# Patient Record
Sex: Male | Born: 1945
Health system: Southern US, Community
[De-identification: ages and names within clinical notes are randomized; demographics above are authoritative.]

## PROBLEM LIST (undated history)

## (undated) DIAGNOSIS — Z9289 Personal history of other medical treatment: Secondary | ICD-10-CM

## (undated) DIAGNOSIS — H269 Unspecified cataract: Secondary | ICD-10-CM

## (undated) DIAGNOSIS — R112 Nausea with vomiting, unspecified: Secondary | ICD-10-CM

## (undated) DIAGNOSIS — K5732 Diverticulitis of large intestine without perforation or abscess without bleeding: Secondary | ICD-10-CM

## (undated) DIAGNOSIS — H209 Unspecified iridocyclitis: Secondary | ICD-10-CM

## (undated) DIAGNOSIS — Z9889 Other specified postprocedural states: Secondary | ICD-10-CM

## (undated) DIAGNOSIS — R002 Palpitations: Secondary | ICD-10-CM

## (undated) DIAGNOSIS — N529 Male erectile dysfunction, unspecified: Secondary | ICD-10-CM

## (undated) DIAGNOSIS — Z8679 Personal history of other diseases of the circulatory system: Secondary | ICD-10-CM

## (undated) DIAGNOSIS — IMO0001 Reserved for inherently not codable concepts without codable children: Secondary | ICD-10-CM

## (undated) DIAGNOSIS — M1990 Unspecified osteoarthritis, unspecified site: Secondary | ICD-10-CM

## (undated) DIAGNOSIS — E785 Hyperlipidemia, unspecified: Secondary | ICD-10-CM

## (undated) DIAGNOSIS — H20029 Recurrent acute iridocyclitis, unspecified eye: Secondary | ICD-10-CM

## (undated) DIAGNOSIS — I10 Essential (primary) hypertension: Secondary | ICD-10-CM

## (undated) HISTORY — DX: Unspecified osteoarthritis, unspecified site: M19.90

## (undated) HISTORY — DX: Other specified postprocedural states: R11.2

## (undated) HISTORY — PX: APPENDECTOMY: SHX54

## (undated) HISTORY — DX: Palpitations: R00.2

## (undated) HISTORY — PX: TONSILLECTOMY: SUR1361

## (undated) HISTORY — DX: Unspecified iridocyclitis: H20.9

## (undated) HISTORY — DX: Unspecified cataract: H26.9

## (undated) HISTORY — PX: TONSILLECTOMY AND ADENOIDECTOMY: SHX28

## (undated) HISTORY — PX: OTHER SURGICAL HISTORY: SHX169

## (undated) HISTORY — DX: Reserved for inherently not codable concepts without codable children: IMO0001

## (undated) HISTORY — DX: Hyperlipidemia, unspecified: E78.5

## (undated) HISTORY — PX: SHOULDER ARTHROSCOPY: SHX128

## (undated) HISTORY — DX: Essential (primary) hypertension: I10

## (undated) HISTORY — DX: Personal history of other medical treatment: Z92.89

## (undated) HISTORY — DX: Male erectile dysfunction, unspecified: N52.9

## (undated) HISTORY — DX: Personal history of other diseases of the circulatory system: Z86.79

## (undated) HISTORY — PX: CATARACT EXTRACTION: SUR2

## (undated) HISTORY — DX: Diverticulitis of large intestine without perforation or abscess without bleeding: K57.32

## (undated) HISTORY — DX: Other specified postprocedural states: Z98.890

## (undated) HISTORY — DX: Recurrent acute iridocyclitis, unspecified eye: H20.029

---

## 1998-12-17 ENCOUNTER — Encounter: Payer: Self-pay | Admitting: Specialist

## 1998-12-17 ENCOUNTER — Ambulatory Visit (HOSPITAL_COMMUNITY): Admission: RE | Admit: 1998-12-17 | Discharge: 1998-12-17 | Payer: Self-pay | Admitting: Specialist

## 1999-10-08 ENCOUNTER — Encounter: Admission: RE | Admit: 1999-10-08 | Discharge: 1999-10-08 | Payer: Self-pay | Admitting: Family Medicine

## 1999-10-08 ENCOUNTER — Encounter: Payer: Self-pay | Admitting: Family Medicine

## 2000-03-06 ENCOUNTER — Encounter: Admission: RE | Admit: 2000-03-06 | Discharge: 2000-03-06 | Payer: Self-pay | Admitting: Family Medicine

## 2000-03-06 ENCOUNTER — Encounter: Payer: Self-pay | Admitting: Family Medicine

## 2002-09-27 ENCOUNTER — Encounter: Payer: Self-pay | Admitting: Family Medicine

## 2002-09-27 DIAGNOSIS — IMO0001 Reserved for inherently not codable concepts without codable children: Secondary | ICD-10-CM

## 2002-09-27 HISTORY — DX: Reserved for inherently not codable concepts without codable children: IMO0001

## 2004-09-02 ENCOUNTER — Ambulatory Visit: Payer: Self-pay | Admitting: Family Medicine

## 2004-09-09 ENCOUNTER — Ambulatory Visit: Payer: Self-pay | Admitting: Family Medicine

## 2004-10-29 ENCOUNTER — Ambulatory Visit (HOSPITAL_COMMUNITY): Admission: RE | Admit: 2004-10-29 | Discharge: 2004-10-29 | Payer: Self-pay | Admitting: Gastroenterology

## 2004-12-09 ENCOUNTER — Ambulatory Visit: Payer: Self-pay | Admitting: Family Medicine

## 2004-12-31 ENCOUNTER — Ambulatory Visit: Payer: Self-pay | Admitting: Family Medicine

## 2005-02-23 ENCOUNTER — Ambulatory Visit: Payer: Self-pay | Admitting: Family Medicine

## 2005-03-31 ENCOUNTER — Ambulatory Visit: Payer: Self-pay | Admitting: Family Medicine

## 2005-07-23 ENCOUNTER — Ambulatory Visit: Payer: Self-pay | Admitting: Family Medicine

## 2005-07-26 ENCOUNTER — Ambulatory Visit: Payer: Self-pay | Admitting: Family Medicine

## 2005-09-13 ENCOUNTER — Ambulatory Visit: Payer: Self-pay | Admitting: Family Medicine

## 2005-09-20 ENCOUNTER — Ambulatory Visit: Payer: Self-pay | Admitting: Family Medicine

## 2005-12-19 ENCOUNTER — Ambulatory Visit: Payer: Self-pay | Admitting: Family Medicine

## 2006-04-04 ENCOUNTER — Ambulatory Visit: Payer: Self-pay | Admitting: Family Medicine

## 2006-06-21 ENCOUNTER — Ambulatory Visit: Payer: Self-pay | Admitting: Family Medicine

## 2006-07-18 ENCOUNTER — Ambulatory Visit: Payer: Self-pay | Admitting: Family Medicine

## 2006-08-18 ENCOUNTER — Ambulatory Visit: Payer: Self-pay | Admitting: Family Medicine

## 2006-09-04 ENCOUNTER — Emergency Department (HOSPITAL_COMMUNITY): Admission: EM | Admit: 2006-09-04 | Discharge: 2006-09-04 | Payer: Self-pay | Admitting: Emergency Medicine

## 2006-09-12 LAB — HM COLONOSCOPY: HM Colonoscopy: NORMAL

## 2006-09-20 ENCOUNTER — Ambulatory Visit: Payer: Self-pay | Admitting: Family Medicine

## 2006-09-20 LAB — CONVERTED CEMR LAB
ALT: 65 units/L — ABNORMAL HIGH (ref 0–40)
AST: 39 units/L — ABNORMAL HIGH (ref 0–37)
Albumin: 4.2 g/dL (ref 3.5–5.2)
Alkaline Phosphatase: 68 units/L (ref 39–117)
BUN: 16 mg/dL (ref 6–23)
Basophils Absolute: 0 10*3/uL (ref 0.0–0.1)
Basophils Relative: 0.3 % (ref 0.0–1.0)
CO2: 31 meq/L (ref 19–32)
Calcium: 9 mg/dL (ref 8.4–10.5)
Chloride: 109 meq/L (ref 96–112)
Chol/HDL Ratio, serum: 6.7
Cholesterol: 258 mg/dL (ref 0–200)
Creatinine, Ser: 0.9 mg/dL (ref 0.4–1.5)
Eosinophil percent: 3.9 % (ref 0.0–5.0)
GFR calc non Af Amer: 91 mL/min
Glomerular Filtration Rate, Af Am: 110 mL/min/{1.73_m2}
Glucose, Bld: 140 mg/dL — ABNORMAL HIGH (ref 70–99)
HCT: 47.9 % (ref 39.0–52.0)
HDL: 38.7 mg/dL — ABNORMAL LOW (ref >39.0)
Hemoglobin: 16.2 g/dL (ref 13.0–17.0)
Hgb A1c MFr Bld: 7.1 % — ABNORMAL HIGH (ref 4.6–6.0)
LDL DIRECT: 148.9 mg/dL
Lymphocytes Relative: 29.1 % (ref 12.0–46.0)
MCHC: 33.7 g/dL (ref 30.0–36.0)
MCV: 95.3 fL (ref 78.0–100.0)
Monocytes Absolute: 0.5 10*3/uL (ref 0.2–0.7)
Monocytes Relative: 10.9 % (ref 3.0–11.0)
Neutro Abs: 2.7 10*3/uL (ref 1.4–7.7)
Neutrophils Relative %: 55.8 % (ref 43.0–77.0)
PSA: 0.75 ng/mL (ref 0.10–4.00)
Platelets: 177 10*3/uL (ref 150–400)
Potassium: 4.3 meq/L (ref 3.5–5.1)
RBC: 5.02 M/uL (ref 4.22–5.81)
RDW: 11.9 % (ref 11.5–14.6)
Sodium: 144 meq/L (ref 135–145)
TSH: 1.84 microintl units/mL (ref 0.35–5.50)
Total Bilirubin: 0.7 mg/dL (ref 0.3–1.2)
Total Protein: 6.9 g/dL (ref 6.0–8.3)
Triglyceride fasting, serum: 284 mg/dL (ref 0–149)
VLDL: 57 mg/dL — ABNORMAL HIGH (ref 0–40)
WBC: 4.8 10*3/uL (ref 4.5–10.5)

## 2006-09-27 ENCOUNTER — Ambulatory Visit: Payer: Self-pay | Admitting: Family Medicine

## 2006-10-20 ENCOUNTER — Encounter: Admission: RE | Admit: 2006-10-20 | Discharge: 2007-01-18 | Payer: Self-pay | Admitting: Family Medicine

## 2006-12-08 ENCOUNTER — Ambulatory Visit: Payer: Self-pay | Admitting: Internal Medicine

## 2007-01-22 ENCOUNTER — Ambulatory Visit: Payer: Self-pay | Admitting: Family Medicine

## 2007-01-22 LAB — CONVERTED CEMR LAB
Alkaline Phosphatase: 51 units/L (ref 39–117)
Bilirubin, Direct: 0.2 mg/dL (ref 0.0–0.3)
Cholesterol: 188 mg/dL (ref 0–200)
HDL: 39.4 mg/dL (ref >39.0)
LDL Cholesterol: 124 mg/dL — ABNORMAL HIGH (ref 0–99)
Total CHOL/HDL Ratio: 4.8
Total Protein: 6.4 g/dL (ref 6.0–8.3)

## 2007-03-05 DIAGNOSIS — M109 Gout, unspecified: Secondary | ICD-10-CM | POA: Insufficient documentation

## 2007-03-05 DIAGNOSIS — I10 Essential (primary) hypertension: Secondary | ICD-10-CM | POA: Insufficient documentation

## 2007-03-05 DIAGNOSIS — E785 Hyperlipidemia, unspecified: Secondary | ICD-10-CM | POA: Insufficient documentation

## 2007-03-05 DIAGNOSIS — Z860100 Personal history of colon polyps, unspecified: Secondary | ICD-10-CM | POA: Insufficient documentation

## 2007-03-05 DIAGNOSIS — N138 Other obstructive and reflux uropathy: Secondary | ICD-10-CM | POA: Insufficient documentation

## 2007-03-05 DIAGNOSIS — K5730 Diverticulosis of large intestine without perforation or abscess without bleeding: Secondary | ICD-10-CM | POA: Insufficient documentation

## 2007-03-05 DIAGNOSIS — N401 Enlarged prostate with lower urinary tract symptoms: Secondary | ICD-10-CM

## 2007-04-19 ENCOUNTER — Telehealth: Payer: Self-pay | Admitting: Family Medicine

## 2007-04-20 ENCOUNTER — Ambulatory Visit: Payer: Self-pay | Admitting: Family Medicine

## 2007-04-24 ENCOUNTER — Telehealth: Payer: Self-pay | Admitting: Family Medicine

## 2007-04-24 LAB — CONVERTED CEMR LAB
ALT: 65 units/L — ABNORMAL HIGH (ref 0–53)
AST: 46 units/L — ABNORMAL HIGH (ref 0–37)
Bilirubin, Direct: 0.2 mg/dL (ref 0.0–0.3)
Cholesterol: 169 mg/dL (ref 0–200)
HDL: 38.9 mg/dL — ABNORMAL LOW (ref >39.0)
Total Bilirubin: 1.4 mg/dL — ABNORMAL HIGH (ref 0.3–1.2)
Total Protein: 6.5 g/dL (ref 6.0–8.3)

## 2007-07-19 ENCOUNTER — Ambulatory Visit: Payer: Self-pay | Admitting: Family Medicine

## 2007-09-04 ENCOUNTER — Ambulatory Visit: Payer: Self-pay | Admitting: Family Medicine

## 2007-09-04 LAB — CONVERTED CEMR LAB
Nitrite: NEGATIVE
Urobilinogen, UA: 0.2

## 2007-09-21 ENCOUNTER — Ambulatory Visit: Payer: Self-pay | Admitting: Family Medicine

## 2007-09-21 LAB — CONVERTED CEMR LAB
Blood in Urine, dipstick: NEGATIVE
Ketones, urine, test strip: NEGATIVE
Urobilinogen, UA: 0.2
pH: 6.5

## 2007-09-24 LAB — CONVERTED CEMR LAB
ALT: 75 units/L — ABNORMAL HIGH (ref 0–53)
AST: 48 units/L — ABNORMAL HIGH (ref 0–37)
Alkaline Phosphatase: 67 units/L (ref 39–117)
BUN: 14 mg/dL (ref 6–23)
Basophils Relative: 0.1 % (ref 0.0–1.0)
CO2: 29 meq/L (ref 19–32)
Calcium: 9.3 mg/dL (ref 8.4–10.5)
Chloride: 96 meq/L (ref 96–112)
Cholesterol: 227 mg/dL (ref 0–200)
Creatinine, Ser: 0.8 mg/dL (ref 0.4–1.5)
Hemoglobin: 16.1 g/dL (ref 13.0–17.0)
Monocytes Absolute: 0.6 10*3/uL (ref 0.2–0.7)
Monocytes Relative: 10.5 % (ref 3.0–11.0)
Potassium: 5.1 meq/L (ref 3.5–5.1)
RBC: 4.82 M/uL (ref 4.22–5.81)
RDW: 12.3 % (ref 11.5–14.6)
Total Bilirubin: 1.2 mg/dL (ref 0.3–1.2)
Total CHOL/HDL Ratio: 6.5
Total Protein: 6.6 g/dL (ref 6.0–8.3)
VLDL: 28 mg/dL (ref 0–40)

## 2007-09-27 ENCOUNTER — Ambulatory Visit: Payer: Self-pay | Admitting: Family Medicine

## 2007-12-04 ENCOUNTER — Ambulatory Visit: Payer: Self-pay | Admitting: Family Medicine

## 2007-12-04 LAB — CONVERTED CEMR LAB
Blood in Urine, dipstick: NEGATIVE
Ketones, urine, test strip: NEGATIVE
Nitrite: NEGATIVE
Protein, U semiquant: NEGATIVE
Urobilinogen, UA: 0.2

## 2007-12-10 ENCOUNTER — Telehealth: Payer: Self-pay | Admitting: Family Medicine

## 2007-12-10 ENCOUNTER — Ambulatory Visit: Payer: Self-pay | Admitting: Family Medicine

## 2007-12-10 LAB — CONVERTED CEMR LAB
Bilirubin Urine: NEGATIVE
Glucose, Urine, Semiquant: NEGATIVE
Ketones, urine, test strip: NEGATIVE
Protein, U semiquant: NEGATIVE
Urobilinogen, UA: 0.2
pH: 6.5

## 2007-12-14 ENCOUNTER — Telehealth: Payer: Self-pay | Admitting: Family Medicine

## 2007-12-14 ENCOUNTER — Encounter: Admission: RE | Admit: 2007-12-14 | Discharge: 2007-12-14 | Payer: Self-pay | Admitting: Family Medicine

## 2007-12-20 ENCOUNTER — Ambulatory Visit: Payer: Self-pay | Admitting: Family Medicine

## 2007-12-28 LAB — CONVERTED CEMR LAB
ALT: 43 units/L (ref 0–53)
Albumin: 4.2 g/dL (ref 3.5–5.2)
Alkaline Phosphatase: 60 units/L (ref 39–117)
Cholesterol: 116 mg/dL (ref 0–200)
LDL Cholesterol: 72 mg/dL (ref 0–99)
Total Protein: 6.6 g/dL (ref 6.0–8.3)
Triglycerides: 95 mg/dL (ref 0–149)
VLDL: 19 mg/dL (ref 0–40)

## 2008-04-03 ENCOUNTER — Encounter: Payer: Self-pay | Admitting: Family Medicine

## 2008-04-11 ENCOUNTER — Telehealth: Payer: Self-pay | Admitting: Family Medicine

## 2008-04-14 ENCOUNTER — Ambulatory Visit: Payer: Self-pay | Admitting: Family Medicine

## 2008-05-29 ENCOUNTER — Telehealth: Payer: Self-pay | Admitting: Family Medicine

## 2008-06-01 DIAGNOSIS — M65839 Other synovitis and tenosynovitis, unspecified forearm: Secondary | ICD-10-CM | POA: Insufficient documentation

## 2008-06-01 DIAGNOSIS — M65849 Other synovitis and tenosynovitis, unspecified hand: Secondary | ICD-10-CM

## 2008-06-05 ENCOUNTER — Ambulatory Visit: Payer: Self-pay | Admitting: Family Medicine

## 2008-06-06 ENCOUNTER — Telehealth: Payer: Self-pay | Admitting: Family Medicine

## 2008-06-06 ENCOUNTER — Ambulatory Visit: Payer: Self-pay | Admitting: Family Medicine

## 2008-06-10 LAB — CONVERTED CEMR LAB
AST: 28 units/L (ref 0–37)
Albumin: 4.1 g/dL (ref 3.5–5.2)
Alkaline Phosphatase: 54 units/L (ref 39–117)
Bilirubin, Direct: 0.2 mg/dL (ref 0.0–0.3)
LDL Cholesterol: 101 mg/dL — ABNORMAL HIGH (ref 0–99)
Total CHOL/HDL Ratio: 4.1
Triglycerides: 95 mg/dL (ref 0–149)

## 2008-07-29 ENCOUNTER — Encounter: Payer: Self-pay | Admitting: Family Medicine

## 2008-08-08 ENCOUNTER — Ambulatory Visit: Payer: Self-pay | Admitting: Family Medicine

## 2008-08-13 ENCOUNTER — Encounter: Payer: Self-pay | Admitting: Family Medicine

## 2008-10-02 ENCOUNTER — Ambulatory Visit: Payer: Self-pay | Admitting: Family Medicine

## 2008-10-02 LAB — CONVERTED CEMR LAB
Bilirubin Urine: NEGATIVE
Blood in Urine, dipstick: NEGATIVE
Ketones, urine, test strip: NEGATIVE
Nitrite: NEGATIVE
Protein, U semiquant: NEGATIVE
Urobilinogen, UA: 0.2

## 2008-10-03 LAB — CONVERTED CEMR LAB
ALT: 43 units/L (ref 0–53)
Alkaline Phosphatase: 57 units/L (ref 39–117)
Basophils Absolute: 0 10*3/uL (ref 0.0–0.1)
Bilirubin, Direct: 0.1 mg/dL (ref 0.0–0.3)
CO2: 27 meq/L (ref 19–32)
Calcium: 8.9 mg/dL (ref 8.4–10.5)
Cholesterol: 121 mg/dL (ref 0–200)
Glucose, Bld: 129 mg/dL — ABNORMAL HIGH (ref 70–99)
HDL: 39.6 mg/dL (ref >39.0)
Hemoglobin: 14.6 g/dL (ref 13.0–17.0)
LDL Cholesterol: 70 mg/dL (ref 0–99)
Lymphocytes Relative: 26.4 % (ref 12.0–46.0)
MCHC: 34 g/dL (ref 30.0–36.0)
Microalb Creat Ratio: 9.8 mg/g (ref 0.0–30.0)
Microalb, Ur: 1.8 mg/dL (ref 0.0–1.9)
Monocytes Relative: 8 % (ref 3.0–12.0)
Neutro Abs: 3.6 10*3/uL (ref 1.4–7.7)
Neutrophils Relative %: 61.2 % (ref 43.0–77.0)
PSA: 1.11 ng/mL (ref 0.10–4.00)
Platelets: 149 10*3/uL — ABNORMAL LOW (ref 150–400)
Potassium: 4.4 meq/L (ref 3.5–5.1)
RDW: 12.5 % (ref 11.5–14.6)
Sodium: 139 meq/L (ref 135–145)
TSH: 1.14 microintl units/mL (ref 0.35–5.50)
Total Bilirubin: 1.2 mg/dL (ref 0.3–1.2)
Total CHOL/HDL Ratio: 3.1
Triglycerides: 59 mg/dL (ref 0–149)
VLDL: 12 mg/dL (ref 0–40)

## 2008-10-09 ENCOUNTER — Ambulatory Visit: Payer: Self-pay | Admitting: Family Medicine

## 2008-11-07 ENCOUNTER — Ambulatory Visit: Payer: Self-pay | Admitting: Family Medicine

## 2008-12-16 ENCOUNTER — Telehealth: Payer: Self-pay | Admitting: Family Medicine

## 2009-01-14 ENCOUNTER — Ambulatory Visit: Payer: Self-pay | Admitting: Family Medicine

## 2009-01-15 ENCOUNTER — Encounter: Payer: Self-pay | Admitting: Family Medicine

## 2009-01-15 LAB — CONVERTED CEMR LAB
Alkaline Phosphatase: 57 units/L (ref 39–117)
Bilirubin, Direct: 0.1 mg/dL (ref 0.0–0.3)
Cholesterol: 132 mg/dL (ref 0–200)
LDL Cholesterol: 74 mg/dL (ref 0–99)
Total Bilirubin: 1 mg/dL (ref 0.3–1.2)
Total CHOL/HDL Ratio: 4
Total Protein: 6.6 g/dL (ref 6.0–8.3)
VLDL: 26.6 mg/dL (ref 0.0–40.0)

## 2009-02-19 ENCOUNTER — Encounter: Payer: Self-pay | Admitting: Family Medicine

## 2009-05-01 ENCOUNTER — Encounter: Payer: Self-pay | Admitting: Family Medicine

## 2009-05-05 ENCOUNTER — Ambulatory Visit: Payer: Self-pay | Admitting: Family Medicine

## 2009-05-06 LAB — CONVERTED CEMR LAB
AST: 28 units/L (ref 0–37)
Albumin: 4 g/dL (ref 3.5–5.2)
Alkaline Phosphatase: 53 units/L (ref 39–117)
BUN: 25 mg/dL — ABNORMAL HIGH (ref 6–23)
Basophils Relative: 0.1 % (ref 0.0–3.0)
Creatinine, Ser: 1.1 mg/dL (ref 0.4–1.5)
Eosinophils Absolute: 0.2 10*3/uL (ref 0.0–0.7)
Eosinophils Relative: 4.4 % (ref 0.0–5.0)
Glucose, Bld: 161 mg/dL — ABNORMAL HIGH (ref 70–99)
HCT: 42.5 % (ref 39.0–52.0)
Hemoglobin: 14.8 g/dL (ref 13.0–17.0)
Lymphs Abs: 1 10*3/uL (ref 0.7–4.0)
MCHC: 34.9 g/dL (ref 30.0–36.0)
MCV: 97.3 fL (ref 78.0–100.0)
Monocytes Absolute: 0.5 10*3/uL (ref 0.1–1.0)
Neutro Abs: 3 10*3/uL (ref 1.4–7.7)
Neutrophils Relative %: 62.9 % (ref 43.0–77.0)
Potassium: 5.2 meq/L — ABNORMAL HIGH (ref 3.5–5.1)
RBC: 4.37 M/uL (ref 4.22–5.81)
WBC: 4.7 10*3/uL (ref 4.5–10.5)

## 2009-07-07 ENCOUNTER — Ambulatory Visit: Payer: Self-pay | Admitting: Family Medicine

## 2009-07-10 ENCOUNTER — Encounter: Payer: Self-pay | Admitting: Family Medicine

## 2009-07-28 ENCOUNTER — Ambulatory Visit: Payer: Self-pay | Admitting: Family Medicine

## 2009-07-29 LAB — CONVERTED CEMR LAB: Hgb A1c MFr Bld: 6.3 % (ref 4.6–6.5)

## 2009-10-06 ENCOUNTER — Ambulatory Visit: Payer: Self-pay | Admitting: Family Medicine

## 2009-10-06 LAB — CONVERTED CEMR LAB
Blood in Urine, dipstick: NEGATIVE
Glucose, Urine, Semiquant: NEGATIVE
Specific Gravity, Urine: 1.02
WBC Urine, dipstick: NEGATIVE
pH: 6

## 2009-10-08 LAB — CONVERTED CEMR LAB
AST: 33 units/L (ref 0–37)
Albumin: 4.1 g/dL (ref 3.5–5.2)
BUN: 18 mg/dL (ref 6–23)
Basophils Absolute: 0 10*3/uL (ref 0.0–0.1)
CO2: 28 meq/L (ref 19–32)
Cholesterol: 145 mg/dL (ref 0–200)
Creatinine,U: 214.8 mg/dL
Eosinophils Absolute: 0.3 10*3/uL (ref 0.0–0.7)
Glucose, Bld: 159 mg/dL — ABNORMAL HIGH (ref 70–99)
HCT: 44.9 % (ref 39.0–52.0)
HDL: 44.9 mg/dL (ref >39.00)
Hemoglobin: 15.1 g/dL (ref 13.0–17.0)
Hgb A1c MFr Bld: 6.4 % (ref 4.6–6.5)
Lymphs Abs: 1.2 10*3/uL (ref 0.7–4.0)
MCHC: 33.6 g/dL (ref 30.0–36.0)
Microalb, Ur: 1.5 mg/dL (ref 0.0–1.9)
Monocytes Absolute: 0.4 10*3/uL (ref 0.1–1.0)
Neutro Abs: 3.1 10*3/uL (ref 1.4–7.7)
Potassium: 4.6 meq/L (ref 3.5–5.1)
RDW: 12.6 % (ref 11.5–14.6)
Sodium: 138 meq/L (ref 135–145)
TSH: 1.18 microintl units/mL (ref 0.35–5.50)

## 2009-10-13 ENCOUNTER — Ambulatory Visit: Payer: Self-pay | Admitting: Family Medicine

## 2009-10-16 ENCOUNTER — Encounter: Admission: RE | Admit: 2009-10-16 | Discharge: 2009-10-16 | Payer: Self-pay | Admitting: Family Medicine

## 2009-10-20 ENCOUNTER — Telehealth: Payer: Self-pay | Admitting: Family Medicine

## 2009-10-22 ENCOUNTER — Telehealth: Payer: Self-pay | Admitting: Family Medicine

## 2010-03-24 ENCOUNTER — Telehealth: Payer: Self-pay | Admitting: Family Medicine

## 2010-04-19 ENCOUNTER — Ambulatory Visit: Payer: Self-pay | Admitting: Family Medicine

## 2010-04-19 LAB — CONVERTED CEMR LAB
Bilirubin Urine: NEGATIVE
Protein, U semiquant: NEGATIVE
Urobilinogen, UA: 0.2

## 2010-04-21 LAB — CONVERTED CEMR LAB: Hgb A1c MFr Bld: 7 % — ABNORMAL HIGH (ref 4.6–6.5)

## 2010-05-17 ENCOUNTER — Emergency Department (HOSPITAL_COMMUNITY): Admission: EM | Admit: 2010-05-17 | Discharge: 2010-05-17 | Payer: Self-pay | Admitting: Family Medicine

## 2010-10-09 ENCOUNTER — Encounter: Payer: Self-pay | Admitting: Family Medicine

## 2010-10-12 NOTE — Progress Notes (Signed)
Summary: glipizide  Phone Note From Pharmacy   Caller: maria,[phone] Call For: Gavriel Holzhauer  Summary of Call: Calling to verify RX Glipizide 10mg  TB24 1/2 two times a day.  This was two times a day.  Did doctor decreasse dose to 1 tab daily?  This med TB 24 should not be cut, because it can releasse all med immediately.  Glipizide ER 5mg  is available for two times a day dosing.  Ref # [id number]. Initial call taken by: Rudy Jew, RN,  October 20, 2009 4:59 PM  Follow-up for Phone Call        change to Glipizide ER 5 mg two times a day . Give 90 days with 3 rf Follow-up by: Nelwyn Salisbury MD,  October 20, 2009 5:27 PM  Additional Follow-up for Phone Call Additional follow up Details #1::        Phone call completed, Pharmacist called Additional Follow-up by: Alfred Levins, CMA,  October 21, 2009 2:18 PM    New/Updated Medications: GLIPIZIDE XL 10 MG XR24H-TAB (GLIPIZIDE)  GLIPIZIDE 5 MG XR24H-TAB (GLIPIZIDE) 1 by mouth two times a day Prescriptions: GLIPIZIDE 5 MG XR24H-TAB (GLIPIZIDE) 1 by mouth two times a day  #90 x 3   Entered by:   Alfred Levins, CMA   Authorized by:   Nelwyn Salisbury MD   Signed by:   Alfred Levins, CMA on 10/21/2009   Method used:   Faxed to ...       [facility] (mail-order)             , [place]         Ph: [phone]       Fax: [phone]   RxID:   [id number]

## 2010-10-12 NOTE — Assessment & Plan Note (Signed)
Summary: CPX // RS   Vital Signs:  Patient profile:   65 year old male Height:      73.25 inches Weight:      212 pounds Temp:     99.2 degrees F oral Pulse rate:   102 / minute BP sitting:   124 / 72  (left arm) Cuff size:   large  Vitals Entered By: Alfred Levins, CMA (October 13, 2009 2:43 PM) CC: cpx   History of Present Illness: 65 yr old male for a cpx. he has several issues to talk about. First of all, he asks if he can try  a medication for stress. 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 He feels anxous at times, cannot relax, tends to lose his temper quickly. He tried a few Lexapro tablets tha this wife has at home, but he did not feel any different. he does sleep well as wlong as he takes Ambien. Also on one occasion about a month ago he noticed some blood in his semen. He had no other symptoms, and this has not happened since. Lastly he has noticed some slight tingling or numbness in the 4th and 5th toes of the left foot for several months. This does not really bother him, but he is curious about it.   Current Medications (verified): 1)  Aspir-Low 81 Mg Tbec (Aspirin) .Marland Kitchen.. 1 Once Daily After Meal 2)  Lisinopril 20 Mg Tabs (Lisinopril) .Marland Kitchen.. 1 By Mouth Once Daily 3)  Zyloprim 300 Mg  Tabs (Allopurinol) .Marland Kitchen.. 1 By Mouth Once Daily 4)  Ambien 10 Mg  Tabs (Zolpidem Tartrate) .Marland Kitchen.. 1 By Mouth Once Daily 5)  Crestor 20 Mg  Tabs (Rosuvastatin Calcium) .... Once Daily 6)  Glipizide 10 Mg  Tb24 (Glipizide) .... 1/2 Tab Two Times A Day 7)  Folic Acid 1 Mg Tabs (Folic Acid) .... Once Daily 8)  Methotrexate 2.5 Mg Tabs (Methotrexate Sodium) .... Take 3 Tabs On Sunday 9)  Etodolac 500 Mg Tabs (Etodolac) .... Two Times A Day 10)  Eye Drops .... As Needed  Allergies (verified): No Known Drug Allergies  Past History:  Past Medical History: Reviewed history from 08/08/2008 and no changes  required. Gout Diverticulosis, colon Hyperlipidemia Hypertension Diabetes mellitus, type II ED normal cardiac stress test 09-27-02 uveitis, sees Dr. Peggyann Shoals at [facility]  Past Surgical History: Reviewed history from 09/27/2007 and no changes required. Appendectomy Tonsillectomy Arthroscopic both shoulders, per Dr. Thomasena Edis colonoscopy 2007 per Dr. Sherin Quarry, repeat 5 yrs  Family History: Reviewed history from 09/04/2007 and no changes required. Unremarkable  Social History: Reviewed history from 09/04/2007 and no changes required. Married Former Smoker Alcohol use-yes Drug use-no  Review of Systems  The patient denies anorexia, fever, weight loss, weight gain, vision loss, decreased hearing, hoarseness, chest pain, syncope, dyspnea on exertion, peripheral edema, prolonged cough, headaches, hemoptysis, abdominal pain, melena, hematochezia, severe indigestion/heartburn, hematuria, incontinence, genital sores, muscle weakness, suspicious skin lesions, transient blindness, difficulty walking, depression, unusual weight change, abnormal bleeding, enlarged lymph nodes, angioedema, breast masses, and testicular masses.    Physical Exam  General:  Well-developed,well-nourished,in no acute distress; alert,appropriate and cooperative throughout examination Head:  Normocephalic and atraumatic without obvious abnormalities. No apparent alopecia or balding. Eyes:  No corneal or conjunctival inflammation noted. EOMI. Perrla. Funduscopic exam benign, without hemorrhages, exudates or papilledema. Vision grossly normal. Ears:  External ear exam shows no significant lesions or deformities.  Otoscopic examination reveals clear canals, tympanic membranes are intact bilaterally without bulging, retraction, inflammation or discharge. Hearing is grossly normal bilaterally. Nose:  External nasal examination shows no deformity or inflammation. Nasal mucosa are pink and moist without lesions or  exudates. Mouth:  Oral mucosa and oropharynx without lesions or exudates.  Teeth in good repair. Neck:  No deformities, masses, or tenderness noted. Chest Wall:  No deformities, masses, tenderness or gynecomastia noted. Lungs:  Normal respiratory effort, chest expands symmetrically. Lungs are clear to auscultation, no crackles or wheezes. Heart:  Normal rate and regular rhythm. S1 and S2 normal without gallop, murmur, click, rub or other extra sounds. EKG normal Abdomen:  Bowel sounds positive,abdomen soft and non-tender without masses, organomegaly or hernias noted. Rectal:  No external abnormalities noted. Normal sphincter tone. No rectal masses or tenderness. Heme neg Genitalia:  testes are normal with no lumps or tenderness. The left scrotum has 2 rounded, nontender cystic structures just superior to the testicle.  Prostate:  Prostate gland firm and smooth, no enlargement, nodularity, tenderness, mass, asymmetry or induration. Msk:  No deformity or scoliosis noted of thoracic or lumbar spine.   Pulses:  R and L carotid,radial,femoral,dorsalis pedis and posterior tibial pulses are full and equal bilaterally Extremities:  No clubbing, cyanosis, edema, or deformity noted with normal full range of motion of all joints.   Neurologic:  No cranial nerve deficits noted. Station and gait are normal. Plantar reflexes are down-going bilaterally. DTRs are symmetrical throughout. Sensory, motor and coordinative functions appear intact. Skin:  Intact without suspicious lesions or rashes Cervical Nodes:  No lymphadenopathy noted Axillary Nodes:  No palpable lymphadenopathy Inguinal Nodes:  No significant adenopathy Psych:  Cognition and judgment appear intact. Alert and cooperative with normal attention span and concentration. No apparent delusions, illusions, hallucinations   Impression & Recommendations:  Problem # 1:  PHYSICAL EXAMINATION (ICD-V70.0)  Orders: Hemoccult Guaiac-1 spec.(in office)  (82270) EKG w/ Interpretation (93000)  Complete Medication List: 1)  Aspir-low 81 Mg Tbec (Aspirin) .Marland Kitchen.. 1 once daily after meal 2)  Lisinopril 20 Mg Tabs (Lisinopril) .Marland Kitchen.. 1 by mouth once daily 3)  Zyloprim 300 Mg Tabs (Allopurinol) .Marland Kitchen.. 1 by mouth once daily 4)  Ambien 10 Mg Tabs (Zolpidem tartrate) .Marland Kitchen.. 1 by mouth once daily 5)  Crestor 20 Mg Tabs (Rosuvastatin calcium) .... Once daily 6)  Glipizide 10 Mg Tb24 (Glipizide) .... 1/2 tab two times a day 7)  Folic Acid 1 Mg Tabs (Folic acid) .... Once daily 8)  Methotrexate 2.5 Mg Tabs (Methotrexate sodium) .... Take 3 tabs on sunday 9)  Eye Drops  .... As needed 10)  Lexapro 10 Mg Tabs (Escitalopram oxalate) .... Once daily  Other Orders: Radiology Referral (Radiology)  Patient Instructions: 1)  he seems to have several hydroceles in the left scrotum, so we will set up an [place] soon to evaluate. I do not think this is related to the episode of hematospermia he had, and I reassured him this this condition is almost always benign. he will let me know if this happens again. He is under a lot of stress, so we will start him on Lexapro. The numbness in the left toes could be from something like tarsal tunnel, but I suspect it is from a peripheral neuropathy. The diabetes would be the most likely source, even thiugh we are doing a good job of controlling it. he will observe for now and let me know if it gets worse.  2)  Please schedule a  follow-up appointment in 1 month.  Prescriptions: LEXAPRO 10 MG TABS (ESCITALOPRAM OXALATE) once daily  #30 x 2   Entered and Authorized by:   Nelwyn Salisbury MD   Signed by:   Nelwyn Salisbury MD on 10/13/2009   Method used:   Electronically to        [facility]* (retail)       [address]       [place], [place]  [address]       Ph: [phone] or [phone]       Fax: [phone]   RxID:   [id number] GLIPIZIDE 10 MG  TB24 (GLIPIZIDE) 1/2 tab two times a day  #180 x 3    Entered and Authorized by:   Nelwyn Salisbury MD   Signed by:   Nelwyn Salisbury MD on 10/13/2009   Method used:   Print then Give to Patient   RxID:   [id number] CRESTOR 20 MG  TABS (ROSUVASTATIN CALCIUM) once daily  #90 x 3   Entered and Authorized by:   Nelwyn Salisbury MD   Signed by:   Nelwyn Salisbury MD on 10/13/2009   Method used:   Print then Give to Patient   RxID:   [id number] AMBIEN 10 MG  TABS (ZOLPIDEM TARTRATE) 1 by mouth once daily  #90 x 1   Entered and Authorized by:   Nelwyn Salisbury MD   Signed by:   Nelwyn Salisbury MD on 10/13/2009   Method used:   Print then Give to Patient   RxID:   [id number] ZYLOPRIM 300 MG  TABS (ALLOPURINOL) 1 by mouth once daily  #90 x 3   Entered and Authorized by:   Nelwyn Salisbury MD   Signed by:   Nelwyn Salisbury MD on 10/13/2009   Method used:   Print then Give to Patient   RxID:   [id number] LISINOPRIL 20 MG TABS (LISINOPRIL) 1 by mouth once daily  #90 x 3   Entered and Authorized by:   Nelwyn Salisbury MD   Signed by:   Nelwyn Salisbury MD on 10/13/2009   Method used:   Print then Give to Patient   RxID:   [id number]   Preventive Care Screening  Colonoscopy:    Date:  09/12/2006    Next Due:  09/2011    Results:  normal

## 2010-10-12 NOTE — Progress Notes (Signed)
Summary: [employer] questioning prescriptio  Phone Note From Pharmacy   Caller: Madilyn Hook Dr. [phone]* Call For: Dr. Clent Ridges  Summary of Call: Pharmacy [facility]) does not feel comfortable having patient cut Glipizide in half. Suggsest ER 5 mg Glipizide [phone] reference  [id number] Original order cancelled by 5 pm today.  Initial call taken by: Lynann Beaver CMA,  October 22, 2009 9:58 AM  Follow-up for Phone Call        we did this yesterday Follow-up by: Nelwyn Salisbury MD,  October 22, 2009 12:16 PM

## 2010-10-12 NOTE — Assessment & Plan Note (Signed)
Summary: ?KIDNEY INF/CJR  u  Vital Signs:  Patient profile:   65 year old [name] Weight:      218 pounds BMI:     28.67 Temp:     97.9 degrees F oral BP sitting:   122 / 76  (left arm) Cuff size:   regular  Vitals Entered By: Raechel Ache, RN (April 19, 2010 3:02 PM) CC: C/o LBP and some dysuria.   History of Present Illness: here for 2 reasons. First for one week he has had mild urinary burning and urgency, and this feels like the prostate infeciton he gets every several years. No fever or nasuea. BMs are normal. Drinking plenty of fluids. Also he needs an A1c check again.   Allergies (verified): No Known Drug Allergies  Past History:  Past Medical History: Reviewed history from 08/08/2008 and no changes required. Gout Diverticulosis, colon Hyperlipidemia Hypertension Diabetes mellitus, type II ED normal cardiac stress test 09-27-02 uveitis, sees Dr. Peggyann Shoals at [facility]  Past Surgical History: Appendectomy Tonsillectomy Arthroscopic both shoulders, per Dr. Thomasena Edis colonoscopy 2007 per Dr. Sherin Quarry, repeat 5 yrs Cataract extraction per Dr. Dione Booze  Review of Systems  The patient denies anorexia, fever, weight loss, weight gain, vision loss, decreased hearing, hoarseness, chest pain, syncope, dyspnea on exertion, peripheral edema, prolonged cough, headaches, hemoptysis, abdominal pain, melena, hematochezia, severe indigestion/heartburn, hematuria, incontinence, genital sores, muscle weakness, suspicious skin lesions, transient blindness, difficulty walking, depression, unusual weight change, abnormal bleeding, enlarged lymph nodes, angioedema, breast masses, and testicular masses.    Physical Exam  General:  Well-developed,well-nourished,in no acute distress; alert,appropriate and cooperative throughout examination Lungs:  Normal respiratory effort, chest expands symmetrically. Lungs are clear to auscultation, no crackles or wheezes. Heart:  Normal rate and regular  rhythm. S1 and S2 normal without gallop, murmur, click, rub or other extra sounds. Abdomen:  Bowel sounds positive,abdomen soft and non-tender without masses, organomegaly or hernias noted.   Impression & Recommendations:  Problem # 1:  PROSTATITIS, ACUTE (ICD-601.0)  Orders: UA Dipstick w/o Micro (manual) (16[address])  Problem # 2:  DIABETES MELLITUS, TYPE II (ICD-250.00)  His updated medication list for this problem includes:    Aspir-low 81 Mg Tbec (Aspirin) .Marland Kitchen... 1 once daily after meal    Lisinopril 20 Mg Tabs (Lisinopril) .Marland Kitchen... 1 by mouth once daily    Glipizide 5 Mg Xr24h-tab (Glipizide) .Marland Kitchen... 1 by mouth two times a day  Orders: Venipuncture (60454) TLB-A1C / Hgb A1C (Glycohemoglobin) (83036-A1C)  Complete Medication List: 1)  Aspir-low 81 Mg Tbec (Aspirin) .Marland Kitchen.. 1 once daily after meal 2)  Lisinopril 20 Mg Tabs (Lisinopril) .Marland Kitchen.. 1 by mouth once daily 3)  Zyloprim 300 Mg Tabs (Allopurinol) .Marland Kitchen.. 1 by mouth once daily 4)  Ambien 10 Mg Tabs (Zolpidem tartrate) .Marland Kitchen.. 1 by mouth once daily 5)  Crestor 20 Mg Tabs (Rosuvastatin calcium) .... Once daily 6)  Glipizide 5 Mg Xr24h-tab (Glipizide) .Marland Kitchen.. 1 by mouth two times a day [name])  Folic Acid 1 Mg Tabs (Folic acid) .... Once daily 8)  Methotrexate 2.5 Mg Tabs (Methotrexate sodium) .... Take 3 tabs on sunday 9)  Eye Drops  .... As needed 10)  Lexapro 10 Mg Tabs (Escitalopram oxalate) .... Once daily 11)  Cipro 500 Mg Tabs (Ciprofloxacin hcl) .... Two times a day  Patient Instructions: 1)  check an A1c today Prescriptions: CIPRO 500 MG TABS (CIPROFLOXACIN HCL) two times a day  #42 x 0   Entered and Authorized by:   Tera Mater  Clent Ridges MD   Signed by:   Nelwyn Salisbury MD on 04/19/2010   Method used:   Electronically to        [facility] (retail)       [address]       [place], [place]  [address]       Ph: [phone] or [phone]       Fax: [phone]   RxID:   [id number]   Laboratory  Results   Urine Tests    Routine Urinalysis   Color: yellow Appearance: Clear Glucose: negative   (Normal Range: Negative) Bilirubin: negative   (Normal Range: Negative) Ketone: negative   (Normal Range: Negative) Spec. Gravity: 1.015   (Normal Range: 1.003-1.035) Blood: negative   (Normal Range: Negative) pH: 6.5   (Normal Range: 5.0-8.0) Protein: negative   (Normal Range: Negative) Urobilinogen: 0.2   (Normal Range: 0-1) Nitrite: negative   (Normal Range: Negative) Leukocyte Esterace: negative   (Normal Range: Negative)

## 2010-10-12 NOTE — Progress Notes (Signed)
Summary: refill Ambien  Phone Note Refill Request Message from:  Fax from Pharmacy on March 24, 2010 11:45 AM  Refills Requested: Medication #1:  AMBIEN 10 MG  TABS 1 by mouth once daily   Dosage confirmed as above?Dosage Confirmed   Supply Requested: 3 months   Last Refilled: 01/20/2010  Method Requested: Fax to Local Pharmacy Initial call taken by: Raechel Ache, RN,  March 24, 2010 11:46 AM Caller: [facility] home delivery  Follow-up for Phone Call        call in #90 with 1 rf Follow-up by: Nelwyn Salisbury MD,  March 24, 2010 6:03 PM  Additional Follow-up for Phone Call Additional follow up Details #1::        Rx faxed to pharmacy Additional Follow-up by: Raechel Ache, RN,  March 25, 2010 8:12 AM    Prescriptions: Remus Loffler 10 MG  TABS (ZOLPIDEM TARTRATE) 1 by mouth once daily  #90 x 1   Entered by:   Raechel Ache, RN   Authorized by:   Nelwyn Salisbury MD   Signed by:   Raechel Ache, RN on 03/25/2010   Method used:   Historical   RxID:   [id number]

## 2010-10-14 ENCOUNTER — Encounter (INDEPENDENT_AMBULATORY_CARE_PROVIDER_SITE_OTHER): Payer: Self-pay | Admitting: *Deleted

## 2010-10-14 ENCOUNTER — Other Ambulatory Visit: Payer: Medicare HMO

## 2010-10-14 ENCOUNTER — Other Ambulatory Visit: Payer: Self-pay

## 2010-10-14 ENCOUNTER — Ambulatory Visit: Admit: 2010-10-14 | Payer: Self-pay | Admitting: Family Medicine

## 2010-10-14 ENCOUNTER — Other Ambulatory Visit: Payer: Self-pay | Admitting: Family Medicine

## 2010-10-14 DIAGNOSIS — E785 Hyperlipidemia, unspecified: Secondary | ICD-10-CM

## 2010-10-14 DIAGNOSIS — Z0000 Encounter for general adult medical examination without abnormal findings: Secondary | ICD-10-CM

## 2010-10-14 DIAGNOSIS — Z125 Encounter for screening for malignant neoplasm of prostate: Secondary | ICD-10-CM

## 2010-10-14 DIAGNOSIS — I10 Essential (primary) hypertension: Secondary | ICD-10-CM

## 2010-10-14 LAB — BASIC METABOLIC PANEL
CO2: 28 mEq/L (ref 19–32)
Chloride: 100 mEq/L (ref 96–112)
Sodium: 134 mEq/L — ABNORMAL LOW (ref 135–145)

## 2010-10-14 LAB — CBC WITH DIFFERENTIAL/PLATELET
Basophils Relative: 0.6 % (ref 0.0–3.0)
Eosinophils Absolute: 0.2 10*3/uL (ref 0.0–0.7)
HCT: 45.7 % (ref 39.0–52.0)
Hemoglobin: 16.4 g/dL (ref 13.0–17.0)
Lymphocytes Relative: 22.5 % (ref 12.0–46.0)
Lymphs Abs: 1.1 10*3/uL (ref 0.7–4.0)
MCHC: 36 g/dL (ref 30.0–36.0)
MCV: 97.7 fl (ref 78.0–100.0)
Monocytes Absolute: 0.5 10*3/uL (ref 0.1–1.0)
Neutro Abs: 3 10*3/uL (ref 1.4–7.7)
RBC: 4.67 Mil/uL (ref 4.22–5.81)

## 2010-10-14 LAB — URINALYSIS
Ketones, ur: NEGATIVE
Leukocytes, UA: NEGATIVE
Nitrite: NEGATIVE
Specific Gravity, Urine: 1.015 (ref 1.000–1.030)
Urobilinogen, UA: 0.2 (ref 0.0–1.0)
pH: 6 (ref 5.0–8.0)

## 2010-10-14 LAB — LIPID PANEL
Total CHOL/HDL Ratio: 5
Triglycerides: 179 mg/dL — ABNORMAL HIGH (ref 0.0–149.0)

## 2010-10-14 LAB — PSA: PSA: 0.98 ng/mL (ref 0.10–4.00)

## 2010-10-14 LAB — HEPATIC FUNCTION PANEL
Albumin: 4.1 g/dL (ref 3.5–5.2)
Alkaline Phosphatase: 66 U/L (ref 39–117)
Total Protein: 6.3 g/dL (ref 6.0–8.3)

## 2010-10-18 ENCOUNTER — Telehealth: Payer: Self-pay

## 2010-10-18 NOTE — Telephone Encounter (Signed)
Left mess on phone

## 2010-10-18 NOTE — Telephone Encounter (Signed)
Message copied by Madison Hickman on Mon Oct 18, 2010  9:58 AM ------      Message from: Dwaine Deter      Created: Mon Oct 18, 2010  9:24 AM       Normal except mildly elevated glucose

## 2010-10-21 ENCOUNTER — Ambulatory Visit (INDEPENDENT_AMBULATORY_CARE_PROVIDER_SITE_OTHER): Payer: Medicare HMO | Admitting: Family Medicine

## 2010-10-21 ENCOUNTER — Encounter: Payer: Self-pay | Admitting: Family Medicine

## 2010-10-21 VITALS — BP 116/78 | HR 56 | Ht 73.0 in | Wt 212.0 lb

## 2010-10-21 DIAGNOSIS — Z0000 Encounter for general adult medical examination without abnormal findings: Secondary | ICD-10-CM

## 2010-10-21 MED ORDER — GLIPIZIDE ER 5 MG PO TB24: 5.0000 mg | ORAL_TABLET | Freq: Two times a day (BID) | ORAL | Status: DC

## 2010-10-21 MED ORDER — METHOTREXATE (ANTI-RHEUMATIC) 2.5 MG PO TABS: 2.5000 mg | ORAL_TABLET | ORAL | Status: DC

## 2010-10-21 MED ORDER — ALLOPURINOL 300 MG PO TABS: 300.0000 mg | ORAL_TABLET | Freq: Every day | ORAL | Status: DC

## 2010-10-21 MED ORDER — FOLIC ACID 1 MG PO TABS: 1.0000 mg | ORAL_TABLET | Freq: Every day | ORAL | Status: DC

## 2010-10-21 MED ORDER — ZOLPIDEM TARTRATE 10 MG PO TABS: 5.0000 mg | ORAL_TABLET | Freq: Every evening | ORAL | Status: DC | PRN

## 2010-10-21 MED ORDER — ESCITALOPRAM OXALATE 10 MG PO TABS: 10.0000 mg | ORAL_TABLET | Freq: Every day | ORAL | Status: DC

## 2010-10-21 MED ORDER — ROSUVASTATIN CALCIUM 20 MG PO TABS: 20.0000 mg | ORAL_TABLET | Freq: Every day | ORAL | Status: DC

## 2010-10-21 MED ORDER — LISINOPRIL 20 MG PO TABS: 20.0000 mg | ORAL_TABLET | Freq: Every day | ORAL | Status: DC

## 2010-10-21 NOTE — Progress Notes (Signed)
  Subjective:    Patient ID: [name] [name], male    DOB: Feb 03, 1946, 65 y.o.   MRN: [id number]  HPI    Review of Systems     Objective:   Physical Exam        Assessment & Plan:

## 2010-10-21 NOTE — Progress Notes (Signed)
  Subjective:    Patient ID: [name] [name], male    DOB: 16-May-1946, 65 y.o.   MRN: [id number]  HPI 65 yr old male for a cpx. He feels well and has no complaints.    Review of Systems  Constitutional: Negative.  Negative for fever, diaphoresis, activity change, appetite change, fatigue and unexpected weight change.  HENT: Negative.  Negative for hearing loss, ear pain, nosebleeds, congestion, sore throat, trouble swallowing, neck pain, neck stiffness, voice change and tinnitus.   Eyes: Negative.  Negative for photophobia, pain, discharge, redness and visual disturbance.  Respiratory: Negative.  Negative for apnea, cough, choking, chest tightness, shortness of breath, wheezing and stridor.   Cardiovascular: Negative.  Negative for chest pain, palpitations and leg swelling.  Gastrointestinal: Negative.  Negative for nausea, vomiting, abdominal pain, diarrhea, constipation, blood in stool, abdominal distention and rectal pain.  Genitourinary: Negative.  Negative for dysuria, urgency, frequency, flank pain, scrotal swelling, enuresis, difficulty urinating and testicular pain.  Musculoskeletal: Negative.  Negative for myalgias, back pain, joint swelling, arthralgias and gait problem.  Skin: Negative.  Negative for color change, pallor, rash and wound.  Neurological: Negative.  Negative for dizziness, tremors, seizures, syncope, speech difficulty, weakness, light-headedness, numbness and headaches.  Hematological: Negative.  Negative for adenopathy. Does not bruise/bleed easily.  Psychiatric/Behavioral: Negative.  Negative for hallucinations, behavioral problems, confusion, sleep disturbance, dysphoric mood and agitation. The patient is not nervous/anxious.        Objective:   Physical Exam  Constitutional: He appears well-developed and well-nourished. No distress.  HENT:  Head: Normocephalic and atraumatic.  Right Ear: External ear normal.  Left Ear: External ear normal.  Nose: Nose  normal.  Mouth/Throat: Oropharynx is clear and moist. No oropharyngeal exudate.  Eyes: Conjunctivae and EOM are normal. Pupils are equal, round, and reactive to light. Right eye exhibits no discharge. Left eye exhibits no discharge. No scleral icterus.  Neck: Normal range of motion. Neck supple. No JVD present. No thyromegaly present.  Cardiovascular: Normal rate, regular rhythm, normal heart sounds and intact distal pulses.  Exam reveals no gallop and no friction rub.   No murmur heard. Pulmonary/Chest: Effort normal and breath sounds normal. No stridor. No respiratory distress. He has no wheezes. He has no rales. He exhibits no tenderness.  Abdominal: Soft. Normal appearance and bowel sounds are normal. He exhibits no distension, no abdominal bruit, no ascites and no mass. There is no hepatosplenomegaly. There is no tenderness. There is no rigidity, no rebound and no guarding. No hernia.  Genitourinary: Rectum normal, prostate normal and penis normal. Guaiac negative stool. No penile tenderness.  Musculoskeletal: Normal range of motion. He exhibits no edema and no tenderness.  Lymphadenopathy:    He has no cervical adenopathy.  Neurological: He is alert. He has normal reflexes. No cranial nerve deficit. He exhibits normal muscle tone. Coordination normal.  Skin: Skin is warm and dry. No rash noted. He is not diaphoretic. No erythema. No pallor.  Psychiatric: He has a normal mood and affect. His behavior is normal. Judgment and thought content normal.          Assessment & Plan:  He is doing well. Reinforced the importance of diet and exercise.

## 2011-01-06 ENCOUNTER — Ambulatory Visit (INDEPENDENT_AMBULATORY_CARE_PROVIDER_SITE_OTHER): Payer: Medicare HMO | Admitting: Family Medicine

## 2011-01-06 DIAGNOSIS — Z23 Encounter for immunization: Secondary | ICD-10-CM

## 2011-01-28 NOTE — Assessment & Plan Note (Signed)
[facility] OFFICE NOTE   NAME:[name] [name], [name] [name]                   MRN:          [id number]  DATE:09/27/2006                            DOB:          02-24-1946    This is a 65 year old gentleman here for a complete physical  examination.  Generally, he is feeling good and has no complaints.  He  has not had a gout flare-up for several years.  He remains active and  seems to watch his diet fairly closely from our discussion today.  We  have been following him for hypertension, gout and hyperlipidemia  primarily.  He had been on Vytorin up until about a year ago with good  control of his cholesterol panel.  Unfortunately, this seemed to cause a  bump in his liver enzymes, so we discontinued the Vytorin on September 13, 2005.  His AST was 61 and his ALT was 104.  We then followed up with  liver enzymes on December 19, 2005.  AST was down to 38 and ALT was down to  76.  We then advised him to follow a strict diet only with no  medications, which he has done since then.  There have been no other  recent changes in his health.  He did have an unremarkable colonoscopy  in 2006.  Other details of his past medical history, family history,  social history, habits, etc., refer to our last physical note dated  September 20, 2005.   ALLERGIES:  NONE.   CURRENT MEDICATIONS:  1. Aspirin 81 mg per day.  2. Dilacor 180 mg per day.  3. Lisinopril 20 mg per day.  4. Zyloprim 300 mg per day.  5. Ambien 10 mg 1/2 of a tablet nightly.  6. Cialis 20 mg as needed.   OBJECTIVE:  Height 6 feet 2 inches.  Weight 219.  BP  120/68.  Pulse 80  and regular.  GENERAL:  He appears to be doing well.  SKIN:  Free of significant lesions, although he has a large number of  nevi, seborrheic keratoses and hemangiomata scattered diffusely.  Eyes clear.  Pharynx clear.  NECK:  Supple without lymphadenopathy or mass.  LUNGS:  Clear.  CARDIAC:  Rate  and rhythm regular without gallops, murmurs or rubs.  Distal pulses are full.  EKG is within normal limits.  ABDOMEN:  Soft.  Normal bowel sounds.  Non-tender.  No masses.  GENITALIA:  Normal male.  He is circumcised.  RECTAL EXAM:  No masses or tenderness.  Prostate is within normal  limits.  Stool hemoccult negative.  EXTREMITIES:  No clubbing, cyanosis or edema.  NEUROLOGIC EXAM:  Grossly intact.   He was here for fasting labs on September 20, 2006.  These were remarkable  for an abnormal lipid panel, an abnormal fasting glucose and slightly  elevated liver enzymes.  Again, today his AST is slightly high at 39,  and his ALT is slightly high at 65 with a normal total bilirubin.  Fasting glucose is elevated to 140 (this has bounced around between 120  and 140 for the  past several years.)  As far as his lipid panel, total  cholesterol is high at 258, triglycerides high at 284, VLDL high at 57,  LDL high at 148 and HDL low at 38.  The remainder of his laboratories  were within normal limits.   ASSESSMENT AND PLAN:  1. Complete physical.  Encouraged him to continue with his regular      exercise.  2. Hypertension.  Stable.  3. Erectile dysfunction.  Stable.  4. Insomnia.  Stable.  5. Gout.  Stable.  6. Hyperlipidemia.  We will set him up for he and his wife to meet      with a nutritionist for more detailed counseling at some point in      the near future.  We will carefully begin Zocor 40 mg once a day.      Given his history of mildly elevated liver enzymes, we will follow      these very closely as well.  I asked him to come back for another      fasting lipid panel and liver panel in 3 months.  7. Hyperglycemia versus borderline diabetes mellitus.  I will have the      nutritionist address this as well, and we will also follow this      closely.     Tera Mater. Clent Ridges, MD  Electronically Signed    SAF/MedQ  DD: 09/27/2006  DT: 09/27/2006  Job #: [id number]

## 2011-02-08 ENCOUNTER — Ambulatory Visit (INDEPENDENT_AMBULATORY_CARE_PROVIDER_SITE_OTHER): Payer: Medicare HMO | Admitting: Family Medicine

## 2011-02-08 ENCOUNTER — Encounter: Payer: Self-pay | Admitting: Family Medicine

## 2011-02-08 VITALS — BP 138/86 | HR 82 | Temp 98.4°F | Resp 16 | Wt 212.5 lb

## 2011-02-08 DIAGNOSIS — J329 Chronic sinusitis, unspecified: Secondary | ICD-10-CM

## 2011-02-08 MED ORDER — HYDROCODONE-HOMATROPINE 5-1.5 MG/5ML PO SYRP: 5.0000 mL | ORAL_SOLUTION | ORAL | Status: AC | PRN

## 2011-02-08 MED ORDER — AZITHROMYCIN 250 MG PO TABS: ORAL_TABLET | ORAL | Status: AC

## 2011-02-08 NOTE — Progress Notes (Signed)
  Subjective:    Patient ID: [name] [name], male    DOB: July 31, 1946, 65 y.o.   MRN: [id number]  HPI Here for one week of stuffy head, PND, hoarse voice, and a dry cough. No fever.    Review of Systems  Constitutional: Negative.   HENT: Positive for congestion and sinus pressure. Negative for postnasal drip.   Eyes: Negative.   Respiratory: Positive for cough. Negative for shortness of breath and wheezing.   Cardiovascular: Negative.        Objective:   Physical Exam  Constitutional: He appears well-developed and well-nourished. No distress.  HENT:  Right Ear: External ear normal.  Left Ear: External ear normal.  Nose: Nose normal.  Mouth/Throat: Oropharynx is clear and moist. No oropharyngeal exudate.  Eyes: Conjunctivae are normal. Pupils are equal, round, and reactive to light.  Neck: No thyromegaly present.  Pulmonary/Chest: Effort normal and breath sounds normal. No respiratory distress. He has no wheezes. He has no rales. He exhibits no tenderness.  Lymphadenopathy:    He has no cervical adenopathy.          Assessment & Plan:  Rest ,fluids

## 2011-02-16 ENCOUNTER — Encounter: Payer: Self-pay | Admitting: Family Medicine

## 2011-02-16 ENCOUNTER — Ambulatory Visit (INDEPENDENT_AMBULATORY_CARE_PROVIDER_SITE_OTHER): Payer: Medicare HMO | Admitting: Family Medicine

## 2011-02-16 VITALS — BP 130/80 | HR 74 | Wt 213.0 lb

## 2011-02-16 DIAGNOSIS — H6120 Impacted cerumen, unspecified ear: Secondary | ICD-10-CM

## 2011-02-16 NOTE — Progress Notes (Signed)
  Subjective:    Patient ID: [name] [name], male    DOB: 25-Aug-1946, 65 y.o.   MRN: [id number]  HPI Here for the sudden stopping up of his right ear while in the shower this morning. No pain. He was here a week or so ago for a sinusitis, and this has resolved.    Review of Systems  Constitutional: Negative.   HENT: Positive for hearing loss. Negative for ear pain, congestion, sinus pressure and tinnitus.        Objective:   Physical Exam  Constitutional: He appears well-developed and well-nourished.  HENT:  Left Ear: External ear normal.  Nose: Nose normal.  Mouth/Throat: Oropharynx is clear and moist.       The right ear canal is full of cerumen  Eyes: Conjunctivae are normal. Pupils are equal, round, and reactive to light.  Neck: No thyromegaly present.  Lymphadenopathy:    He has no cervical adenopathy.          Assessment & Plan:  The ear was irrigated clear with water

## 2011-02-22 ENCOUNTER — Telehealth: Payer: Self-pay | Admitting: *Deleted

## 2011-02-22 DIAGNOSIS — L989 Disorder of the skin and subcutaneous tissue, unspecified: Secondary | ICD-10-CM

## 2011-02-22 NOTE — Telephone Encounter (Signed)
Pt is calling about the referral to a derm on [address]. He states that it was someone that started with a K. Dx: Pre-skin cancer

## 2011-02-28 NOTE — Telephone Encounter (Signed)
This was supposed to have been done already, but if not I ordered a referral again

## 2011-03-01 NOTE — Telephone Encounter (Signed)
Already done

## 2011-05-09 ENCOUNTER — Telehealth: Payer: Self-pay | Admitting: Family Medicine

## 2011-05-09 DIAGNOSIS — R7309 Other abnormal glucose: Secondary | ICD-10-CM

## 2011-05-09 NOTE — Telephone Encounter (Signed)
Patient was told that he would need  quarterly fasting labs per Dr Clent Ridges. Please order. Thanks.

## 2011-05-09 NOTE — Telephone Encounter (Signed)
Set him up for a fasting lipid panel, hepatic panel, and A1c for 790.29

## 2011-05-10 NOTE — Telephone Encounter (Signed)
I put lab order in computer and spoke with pt. 

## 2011-05-18 ENCOUNTER — Other Ambulatory Visit (INDEPENDENT_AMBULATORY_CARE_PROVIDER_SITE_OTHER): Payer: Medicare HMO

## 2011-05-18 DIAGNOSIS — R7309 Other abnormal glucose: Secondary | ICD-10-CM

## 2011-05-18 DIAGNOSIS — E119 Type 2 diabetes mellitus without complications: Secondary | ICD-10-CM

## 2011-05-18 LAB — HEPATIC FUNCTION PANEL
Albumin: 4.4 g/dL (ref 3.5–5.2)
Total Protein: 6.9 g/dL (ref 6.0–8.3)

## 2011-05-18 LAB — LIPID PANEL
Cholesterol: 150 mg/dL (ref 0–200)
HDL: 40.3 mg/dL (ref >39.00)
LDL Cholesterol: 85 mg/dL (ref 0–99)
Total CHOL/HDL Ratio: 4
Triglycerides: 126 mg/dL (ref 0.0–149.0)
VLDL: 25.2 mg/dL (ref 0.0–40.0)

## 2011-05-18 LAB — HEMOGLOBIN A1C: Hgb A1c MFr Bld: 8.2 % — ABNORMAL HIGH (ref 4.6–6.5)

## 2011-05-20 ENCOUNTER — Encounter: Payer: Self-pay | Admitting: Family Medicine

## 2011-05-20 ENCOUNTER — Telehealth: Payer: Self-pay | Admitting: Family Medicine

## 2011-05-20 MED ORDER — METFORMIN HCL 1000 MG PO TABS: 1000.0000 mg | ORAL_TABLET | Freq: Two times a day (BID) | ORAL | Status: DC

## 2011-05-20 NOTE — Telephone Encounter (Signed)
Script faxed to [facility] and labs faxed to pt. Also pt is aware of lab results.

## 2011-05-20 NOTE — Telephone Encounter (Signed)
Left voice message for pt to return my call.

## 2011-05-20 NOTE — Telephone Encounter (Signed)
Message copied by Baldemar Friday on Fri May 20, 2011 12:33 PM ------      Message from: Gershon Crane A      Created: Thu May 19, 2011  2:22 PM       His chol is excellent, but his diabetes has gotten a little worse. Stay on Glipizide, but add Metformin 1000 mg bid. Call in one year supply. Recheck an A1c in 90 days

## 2011-05-20 NOTE — Telephone Encounter (Signed)
Message copied by Baldemar Friday on Fri May 20, 2011  1:58 PM ------      Message from: Gershon Crane A      Created: Thu May 19, 2011  2:22 PM       His chol is excellent, but his diabetes has gotten a little worse. Stay on Glipizide, but add Metformin 1000 mg bid. Call in one year supply. Recheck an A1c in 90 days

## 2011-05-31 ENCOUNTER — Ambulatory Visit (INDEPENDENT_AMBULATORY_CARE_PROVIDER_SITE_OTHER): Payer: Medicare HMO | Admitting: Family Medicine

## 2011-05-31 ENCOUNTER — Encounter: Payer: Self-pay | Admitting: Family Medicine

## 2011-05-31 VITALS — BP 108/60 | HR 95 | Temp 98.5°F | Wt 208.0 lb

## 2011-05-31 DIAGNOSIS — I10 Essential (primary) hypertension: Secondary | ICD-10-CM

## 2011-07-20 ENCOUNTER — Telehealth: Payer: Self-pay | Admitting: Family Medicine

## 2011-07-20 DIAGNOSIS — Z0000 Encounter for general adult medical examination without abnormal findings: Secondary | ICD-10-CM

## 2011-07-20 MED ORDER — LISINOPRIL 20 MG PO TABS: 20.0000 mg | ORAL_TABLET | Freq: Every day | ORAL | Status: DC

## 2011-07-20 NOTE — Telephone Encounter (Signed)
Pt is requesting a 90 day supply of Lisinopril.

## 2011-07-20 NOTE — Telephone Encounter (Signed)
Script sent e-scribe and pt aware. 

## 2011-08-18 NOTE — Progress Notes (Signed)
  Subjective:    Patient ID: [name] [name], male    DOB: [birth year], 65 y.o.   MRN: [id number]  HPI    Review of Systems     Objective:   Physical Exam        Assessment & Plan:

## 2011-08-20 ENCOUNTER — Other Ambulatory Visit: Payer: Self-pay | Admitting: Family Medicine

## 2011-08-29 ENCOUNTER — Other Ambulatory Visit (INDEPENDENT_AMBULATORY_CARE_PROVIDER_SITE_OTHER): Payer: Medicare HMO

## 2011-08-29 DIAGNOSIS — IMO0001 Reserved for inherently not codable concepts without codable children: Secondary | ICD-10-CM

## 2011-08-29 LAB — HEMOGLOBIN A1C: Hgb A1c MFr Bld: 6.1 % (ref 4.6–6.5)

## 2011-08-30 ENCOUNTER — Telehealth: Payer: Self-pay | Admitting: Family Medicine

## 2011-08-30 NOTE — Telephone Encounter (Signed)
Pt is req to get a script for test strips and lancets for [employer]. Pls call in to [facility] on [address].

## 2011-09-01 MED ORDER — GLUCOSE BLOOD VI STRP: ORAL_STRIP | Status: DC

## 2011-09-01 MED ORDER — AGAMATRIX ULTRA-THIN LANCETS MISC: 1.0000 | Freq: Every day | Status: DC

## 2011-09-01 NOTE — Progress Notes (Signed)
Quick Note:  Left voice message ______ 

## 2011-09-01 NOTE — Telephone Encounter (Signed)
Scripts sent e-scribe to [facility].

## 2011-09-06 ENCOUNTER — Other Ambulatory Visit: Payer: Self-pay | Admitting: Family Medicine

## 2011-09-09 ENCOUNTER — Other Ambulatory Visit: Payer: Self-pay

## 2011-09-09 ENCOUNTER — Ambulatory Visit (INDEPENDENT_AMBULATORY_CARE_PROVIDER_SITE_OTHER): Payer: Medicare HMO | Admitting: Family

## 2011-09-09 ENCOUNTER — Encounter: Payer: Self-pay | Admitting: Family

## 2011-09-09 VITALS — BP 118/70 | Temp 98.2°F | Wt 207.0 lb

## 2011-09-09 DIAGNOSIS — J329 Chronic sinusitis, unspecified: Secondary | ICD-10-CM

## 2011-09-09 DIAGNOSIS — Z0000 Encounter for general adult medical examination without abnormal findings: Secondary | ICD-10-CM

## 2011-09-09 DIAGNOSIS — H6122 Impacted cerumen, left ear: Secondary | ICD-10-CM

## 2011-09-09 DIAGNOSIS — H6120 Impacted cerumen, unspecified ear: Secondary | ICD-10-CM

## 2011-09-09 MED ORDER — ZOLPIDEM TARTRATE 10 MG PO TABS: 5.0000 mg | ORAL_TABLET | Freq: Every evening | ORAL | Status: DC | PRN

## 2011-09-09 MED ORDER — AMOXICILLIN 500 MG PO TABS: 1000.0000 mg | ORAL_TABLET | Freq: Two times a day (BID) | ORAL | Status: DC

## 2011-09-09 NOTE — Progress Notes (Signed)
Subjective:    Patient ID: [name] [name], male    DOB: 11/30/1945, 65 y.o.   MRN: [id number]  HPI 65 year old white male, former smoker, patient of Dr. Claris Che M. with complaints of sore throat, cough, right side facial pain and pressure, nasal congestion productive cough with yellow mucus cough for 3 weeks, and worsening. His intake and Mucinex DM with no relief. His wife is also here being treated today.   Review of Systems  Constitutional: Negative.   HENT: Positive for congestion, rhinorrhea, postnasal drip and sinus pressure.   Eyes: Negative.   Respiratory: Positive for cough.   Cardiovascular: Negative.   Musculoskeletal: Negative.   Skin: Negative.   Neurological: Negative.   Hematological: Negative.        Past Medical History  Diagnosis Date  . Gout   . Diverticulosis of colon   . Hyperlipidemia   . Hypertension   . Diabetes mellitus     type II  . ED (erectile dysfunction)   . Normal cardiac stress test 09-27-02  . Uveitis     sees Dr Peggyann Shoals at [facility]    History   Social History  . Marital Status: Married    Spouse Name: N/A    Number of Children: N/A  . Years of Education: N/A   Occupational History  . Not on file.   Social History Main Topics  . Smoking status: Former Games developer  . Smokeless tobacco: Never Used  . Alcohol Use: No  . Drug Use: No  . Sexually Active: Not on file   Other Topics Concern  . Not on file   Social History Narrative  . No narrative on file    Past Surgical History  Procedure Date  . Appendectomy   . Tonsillectomy   . Shoulder arthroscopy     both shoulders   . Cataract extraction     Dr Dione Booze     No family history on file.  Allergies  Allergen Reactions  . Red Dye     Current Outpatient Prescriptions on File Prior to Visit  Medication Sig Dispense Refill  . AGAMATRIX ULTRA-THIN LANCETS MISC 1 each by Does not apply route daily.  100 each  3  . allopurinol (ZYLOPRIM) 300 MG tablet TAKE 1 TABLET  DAILY  90 tablet  2  . aspirin 81 MG tablet Take 81 mg by mouth daily.        . calcium-vitamin D (OSCAL WITH D) 500-200 MG-UNIT per tablet Take 2 tablets by mouth daily.        . DORZOLAMIDE HCL OP Apply 1 drop to eye daily. Each eye       . escitalopram (LEXAPRO) 10 MG tablet Take 1 tablet (10 mg total) by mouth daily.  90 tablet  3  . folic acid (FOLVITE) 1 MG tablet TAKE 1 TABLET DAILY  90 tablet  2  . GLIPIZIDE XL 5 MG 24 hr tablet TAKE 1 TABLET BY MOUTH TWICE DAILY  180 tablet  2  . glucose blood (AGAMATRIX PRESTO TEST) test strip Test once a day  100 each  3  . lisinopril (PRINIVIL,ZESTRIL) 20 MG tablet TAKE 1 TABLET DAILY  90 tablet  2  . metFORMIN (GLUCOPHAGE) 1000 MG tablet Take 1 tablet (1,000 mg total) by mouth 2 (two) times daily with a meal.  180 tablet  9  . methotrexate (RHEUMATREX) 2.5 MG tablet Take 1 tablet (2.5 mg total) by mouth as directed. Five tablets on Sunday  60 tablet  3  . methotrexate (RHEUMATREX) 2.5 MG tablet TAKE 5 TABLETS TOGETHER EVERY SUNDAY  60 tablet  2  . prednisoLONE sodium phosphate (INFLAMASE FORTE) 1 % ophthalmic solution Place 1 drop into both eyes daily.        . rosuvastatin (CRESTOR) 20 MG tablet Take 1 tablet (20 mg total) by mouth daily.  90 tablet  3  . Vitamin Mixture (VITAMIN E COMPLETE PO) Take 1 each by mouth daily.        Marland Kitchen zolpidem (AMBIEN) 10 MG tablet Take 0.5 tablets (5 mg total) by mouth at bedtime as needed.  90 tablet  3    BP 118/70  Temp(Src) 98.2 F (36.8 C) (Oral)  Wt 207 lb (93.895 kg)chart Objective:   Physical Exam  Constitutional: He is oriented to person, place, and time. He appears well-developed and well-nourished.  HENT:  Right Ear: External ear normal.  Nose: Nose normal.  Mouth/Throat: Oropharynx is clear and moist.       Left ear and packed with cerumen.  Neck: Normal range of motion. Neck supple.  Cardiovascular: Normal rate, regular rhythm and normal heart sounds.   Pulmonary/Chest: Effort normal and breath  sounds normal.  Abdominal: Soft. Bowel sounds are normal.  Neurological: He is alert and oriented to person, place, and time.  Skin: Skin is warm and dry.  Psychiatric: He has a normal mood and affect.      Informed consent was obtained and peroxide gel was inserted into the ears bilaterally using the lavage kit the ears were lavaged until clean.Inspection with a cerumen spoon removed residual wax. Patient tolerated the procedure well.    Assessment & Plan:  Assessment: Acute sinusitis.  Plan: Augmentin 875 one by mouth twice a day x10 days. Continue Mucinex., Symptoms worsen or persist here recheck as scheduled and when necessary.

## 2011-09-09 NOTE — Patient Instructions (Signed)
AndCerumen Impaction A cerumen impaction is when the wax in your ear forms a plug. This plug usually causes reduced hearing. Sometimes it also causes an earache or dizziness. Removing a cerumen impaction can be difficult and painful. The wax sticks to the ear canal. The canal is sensitive and bleeds easily. If you try to remove a heavy wax buildup with a cotton tipped swab, you may push it in further. Irrigation with water, suction, and small ear curettes may be used to clear out the wax. If the impaction is fixed to the skin in the ear canal, ear drops may be needed for a few days to loosen the wax. People who build up a lot of wax frequently can use ear wax removal products available in your local drugstore. SEEK MEDICAL CARE IF:  You develop an earache, increased hearing loss, or marked dizziness. Document Released: 10/06/2004 Document Revised: 05/11/2011 Document Reviewed: 11/26/2009 [facility] Patient Information 2012 [place], [place].

## 2011-09-12 ENCOUNTER — Telehealth: Payer: Self-pay

## 2011-09-12 NOTE — Telephone Encounter (Signed)
Pt was seen by [facility] on Friday and Rx'd amoxicillin. Pt has also been taking mucinex but is not feeling any better. He states that he feels like his head is stuffier than before.   Per Dr. Clent Ridges, have pt try mucinex D along with the abx and if he is not feeling better in a couple of days then her should schedule an OV.  Pt aware and verbalized understanding. Pt will call back if he needs to make the OV

## 2011-09-13 DIAGNOSIS — Z9289 Personal history of other medical treatment: Secondary | ICD-10-CM

## 2011-09-13 HISTORY — DX: Personal history of other medical treatment: Z92.89

## 2011-09-15 ENCOUNTER — Ambulatory Visit (INDEPENDENT_AMBULATORY_CARE_PROVIDER_SITE_OTHER): Payer: Medicare HMO | Admitting: Family Medicine

## 2011-09-15 ENCOUNTER — Encounter: Payer: Self-pay | Admitting: Family Medicine

## 2011-09-15 VITALS — BP 126/70 | HR 76 | Temp 98.9°F | Wt 202.0 lb

## 2011-09-15 DIAGNOSIS — J329 Chronic sinusitis, unspecified: Secondary | ICD-10-CM

## 2011-09-15 MED ORDER — LEVOFLOXACIN 500 MG PO TABS: 500.0000 mg | ORAL_TABLET | Freq: Every day | ORAL | Status: AC

## 2011-09-15 MED ORDER — METHYLPREDNISOLONE ACETATE 80 MG/ML IJ SUSP
120.0000 mg | Freq: Once | INTRAMUSCULAR | Status: AC
  Administered 2011-09-15: 120 mg via INTRAMUSCULAR

## 2011-09-15 NOTE — Progress Notes (Signed)
  Subjective:    Patient ID: [name] [name], male    DOB: Aug 16, 1946, 66 y.o.   MRN: [id number]  HPI Here for 2 weeks of sinus pressure, HA, PND, and a dry cough. He has had low grade fevers. He was seen here last week and was put on Amoxicillin. He has taken this for 7 days and is not much better. Using Mucinex D   Review of Systems  Constitutional: Positive for fever.  HENT: Positive for congestion, postnasal drip and sinus pressure.   Eyes: Negative.   Respiratory: Positive for cough.        Objective:   Physical Exam  Constitutional: He appears well-developed and well-nourished.  HENT:  Right Ear: External ear normal.  Left Ear: External ear normal.  Nose: Nose normal.  Mouth/Throat: Oropharynx is clear and moist. No oropharyngeal exudate.  Eyes: Conjunctivae are normal.  Pulmonary/Chest: Effort normal and breath sounds normal.  Lymphadenopathy:    He has no cervical adenopathy.          Assessment & Plan:  Switch to Levaquin. Recheck prn

## 2011-09-28 ENCOUNTER — Ambulatory Visit (INDEPENDENT_AMBULATORY_CARE_PROVIDER_SITE_OTHER): Payer: Medicare HMO | Admitting: Family Medicine

## 2011-09-28 DIAGNOSIS — Z23 Encounter for immunization: Secondary | ICD-10-CM

## 2011-10-09 ENCOUNTER — Other Ambulatory Visit: Payer: Self-pay | Admitting: Family Medicine

## 2011-10-25 ENCOUNTER — Other Ambulatory Visit (INDEPENDENT_AMBULATORY_CARE_PROVIDER_SITE_OTHER): Payer: Medicare HMO

## 2011-10-25 DIAGNOSIS — Z79899 Other long term (current) drug therapy: Secondary | ICD-10-CM

## 2011-10-25 DIAGNOSIS — Z125 Encounter for screening for malignant neoplasm of prostate: Secondary | ICD-10-CM

## 2011-10-25 DIAGNOSIS — Z0000 Encounter for general adult medical examination without abnormal findings: Secondary | ICD-10-CM

## 2011-10-25 LAB — CBC WITH DIFFERENTIAL/PLATELET
Eosinophils Relative: 4.5 % (ref 0.0–5.0)
HCT: 41.5 % (ref 39.0–52.0)
Hemoglobin: 14.1 g/dL (ref 13.0–17.0)
Lymphs Abs: 1 10*3/uL (ref 0.7–4.0)
MCV: 99.2 fl (ref 78.0–100.0)
Monocytes Absolute: 0.4 10*3/uL (ref 0.1–1.0)
Monocytes Relative: 9.4 % (ref 3.0–12.0)
Neutro Abs: 2.7 10*3/uL (ref 1.4–7.7)
RDW: 14.3 % (ref 11.5–14.6)
WBC: 4.3 10*3/uL — ABNORMAL LOW (ref 4.5–10.5)

## 2011-10-25 LAB — BASIC METABOLIC PANEL
Chloride: 102 mEq/L (ref 96–112)
GFR: 93.21 mL/min (ref >60.00)
Glucose, Bld: 99 mg/dL (ref 70–99)
Potassium: 5.1 mEq/L (ref 3.5–5.1)
Sodium: 137 mEq/L (ref 135–145)

## 2011-10-25 LAB — HEPATIC FUNCTION PANEL
ALT: 37 U/L (ref 0–53)
AST: 28 U/L (ref 0–37)
Albumin: 4.1 g/dL (ref 3.5–5.2)

## 2011-10-25 LAB — POCT URINALYSIS DIPSTICK
Bilirubin, UA: NEGATIVE
Glucose, UA: NEGATIVE
Leukocytes, UA: NEGATIVE
Nitrite, UA: NEGATIVE
pH, UA: 6

## 2011-10-25 LAB — PSA: PSA: 1.72 ng/mL (ref 0.10–4.00)

## 2011-10-25 LAB — LIPID PANEL: Cholesterol: 137 mg/dL (ref 0–200)

## 2011-10-25 NOTE — Progress Notes (Signed)
Addended by: Bonnye Fava on: 10/25/2011 09:15 AM   Modules accepted: Orders

## 2011-10-26 NOTE — Progress Notes (Signed)
Quick Note:  Left voice message ______ 

## 2011-11-01 ENCOUNTER — Encounter: Payer: Self-pay | Admitting: Family Medicine

## 2011-11-01 ENCOUNTER — Ambulatory Visit (INDEPENDENT_AMBULATORY_CARE_PROVIDER_SITE_OTHER): Payer: Medicare HMO | Admitting: Family Medicine

## 2011-11-01 VITALS — BP 118/76 | HR 62 | Temp 98.2°F | Ht 72.25 in | Wt 194.0 lb

## 2011-11-01 DIAGNOSIS — Z0000 Encounter for general adult medical examination without abnormal findings: Secondary | ICD-10-CM

## 2011-11-01 NOTE — Progress Notes (Signed)
  Subjective:    Patient ID: [name] [name], male    DOB: 12-Jan-1946, 66 y.o.   MRN: [id number]  HPI 66 yr old male for a cpx. He feels well and has only one issue to talk about. For several years he has had a mild pain in the anterior right shoulder. This is never severe and he does not wish to have it treated per se. He plays golf with no problems. His recent labs were excellent with an A1c of 6.0. He asks if he can get off some of his meds. He is past due for a colonoscopy. He had one in 2007 with Dr. Sherin Quarry, but he wants to switch to the [facility].    Review of Systems  Constitutional: Negative.   HENT: Negative.   Eyes: Negative.   Respiratory: Negative.   Cardiovascular: Negative.   Gastrointestinal: Negative.   Genitourinary: Negative.   Musculoskeletal: Negative.   Skin: Negative.   Neurological: Negative.   Hematological: Negative.   Psychiatric/Behavioral: Negative.        Objective:   Physical Exam  Constitutional: He is oriented to person, place, and time. He appears well-developed and well-nourished. No distress.  HENT:  Head: Normocephalic and atraumatic.  Right Ear: External ear normal.  Left Ear: External ear normal.  Nose: Nose normal.  Mouth/Throat: Oropharynx is clear and moist. No oropharyngeal exudate.  Eyes: Conjunctivae and EOM are normal. Pupils are equal, round, and reactive to light. Right eye exhibits no discharge. Left eye exhibits no discharge. No scleral icterus.  Neck: Neck supple. No JVD present. No tracheal deviation present. No thyromegaly present.  Cardiovascular: Normal rate, regular rhythm, normal heart sounds and intact distal pulses.  Exam reveals no gallop and no friction rub.   No murmur heard. Pulmonary/Chest: Effort normal and breath sounds normal. No respiratory distress. He has no wheezes. He has no rales. He exhibits no tenderness.  Abdominal: Soft. Bowel sounds are normal. He exhibits no distension and no mass. There is  no tenderness. There is no rebound and no guarding.  Genitourinary: Rectum normal, prostate normal and penis normal. Guaiac negative stool. No penile tenderness.  Musculoskeletal: Normal range of motion. He exhibits no edema.       Tender in the right anterior shoulder in the subacrominal area and over the proximal insertions of the biceps tendons   Lymphadenopathy:    He has no cervical adenopathy.  Neurological: He is alert and oriented to person, place, and time. He has normal reflexes. No cranial nerve deficit. He exhibits normal muscle tone. Coordination normal.  Skin: Skin is warm and dry. No rash noted. He is not diaphoretic. No erythema. No pallor.  Psychiatric: He has a normal mood and affect. His behavior is normal. Judgment and thought content normal.          Assessment & Plan:  Well exam. His diabetes is well controlled since he started exercising and changing his diet. We will stop the Glipizide and stay on Metformin. Set up a colonoscopy. He has some bursitis and biceps tendonitis in the right shoulder. He will use Advil and ice prn.

## 2011-12-26 ENCOUNTER — Other Ambulatory Visit: Payer: Self-pay | Admitting: Family Medicine

## 2012-01-03 ENCOUNTER — Encounter: Payer: Self-pay | Admitting: Gastroenterology

## 2012-01-09 ENCOUNTER — Encounter: Payer: Self-pay | Admitting: Gastroenterology

## 2012-01-09 ENCOUNTER — Telehealth: Payer: Self-pay | Admitting: Family Medicine

## 2012-01-09 DIAGNOSIS — E119 Type 2 diabetes mellitus without complications: Secondary | ICD-10-CM

## 2012-01-09 NOTE — Telephone Encounter (Signed)
Pt  Is requesting A1C. Can I sch?

## 2012-01-10 NOTE — Telephone Encounter (Signed)
Addended by: Aniceto Boss A on: 01/10/2012 01:53 PM   Modules accepted: Orders

## 2012-01-10 NOTE — Telephone Encounter (Signed)
I put future lab order in computer. 

## 2012-01-10 NOTE — Telephone Encounter (Signed)
Pt is sch for 01-12-2012

## 2012-01-10 NOTE — Telephone Encounter (Signed)
Yes please for 250.00

## 2012-01-12 ENCOUNTER — Other Ambulatory Visit (INDEPENDENT_AMBULATORY_CARE_PROVIDER_SITE_OTHER): Payer: Medicare HMO

## 2012-01-12 DIAGNOSIS — E119 Type 2 diabetes mellitus without complications: Secondary | ICD-10-CM

## 2012-01-12 DIAGNOSIS — IMO0001 Reserved for inherently not codable concepts without codable children: Secondary | ICD-10-CM

## 2012-01-17 ENCOUNTER — Encounter: Payer: Self-pay | Admitting: Family Medicine

## 2012-01-17 ENCOUNTER — Ambulatory Visit (INDEPENDENT_AMBULATORY_CARE_PROVIDER_SITE_OTHER): Payer: Medicare HMO | Admitting: Family Medicine

## 2012-01-17 VITALS — BP 128/80 | HR 84 | Temp 98.5°F | Wt 198.0 lb

## 2012-01-17 DIAGNOSIS — R002 Palpitations: Secondary | ICD-10-CM

## 2012-01-17 DIAGNOSIS — I951 Orthostatic hypotension: Secondary | ICD-10-CM

## 2012-01-17 DIAGNOSIS — I10 Essential (primary) hypertension: Secondary | ICD-10-CM

## 2012-01-17 MED ORDER — LISINOPRIL 20 MG PO TABS: 10.0000 mg | ORAL_TABLET | Freq: Every day | ORAL | Status: DC

## 2012-01-17 NOTE — Progress Notes (Signed)
  Subjective:    Patient ID: [name] [name], male    DOB: August 19, 1946, 66 y.o.   MRN: [id number]  HPI Here to discuss brief episodes of lightheadedness when he stands up quickly which occur once every day or two. He gives an example of bending over to place a tee in the ground while playing golf, and when he stood up he felt dizzy for several seconds. This passes quickly and he feels back to normal. No SOB or chest pains. Separate from these episodes are occasional flutters in his chest which last few only a few seconds. These are not associated with exercise and have no other associated symptoms. He has lost about 25 lbs of weight in the past 4 months, and we have already cut back on his diabetes medications. He asks if his BP med is too strong. His EKG in February was normal.   Review of Systems  Constitutional: Negative.   Respiratory: Negative.   Cardiovascular: Negative for chest pain and leg swelling.  Neurological: Positive for light-headedness.       Objective:   Physical Exam  Constitutional: He appears well-developed and well-nourished.  Cardiovascular: Normal rate, regular rhythm, normal heart sounds and intact distal pulses.  Exam reveals no gallop and no friction rub.   No murmur heard. Pulmonary/Chest: Effort normal and breath sounds normal.  Musculoskeletal: He exhibits no edema.          Assessment & Plan:  He seems to be experiencing some orthostatic events, so we will decrease his Lisinopril to 1/2 a tab a day (total of 10 mg). We will also stop the Lexapro, since his moods are stable. Set up a 48 hour Holter monitor soon.

## 2012-01-18 NOTE — Progress Notes (Signed)
Quick Note:  Left voice message with normal results. ______ 

## 2012-01-31 ENCOUNTER — Encounter (INDEPENDENT_AMBULATORY_CARE_PROVIDER_SITE_OTHER): Payer: Medicare HMO

## 2012-01-31 DIAGNOSIS — R002 Palpitations: Secondary | ICD-10-CM

## 2012-02-03 NOTE — Progress Notes (Signed)
Addended by: Gershon Crane A on: 02/03/2012 10:12 AM   Modules accepted: Orders

## 2012-02-07 ENCOUNTER — Ambulatory Visit (INDEPENDENT_AMBULATORY_CARE_PROVIDER_SITE_OTHER): Payer: Medicare HMO | Admitting: Cardiovascular Disease

## 2012-02-07 ENCOUNTER — Encounter: Payer: Self-pay | Admitting: Cardiovascular Disease

## 2012-02-07 VITALS — BP 108/60 | HR 80 | Ht 75.0 in | Wt 197.0 lb

## 2012-02-07 DIAGNOSIS — R002 Palpitations: Secondary | ICD-10-CM | POA: Insufficient documentation

## 2012-02-07 NOTE — Assessment & Plan Note (Signed)
His monitor shows 2:1 AV block with PVCs. It is not clear that this is related to his symptoms of dizziness which occur after bending over. Will arrange echo to exclude structural heart disease. Will arrange 21 day event monitor to see if we can correlate his symptoms with any arrhythmias.

## 2012-02-07 NOTE — Patient Instructions (Signed)
Your physician recommends that you schedule a follow-up appointment in: about 4 weeks.   Your physician has requested that you have an echocardiogram. Echocardiography is a painless test that uses sound waves to create images of your heart. It provides your doctor with information about the size and shape of your heart and how well your heart's chambers and valves are working. This procedure takes approximately one hour. There are no restrictions for this procedure.   Your physician has recommended that you wear an event monitor. Event monitors are medical devices that record the heart's electrical activity. Doctors most often [place] these monitors to diagnose arrhythmias. Arrhythmias are problems with the speed or rhythm of the heartbeat. The monitor is a small, portable device. You can wear one while you do your normal daily activities. This is usually used to diagnose what is causing palpitations/syncope (passing out).

## 2012-02-07 NOTE — Progress Notes (Signed)
Quick Note:  I left voice message with results. ______ 

## 2012-02-07 NOTE — Progress Notes (Signed)
History of Present Illness: 66 yo male with history of HLD, HTN, DM, gout here today for new cardiac evaluation. He is followed by Dr. Clent Ridges in primary care. He has recent episodes of dizziness without syncope, especially when bending over such as to place a golf tee. He also has noted some palpitations. This is described as feeling his heart flutter or race. He has noticed this for years. 48 hour Holter monitor last week with NSR with PVCs with couplets and one episode of 2:1 AV block, Mobitz type 1. No chest pains, SOB. No syncope. His dizziness only happens after he bends over.   Primary Care Physician: Gershon Crane, MD  Past Medical History  Diagnosis Date  . Gout   . Diverticulosis of colon   . Hyperlipidemia   . Hypertension   . Diabetes mellitus     type II  . ED (erectile dysfunction)   . Normal cardiac stress test 09-27-02  . Uveitis     sees Dr Peggyann Shoals at [facility]    Past Surgical History  Procedure Date  . Appendectomy   . Tonsillectomy   . Shoulder arthroscopy     both shoulders   . Cataract extraction     Dr Dione Booze , left eye    Current Outpatient Prescriptions  Medication Sig Dispense Refill  . AGAMATRIX ULTRA-THIN LANCETS MISC 1 each by Does not apply route daily.  100 each  3  . allopurinol (ZYLOPRIM) 300 MG tablet TAKE 1 TABLET DAILY  90 tablet  2  . aspirin 81 MG tablet Take 81 mg by mouth daily.        . calcium-vitamin D (OSCAL WITH D) 500-200 MG-UNIT per tablet Take 2 tablets by mouth daily.        . CRESTOR 20 MG tablet TAKE 1 TABLET DAILY  90 tablet  3  . DORZOLAMIDE HCL OP Apply 1 drop to eye every other day. Each eye      . folic acid (FOLVITE) 1 MG tablet TAKE 1 TABLET DAILY  90 tablet  2  . glucose blood (AGAMATRIX PRESTO TEST) test strip Test once a day  100 each  3  . lisinopril (PRINIVIL,ZESTRIL) 20 MG tablet Take 0.5 tablets (10 mg total) by mouth daily.  90 tablet  2  . metFORMIN (GLUCOPHAGE) 1000 MG tablet Take 1 tablet (1,000 mg total) by  mouth 2 (two) times daily with a meal.  180 tablet  9  . methotrexate (RHEUMATREX) 2.5 MG tablet 2.5 mg once a week. Take 4 tablets once a week      . Omega-3 Fatty Acids (FISH OIL PO) Take by mouth daily.      . prednisoLONE sodium phosphate (INFLAMASE FORTE) 1 % ophthalmic solution Place 1 drop into both eyes every other day.       . zolpidem (AMBIEN) 10 MG tablet Take 0.5 tablets (5 mg total) by mouth at bedtime as needed.  30 tablet  2    Allergies  Allergen Reactions  . Red Dye     History   Social History  . Marital Status: Married    Spouse Name: N/A    Number of Children: 1  . Years of Education: N/A   Occupational History  . Marine scientist    Social History Main Topics  . Smoking status: Former Smoker -- 1.0 packs/day for 8 years    Types: Cigarettes    Quit date: 02/06/1970  . Smokeless tobacco: Never Used  . Alcohol  Use: 2.5 oz/week    5 drink(s) per week  . Drug Use: No  . Sexually Active: Not on file   Other Topics Concern  . Not on file   Social History Narrative  . No narrative on file    Family History  Problem Relation Age of Onset  . Heart attack Father 71  . Heart attack Paternal Grandmother     Review of Systems:  As stated in the HPI and otherwise negative.   BP 108/60  Pulse 80  Ht 6\' 3"  (1.905 m)  Wt 197 lb (89.359 kg)  BMI 24.62 kg/m2  Physical Examination: General: Well developed, well nourished, NAD HEENT: OP clear, mucus membranes moist SKIN: warm, dry. No rashes. Neuro: No focal deficits Musculoskeletal: Muscle strength 5/5 all ext Psychiatric: Mood and affect normal Neck: No JVD, no carotid bruits, no thyromegaly, no lymphadenopathy. Lungs:Clear bilaterally, no wheezes, rhonci, crackles Cardiovascular: Regular rate and rhythm. No murmurs, gallops or rubs. Abdomen:Soft. Bowel sounds present. Non-tender.  Extremities: No lower extremity edema. Pulses are 2 + in the bilateral DP/PT.

## 2012-02-09 ENCOUNTER — Ambulatory Visit (AMBULATORY_SURGERY_CENTER): Payer: Medicare HMO | Admitting: *Deleted

## 2012-02-09 ENCOUNTER — Telehealth: Payer: Self-pay | Admitting: *Deleted

## 2012-02-09 VITALS — Ht 74.5 in | Wt 196.3 lb

## 2012-02-09 DIAGNOSIS — Z1211 Encounter for screening for malignant neoplasm of colon: Secondary | ICD-10-CM

## 2012-02-09 MED ORDER — PEG-KCL-NACL-NASULF-NA ASC-C 100 G PO SOLR: ORAL | Status: DC

## 2012-02-09 NOTE — Progress Notes (Signed)
Pt's last colonoscopy was with Dr. Tasia Catchings 2006.  Pt scheduled for colonoscopy with Dr. Arlyce Dice 02/27/2012.  Release of information form signed and given to Merri Ray, CMA.

## 2012-02-09 NOTE — Telephone Encounter (Signed)
Pt's last colonoscopy was with Dr. James Weissman 2006.  Pt scheduled for colonoscopy with Dr. Kaplan 02/27/2012.  Release of information form signed and given to Robin Stallings, CMA.   

## 2012-02-09 NOTE — Telephone Encounter (Signed)
fAXED RECORDS RELEASE TODAY

## 2012-02-10 NOTE — Telephone Encounter (Signed)
Received records for this patient they are on Dr Nita Sells desk for review

## 2012-02-14 ENCOUNTER — Ambulatory Visit (HOSPITAL_COMMUNITY): Payer: Managed Care, Other (non HMO) | Attending: Cardiovascular Disease | Admitting: Radiology

## 2012-02-14 ENCOUNTER — Encounter (INDEPENDENT_AMBULATORY_CARE_PROVIDER_SITE_OTHER): Payer: Medicare HMO

## 2012-02-14 DIAGNOSIS — I10 Essential (primary) hypertension: Secondary | ICD-10-CM | POA: Insufficient documentation

## 2012-02-14 DIAGNOSIS — R002 Palpitations: Secondary | ICD-10-CM

## 2012-02-14 DIAGNOSIS — I359 Nonrheumatic aortic valve disorder, unspecified: Secondary | ICD-10-CM | POA: Insufficient documentation

## 2012-02-14 DIAGNOSIS — I517 Cardiomegaly: Secondary | ICD-10-CM

## 2012-02-14 DIAGNOSIS — R42 Dizziness and giddiness: Secondary | ICD-10-CM | POA: Insufficient documentation

## 2012-02-14 DIAGNOSIS — E785 Hyperlipidemia, unspecified: Secondary | ICD-10-CM | POA: Insufficient documentation

## 2012-02-14 DIAGNOSIS — E119 Type 2 diabetes mellitus without complications: Secondary | ICD-10-CM | POA: Insufficient documentation

## 2012-02-14 NOTE — Progress Notes (Signed)
.  mc3 

## 2012-02-20 ENCOUNTER — Telehealth: Payer: Self-pay | Admitting: Gastroenterology

## 2012-02-20 ENCOUNTER — Telehealth: Payer: Self-pay | Admitting: Cardiovascular Disease

## 2012-02-20 NOTE — Telephone Encounter (Signed)
Talked with patient's wife; pt is wearing 21 day heart monitor because of problems with palpitations. Mrs. [name] [name] wants to know if the monitor will interfere with the procedure.  Pt is scheduled for direct colonoscopy Monday 6/17.  I advised her to have pt call Dr. Clifton James to see if it is okay for pt to proceed with colonoscopy as scheduled due to problem with palpitations.  Pt will call back after talking with Dr. [name].

## 2012-02-20 NOTE — Telephone Encounter (Signed)
Spoke with pt and told him he would be monitored during colonoscopy and that he could remove event monitor during procedure and put on after procedure completed.

## 2012-02-20 NOTE — Telephone Encounter (Signed)
New Problem:    Patient called in because he is going to have a colonoscopy this upcomming Monday 617/10 and would like to know if it would be ok with his heart monitor.  Please call back.

## 2012-02-21 NOTE — Telephone Encounter (Signed)
Talked with pt; Okay with for pt to have procedure with heart monitor per Dr Ria Clock nurse.

## 2012-02-23 ENCOUNTER — Telehealth: Payer: Self-pay | Admitting: Family Medicine

## 2012-02-23 ENCOUNTER — Telehealth: Payer: Self-pay | Admitting: *Deleted

## 2012-02-23 ENCOUNTER — Encounter: Payer: Medicare HMO | Admitting: Gastroenterology

## 2012-02-23 DIAGNOSIS — Z0000 Encounter for general adult medical examination without abnormal findings: Secondary | ICD-10-CM

## 2012-02-23 NOTE — Telephone Encounter (Signed)
Refill request for Ambien 10 mg take 1 po qhs prn and pt last here on 01/17/12.

## 2012-02-23 NOTE — Telephone Encounter (Signed)
LM FOR PT

## 2012-02-24 MED ORDER — ZOLPIDEM TARTRATE 10 MG PO TABS: 5.0000 mg | ORAL_TABLET | Freq: Every evening | ORAL | Status: DC | PRN

## 2012-02-24 NOTE — Telephone Encounter (Signed)
I called in script 

## 2012-02-24 NOTE — Telephone Encounter (Signed)
Call in a 6 month supply  

## 2012-02-27 ENCOUNTER — Ambulatory Visit (AMBULATORY_SURGERY_CENTER): Payer: Medicare HMO | Admitting: Gastroenterology

## 2012-02-27 ENCOUNTER — Encounter: Payer: Self-pay | Admitting: Gastroenterology

## 2012-02-27 VITALS — BP 131/78 | HR 69 | Temp 98.4°F | Resp 14 | Ht 74.5 in | Wt 196.0 lb

## 2012-02-27 DIAGNOSIS — Z1211 Encounter for screening for malignant neoplasm of colon: Secondary | ICD-10-CM

## 2012-02-27 DIAGNOSIS — Z800 Family history of malignant neoplasm of digestive organs: Secondary | ICD-10-CM

## 2012-02-27 DIAGNOSIS — K5730 Diverticulosis of large intestine without perforation or abscess without bleeding: Secondary | ICD-10-CM

## 2012-02-27 HISTORY — PX: COLONOSCOPY: SHX174

## 2012-02-27 LAB — GLUCOSE, CAPILLARY: Glucose-Capillary: 141 mg/dL — ABNORMAL HIGH (ref 70–99)

## 2012-02-27 MED ORDER — SODIUM CHLORIDE 0.9 % IV SOLN: 500.0000 mL | INTRAVENOUS | Status: DC

## 2012-02-27 NOTE — Patient Instructions (Addendum)
Diverticulosis Diverticulosis is a common condition that develops when small pouches (diverticula) form in the wall of the colon. The risk of diverticulosis increases with age. It happens more often in people who eat a low-fiber diet. Most individuals with diverticulosis have no symptoms. Those individuals with symptoms usually experience abdominal pain, constipation, or loose stools (diarrhea). HOME CARE INSTRUCTIONS   Increase the amount of fiber in your diet as directed by your caregiver or dietician. This may reduce symptoms of diverticulosis.   Your caregiver may recommend taking a dietary fiber supplement.   Drink at least 6 to 8 glasses of water each day to prevent constipation.   Try not to strain when you have a bowel movement.   Your caregiver may recommend avoiding nuts and seeds to prevent complications, although this is still an uncertain benefit.   Only take over-the-counter or prescription medicines for pain, discomfort, or fever as directed by your caregiver.  FOODS WITH HIGH FIBER CONTENT INCLUDE:  Fruits. Apple, peach, pear, tangerine, raisins, prunes.   Vegetables. Brussels sprouts, asparagus, broccoli, cabbage, carrot, cauliflower, romaine lettuce, spinach, summer squash, tomato, winter squash, zucchini.   Starchy Vegetables. Baked beans, kidney beans, lima beans, split peas, lentils, potatoes (with skin).   Grains. Whole wheat bread, brown rice, bran flake cereal, plain oatmeal, white rice, shredded wheat, bran muffins.  SEEK IMMEDIATE MEDICAL CARE IF:   You develop increasing pain or severe bloating.   You have an oral temperature above 102 F (38.9 C), not controlled by medicine.   You develop vomiting or bowel movements that are bloody or black.  Document Released: 05/26/2004 Document Revised: 08/18/2011 Document Reviewed: 01/27/2010 [facility] Patient Information 2012 [place], [place]. Discharge instructions given with verbal understanding. Handouts on  diverticulosis and hemorrhoids given. Resume previous medications. YOU HAD AN ENDOSCOPIC PROCEDURE TODAY AT THE [facility]: Refer to the procedure report that was given to you for any specific questions about what was found during the examination.  If the procedure report does not answer your questions, please call your gastroenterologist to clarify.  If you requested that your care partner not be given the details of your procedure findings, then the procedure report has been included in a sealed envelope for you to review at your convenience later.  YOU SHOULD EXPECT: Some feelings of bloating in the abdomen. Passage of more gas than usual.  Walking can help get rid of the air that was put into your GI tract during the procedure and reduce the bloating. If you had a lower endoscopy (such as a colonoscopy or flexible sigmoidoscopy) you may notice spotting of blood in your stool or on the toilet paper. If you underwent a bowel prep for your procedure, then you may not have a normal bowel movement for a few days.  DIET: Your first meal following the procedure should be a light meal and then it is ok to progress to your normal diet.  A half-sandwich or bowl of soup is an example of a good first meal.  Heavy or fried foods are harder to digest and may make you feel nauseous or bloated.  Likewise meals heavy in dairy and vegetables can cause extra gas to form and this can also increase the bloating.  Drink plenty of fluids but you should avoid alcoholic beverages for 24 hours.  ACTIVITY: Your care partner should take you home directly after the procedure.  You should plan to take it easy, moving slowly for the rest of the day.  You  can resume normal activity the day after the procedure however you should NOT DRIVE or use heavy machinery for 24 hours (because of the sedation medicines used during the test).    SYMPTOMS TO REPORT IMMEDIATELY: A gastroenterologist can be reached at any hour.   During normal business hours, 8:30 AM to 5:00 PM Monday through Friday, call [phone].  After hours and on weekends, please call the GI answering service at [phone] who will take a message and have the physician on call contact you.   Following lower endoscopy (colonoscopy or flexible sigmoidoscopy):  Excessive amounts of blood in the stool  Significant tenderness or worsening of abdominal pains  Swelling of the abdomen that is new, acute  Fever of 100F or higher  FOLLOW UP: If any biopsies were taken you will be contacted by phone or by letter within the next 1-3 weeks.  Call your gastroenterologist if you have not heard about the biopsies in 3 weeks.  Our staff will call the home number listed on your records the next business day following your procedure to check on you and address any questions or concerns that you may have at that time regarding the information given to you following your procedure. This is a courtesy call and so if there is no answer at the home number and we have not heard from you through the emergency physician on call, we will assume that you have returned to your regular daily activities without incident.  SIGNATURES/CONFIDENTIALITY: You and/or your care partner have signed paperwork which will be entered into your electronic medical record.  These signatures attest to the fact that that the information above on your After Visit Summary has been reviewed and is understood.  Full responsibility of the confidentiality of this discharge information lies with you and/or your care-partner.

## 2012-02-27 NOTE — Op Note (Signed)
[facility] [address] [place], [place]  [address]  COLONOSCOPY PROCEDURE REPORT  PATIENT:  [name], [name]  MR#:  [id number] BIRTHDATE:  [birth year], 65 yrs. old  GENDER:  male ENDOSCOPIST:  Barbette Hair. Arlyce Dice, MD REF. BY: PROCEDURE DATE:  02/27/2012 PROCEDURE:  Diagnostic Colonoscopy ASA CLASS:  Class II INDICATIONS:  Screening, family history of colon cancer father MEDICATIONS:   MAC sedation, administered by CRNA propofol 130mg IV  DESCRIPTION OF PROCEDURE:   After the risks benefits and alternatives of the procedure were thoroughly explained, informed consent was obtained.  Digital rectal exam was performed and revealed no abnormalities.   The LB [id number] endoscope was introduced through the anus and advanced to the cecum, which was identified by both the appendix and ileocecal valve, without limitations.  The quality of the prep was excellent, using MoviPrep.  The instrument was then slowly withdrawn as the colon was fully examined. <<PROCEDUREIMAGES>>  FINDINGS:  Moderate diverticulosis was found in the sigmoid colon (see image2).  Internal Hemorrhoids were found (see image4).  This was otherwise a normal examination of the colon (see image1). Retroflexed views in the rectum revealed no abnormalities.    The time to cecum =  1) 3.0  minutes. The scope was then withdrawn in 1) 6.50  minutes from the cecum and the procedure completed. COMPLICATIONS:  None ENDOSCOPIC IMPRESSION: 1) Moderate diverticulosis in the sigmoid colon 2) Internal hemorrhoids 3) Otherwise normal examination RECOMMENDATIONS: 1) Given your significant family history of colon cancer, you should have a repeat colonoscopy in 5 years REPEAT EXAM:  In 5 year(s) for Colonoscopy.  ______________________________ Barbette Hair. Arlyce Dice, MD  CC:  Nelwyn Salisbury, MD  n. Rosalie DoctorBarbette Hair. Courtnay Petrilla at 02/27/2012 10:42 AM  Brynda Greathouse, [phone]

## 2012-02-27 NOTE — Progress Notes (Signed)
Patient did not experience any of the following events: a burn prior to discharge; a fall within the facility; wrong site/side/patient/procedure/implant event; or a hospital transfer or hospital admission upon discharge from the facility. (G8907) Patient did not have preoperative order for IV antibiotic SSI prophylaxis. (G8918)  

## 2012-02-28 ENCOUNTER — Telehealth: Payer: Self-pay | Admitting: *Deleted

## 2012-02-28 NOTE — Telephone Encounter (Signed)
  Follow up Call-  Call back number 02/27/2012  Post procedure Call Back phone  # [phone]  Permission to leave phone message Yes     Patient questions:  Do you have a fever, pain , or abdominal swelling? no Pain Score  0 *  Have you tolerated food without any problems? yes  Have you been able to return to your normal activities? yes  Do you have any questions about your discharge instructions: Diet   no Medications  no Follow up visit  no  Do you have questions or concerns about your Care? no  Actions: * If pain score is 4 or above: No action needed, pain <4.

## 2012-03-02 DIAGNOSIS — H3020 Posterior cyclitis, unspecified eye: Secondary | ICD-10-CM | POA: Insufficient documentation

## 2012-03-02 DIAGNOSIS — D849 Immunodeficiency, unspecified: Secondary | ICD-10-CM | POA: Insufficient documentation

## 2012-03-23 ENCOUNTER — Ambulatory Visit (INDEPENDENT_AMBULATORY_CARE_PROVIDER_SITE_OTHER): Payer: Medicare HMO | Admitting: Cardiovascular Disease

## 2012-03-23 ENCOUNTER — Encounter: Payer: Self-pay | Admitting: Cardiovascular Disease

## 2012-03-23 VITALS — BP 126/70 | HR 77 | Ht 74.0 in | Wt 194.0 lb

## 2012-03-23 DIAGNOSIS — I4949 Other premature depolarization: Secondary | ICD-10-CM

## 2012-03-23 DIAGNOSIS — R002 Palpitations: Secondary | ICD-10-CM

## 2012-03-23 DIAGNOSIS — I493 Ventricular premature depolarization: Secondary | ICD-10-CM

## 2012-03-23 NOTE — Progress Notes (Signed)
History of Present Illness: 66 yo [name] with history of HLD, HTN, DM, gout here today for new cardiac follow up. He was seen as a new patient in May 2013 for evaluation of dizziness. He is followed by Dr. Clent Ridges in primary care. He has had recent episodes of dizziness without syncope, especially when bending over such as to place a golf tee. He also has noted some palpitations. This is described as feeling his heart flutter or race. He has noticed this for years. 48 hour Holter monitor with NSR with PVCs with couplets and one episode of 2:1 AV block, Mobitz type 1. No chest pains, SOB. No syncope. His dizziness only happens after he bends over. When I saw him, he was feeling well. I arranged an echo and a 21 day event monitor. His echo showed normal LV size and function with no significant valvular issues. His event monitor did not show heart block. There was PACs, PVCs.   He is here for follow up. He still has some awareness of palpitations but these are not bad. No dizziness or syncope.   Primary Care Physician: Gershon Crane, MD    Past Medical History  Diagnosis Date  . Gout   . Diverticulosis of colon   . Hyperlipidemia   . Hypertension   . Diabetes mellitus     type II  . ED (erectile dysfunction)   . Normal cardiac stress test 09-27-02  . Uveitis     sees Dr Peggyann Shoals at [facility]    Past Surgical History  Procedure Date  . Appendectomy   . Tonsillectomy   . Shoulder arthroscopy     both shoulders   . Cataract extraction     Dr Dione Booze , left eye    Current Outpatient Prescriptions  Medication Sig Dispense Refill  . AGAMATRIX ULTRA-THIN LANCETS MISC 1 each by Does not apply route daily.  100 each  3  . allopurinol (ZYLOPRIM) 300 MG tablet TAKE 1 TABLET DAILY  90 tablet  2  . aspirin [name] MG tablet Take [name] mg by mouth daily.        . calcium-vitamin D (OSCAL WITH D) 500-200 MG-UNIT per tablet Take 2 tablets by mouth daily.        . CRESTOR 20 MG tablet TAKE 1 TABLET DAILY  90  tablet  3  . DORZOLAMIDE HCL OP Apply 1 drop to eye every other day. Each eye      . folic acid (FOLVITE) 1 MG tablet TAKE 1 TABLET DAILY  90 tablet  2  . glucose blood (AGAMATRIX PRESTO TEST) test strip Test once a day  100 each  3  . lisinopril (PRINIVIL,ZESTRIL) 20 MG tablet Take 0.5 tablets (10 mg total) by mouth daily.  90 tablet  2  . metFORMIN (GLUCOPHAGE) 1000 MG tablet Take 1 tablet (1,000 mg total) by mouth 2 (two) times daily with a meal.  180 tablet  9  . methotrexate (RHEUMATREX) 2.5 MG tablet 2.5 mg once a week. Take 4 tablets once a week      . Omega-3 Fatty Acids (FISH OIL PO) Take by mouth daily.      . prednisoLONE sodium phosphate (INFLAMASE FORTE) 1 % ophthalmic solution Place 1 drop into both eyes every other day.       . zolpidem (AMBIEN) 10 MG tablet Take 0.5 tablets (5 mg total) by mouth at bedtime as needed.  30 tablet  5    Allergies  Allergen Reactions  .  Red Dye     hives    History   Social History  . Marital Status: Married    Spouse Name: N/A    Number of Children: 1  . Years of Education: N/A   Occupational History  . Marine scientist    Social History Main Topics  . Smoking status: Former Smoker -- 1.0 packs/day for 8 years    Types: Cigarettes    Quit date: 02/06/1970  . Smokeless tobacco: Never Used  . Alcohol Use: 2.5 oz/week    5 drink(s) per week  . Drug Use: No  . Sexually Active: Not on file   Other Topics Concern  . Not on file   Social History Narrative  . No narrative on file    Family History  Problem Relation Age of Onset  . Heart attack Father 40  . Kidney disease Father   . Heart disease Father   . Colon cancer Father   . Heart attack Paternal Grandmother   . Stomach cancer Neg Hx   . Thyroid cancer Mother   . Heart disease Mother   . Thyroid cancer Brother   . Diabetes Paternal Grandfather     Review of Systems:  As stated in the HPI and otherwise negative.   BP 126/70  Pulse 77  Ht 6\' 2"  (1.88 m)  Wt 194  lb (87.998 kg)  BMI 24.91 kg/m2  Physical Examination: General: Well developed, well nourished, NAD HEENT: OP clear, mucus membranes moist SKIN: warm, dry. No rashes. Neuro: No focal deficits Musculoskeletal: Muscle strength 5/5 all ext Psychiatric: Mood and affect normal Neck: No JVD, no carotid bruits, no thyromegaly, no lymphadenopathy. Lungs:Clear bilaterally, no wheezes, rhonci, crackles Cardiovascular: Regular rate and rhythm. No murmurs, gallops or rubs. Abdomen:Soft. Bowel sounds present. Non-tender.  Extremities: No lower extremity edema. Pulses are 2 + in the bilateral DP/PT.  Echo: 02/14/12: Left ventricle: The cavity size was normal. Wall thickness was normal. Systolic function was normal. The estimated ejection fraction was in the range of 55% to 60%. - Aortic valve: Trivial regurgitation. - Mitral valve: Nodular calcification of anterior mitral leaflet tip - Left atrium: The atrium was mildly dilated. - Atrial septum: No defect or patent foramen ovale was identified.

## 2012-03-23 NOTE — Assessment & Plan Note (Signed)
Likely related to PVCs. No evidence of heart block on monitor. He is asked to avoid stimulants such as caffeine, nicotine. No medication changes.

## 2012-03-23 NOTE — Patient Instructions (Addendum)
Your physician wants you to follow-up in:  12 months.  You will receive a reminder letter in the mail two months in advance. If you don't receive a letter, please call our office to schedule the follow-up appointment.   

## 2012-04-01 ENCOUNTER — Ambulatory Visit (HOSPITAL_BASED_OUTPATIENT_CLINIC_OR_DEPARTMENT_OTHER)
Admission: AD | Admit: 2012-04-01 | Discharge: 2012-04-01 | Disposition: A | Discharge status: Home / Self Care | Payer: Managed Care, Other (non HMO) | Source: Ambulatory Visit | Attending: Emergency Medicine | Admitting: Emergency Medicine

## 2012-04-01 ENCOUNTER — Other Ambulatory Visit (HOSPITAL_COMMUNITY): Payer: Self-pay | Admitting: Family Medicine

## 2012-04-01 DIAGNOSIS — K5792 Diverticulitis of intestine, part unspecified, without perforation or abscess without bleeding: Secondary | ICD-10-CM

## 2012-04-02 ENCOUNTER — Ambulatory Visit (HOSPITAL_COMMUNITY)
Admission: RE | Admit: 2012-04-02 | Discharge: 2012-04-02 | Disposition: A | Discharge status: Home / Self Care | Payer: Managed Care, Other (non HMO) | Source: Ambulatory Visit | Attending: Family Medicine | Admitting: Family Medicine

## 2012-04-02 ENCOUNTER — Telehealth: Payer: Self-pay | Admitting: Family Medicine

## 2012-04-02 DIAGNOSIS — I728 Aneurysm of other specified arteries: Secondary | ICD-10-CM | POA: Insufficient documentation

## 2012-04-02 DIAGNOSIS — K5792 Diverticulitis of intestine, part unspecified, without perforation or abscess without bleeding: Secondary | ICD-10-CM

## 2012-04-02 DIAGNOSIS — I2510 Atherosclerotic heart disease of native coronary artery without angina pectoris: Secondary | ICD-10-CM | POA: Insufficient documentation

## 2012-04-02 DIAGNOSIS — K5732 Diverticulitis of large intestine without perforation or abscess without bleeding: Secondary | ICD-10-CM | POA: Insufficient documentation

## 2012-04-02 DIAGNOSIS — R197 Diarrhea, unspecified: Secondary | ICD-10-CM | POA: Insufficient documentation

## 2012-04-02 DIAGNOSIS — R109 Unspecified abdominal pain: Secondary | ICD-10-CM | POA: Insufficient documentation

## 2012-04-02 LAB — CREATININE, SERUM: Creatinine, Ser: 1.06 mg/dL (ref 0.50–1.35)

## 2012-04-02 MED ORDER — IOHEXOL 300 MG/ML  SOLN
100.0000 mL | Freq: Once | INTRAMUSCULAR | Status: AC | PRN
  Administered 2012-04-02: 100 mL via INTRAVENOUS

## 2012-04-02 NOTE — Telephone Encounter (Signed)
I spoke with pt  

## 2012-04-02 NOTE — Telephone Encounter (Signed)
Caller: [name] [name]/Patient; Phone Number: [phone]; Message from caller: Pt states he went to [facility] yesterday because [facility] was so busy.  The physician Dr.  Melven Sartorius ordered a CT of the abdomen, which he did today at [facility].  He wants Dr.  Clent Ridges to have results and f/u with him based on results. He is waiting to know what is going on.  He thinks he signed papers for results to come to Dr.  Clent Ridges because he is his PCP.

## 2012-04-02 NOTE — Telephone Encounter (Signed)
I did review the CT scan report and he does have some diverticulitis. Tell him to take the antibiotics he was given and to see me when they are finished

## 2012-04-08 ENCOUNTER — Other Ambulatory Visit: Payer: Self-pay | Admitting: Family Medicine

## 2012-04-12 ENCOUNTER — Encounter: Payer: Self-pay | Admitting: Family Medicine

## 2012-04-12 ENCOUNTER — Ambulatory Visit (INDEPENDENT_AMBULATORY_CARE_PROVIDER_SITE_OTHER): Payer: Managed Care, Other (non HMO) | Admitting: Family Medicine

## 2012-04-12 VITALS — BP 120/64 | HR 79 | Temp 98.5°F | Wt 191.0 lb

## 2012-04-12 DIAGNOSIS — K5792 Diverticulitis of intestine, part unspecified, without perforation or abscess without bleeding: Secondary | ICD-10-CM

## 2012-04-12 DIAGNOSIS — K5732 Diverticulitis of large intestine without perforation or abscess without bleeding: Secondary | ICD-10-CM

## 2012-04-12 NOTE — Progress Notes (Signed)
  Subjective:    Patient ID: [name] [name], male    DOB: 06-23-1946, 66 y.o.   MRN: [id number]  HPI Here to follow up on diverticulitis. He was seen in Urgent Care on 03-31-12 for abdominal pain and was diagnosed with diverticulitis. He was given a 10 day course of Cipro and Flagyl. This was confirmed by a CT scan on 04-01-12. He took the last of the antibiotics yesterday. Today he feels better and is eating a full diet. He still has some slight pains in the abdomen but no fever. His BMs are loose but almost back to normal.  Review of Systems  Constitutional: Negative.   Gastrointestinal: Positive for abdominal pain. Negative for nausea, vomiting, diarrhea, constipation, blood in stool, abdominal distention and rectal pain.       Objective:   Physical Exam  Constitutional: He appears well-developed and well-nourished.  Abdominal: Soft. Bowel sounds are normal. He exhibits no distension and no mass. There is no rebound and no guarding.       He is slightly tender around the umbilicus           Assessment & Plan:  His diverticulitis is greatly improved. We will see how he does over the weekend. Recheck next week prn

## 2012-05-11 ENCOUNTER — Other Ambulatory Visit: Payer: Self-pay | Admitting: Family Medicine

## 2012-05-28 ENCOUNTER — Ambulatory Visit (INDEPENDENT_AMBULATORY_CARE_PROVIDER_SITE_OTHER): Payer: Managed Care, Other (non HMO)

## 2012-05-28 DIAGNOSIS — Z23 Encounter for immunization: Secondary | ICD-10-CM

## 2012-05-29 ENCOUNTER — Ambulatory Visit: Payer: Managed Care, Other (non HMO) | Admitting: Family Medicine

## 2012-06-14 ENCOUNTER — Other Ambulatory Visit: Payer: Self-pay | Admitting: Family Medicine

## 2012-07-03 DIAGNOSIS — Z79899 Other long term (current) drug therapy: Secondary | ICD-10-CM | POA: Insufficient documentation

## 2012-07-07 ENCOUNTER — Other Ambulatory Visit: Payer: Self-pay | Admitting: Family Medicine

## 2012-09-17 ENCOUNTER — Telehealth: Payer: Self-pay | Admitting: Family Medicine

## 2012-09-17 NOTE — Telephone Encounter (Signed)
Patient Information:  Caller Name: Khing  Phone: [phone]  Patient: [name] [name], [name] [name]  Gender: Male  DOB: [birth year]  Age: 67 Years  PCP: Gershon Crane [facility])  Office Follow Up:  Does the office need to follow up with this patient?: Yes  Instructions For The Office: See RN Notes section  RN Note:  Appointments are full with Dr. Clent Ridges this afternoon. Please call patient back and let him know if you can work him in for an appointment this afternoon.  Symptoms  Reason For Call & Symptoms: Reports back pain, urinary frequency/urgency. Reports green tint color to urine. Denies bleeding.  Reviewed Health History In EMR: Yes  Reviewed Medications In EMR: Yes  Reviewed Allergies In EMR: Yes  Reviewed Surgeries / Procedures: Yes  Date of Onset of Symptoms: 09/15/2012  Treatments Tried: Ibuprofen and Aleve  Treatments Tried Worked: No  Any Fever: Yes  Fever Taken: Tactile  Fever Time Of Reading: 15:03:28  Fever Last Reading: N/A  Guideline(s) Used:  Urination Pain - Male  Disposition Per Guideline:   Go to Office Now  Reason For Disposition Reached:   Side (flank) or lower back pain present  Advice Given:  N/A

## 2012-09-17 NOTE — Telephone Encounter (Signed)
RN attempted to contact patient, left a voice mail.   ° °

## 2012-09-17 NOTE — Telephone Encounter (Signed)
Per Dr. Clent Ridges, okay for pt to be seen on 09/18/12 or he could go to urgent care. We are full this afternoon. I spoke with pt and he is going to come in tomorrow.

## 2012-09-17 NOTE — Telephone Encounter (Signed)
Please ask and advise about working in today, per CAN.

## 2012-09-18 ENCOUNTER — Ambulatory Visit (INDEPENDENT_AMBULATORY_CARE_PROVIDER_SITE_OTHER): Payer: Managed Care, Other (non HMO) | Admitting: Family Medicine

## 2012-09-18 ENCOUNTER — Encounter: Payer: Self-pay | Admitting: Family Medicine

## 2012-09-18 VITALS — BP 130/70 | Temp 98.2°F | Wt 197.0 lb

## 2012-09-18 DIAGNOSIS — N390 Urinary tract infection, site not specified: Secondary | ICD-10-CM

## 2012-09-18 LAB — POCT URINALYSIS DIPSTICK
Ketones, UA: NEGATIVE
Nitrite, UA: NEGATIVE
Protein, UA: NEGATIVE
Urobilinogen, UA: 0.2
pH, UA: 6

## 2012-09-18 MED ORDER — CIPROFLOXACIN HCL 500 MG PO TABS: 500.0000 mg | ORAL_TABLET | Freq: Two times a day (BID) | ORAL | Status: DC

## 2012-09-18 NOTE — Addendum Note (Signed)
Addended by: Aniceto Boss A on: 09/18/2012 02:06 PM   Modules accepted: Orders

## 2012-09-18 NOTE — Progress Notes (Signed)
  Subjective:    Patient ID: [name] Jaksch., male    DOB: 1946/09/04, 67 y.o.   MRN: [id number]  HPI Here for 3 days of low back aches, urgency to urinate, and burning. No nausea or fever. The last UTI he had was in August 2011.    Review of Systems  Constitutional: Negative.   Gastrointestinal: Negative.   Genitourinary: Positive for dysuria, urgency and frequency. Negative for hematuria and flank pain.       Objective:   Physical Exam  Constitutional: He appears well-developed and well-nourished. No distress.  Abdominal: Soft. Bowel sounds are normal. He exhibits no distension. There is no tenderness. There is no rebound and no guarding.          Assessment & Plan:  Start on Cipro and culture the urine

## 2012-09-20 LAB — URINE CULTURE
Colony Count: NO GROWTH
Organism ID, Bacteria: NO GROWTH

## 2012-09-21 NOTE — Progress Notes (Signed)
Quick Note:  I spoke with pt ______ 

## 2012-10-24 ENCOUNTER — Other Ambulatory Visit: Payer: Managed Care, Other (non HMO)

## 2012-10-25 ENCOUNTER — Other Ambulatory Visit: Payer: Managed Care, Other (non HMO)

## 2012-10-26 ENCOUNTER — Other Ambulatory Visit: Payer: Managed Care, Other (non HMO)

## 2012-10-31 ENCOUNTER — Other Ambulatory Visit (INDEPENDENT_AMBULATORY_CARE_PROVIDER_SITE_OTHER): Payer: Managed Care, Other (non HMO)

## 2012-10-31 DIAGNOSIS — Z0000 Encounter for general adult medical examination without abnormal findings: Secondary | ICD-10-CM

## 2012-10-31 LAB — POCT URINALYSIS DIPSTICK
Bilirubin, UA: NEGATIVE
Blood, UA: NEGATIVE
Ketones, UA: NEGATIVE
Spec Grav, UA: 1.01
pH, UA: 6

## 2012-10-31 LAB — CBC WITH DIFFERENTIAL/PLATELET
Basophils Absolute: 0 10*3/uL (ref 0.0–0.1)
Basophils Relative: 0.4 % (ref 0.0–3.0)
Eosinophils Absolute: 0.2 10*3/uL (ref 0.0–0.7)
Hemoglobin: 14.9 g/dL (ref 13.0–17.0)
Lymphocytes Relative: 24.6 % (ref 12.0–46.0)
MCHC: 33.6 g/dL (ref 30.0–36.0)
Monocytes Relative: 7.8 % (ref 3.0–12.0)
Neutrophils Relative %: 64.1 % (ref 43.0–77.0)
RBC: 4.58 Mil/uL (ref 4.22–5.81)

## 2012-10-31 LAB — MICROALBUMIN / CREATININE URINE RATIO: Microalb Creat Ratio: 0.5 mg/g (ref 0.0–30.0)

## 2012-10-31 LAB — BASIC METABOLIC PANEL
CO2: 27 mEq/L (ref 19–32)
Calcium: 9.7 mg/dL (ref 8.4–10.5)
Creatinine, Ser: 1 mg/dL (ref 0.4–1.5)
GFR: 82.94 mL/min (ref >60.00)
Sodium: 137 mEq/L (ref 135–145)

## 2012-10-31 LAB — LIPID PANEL
Cholesterol: 115 mg/dL (ref 0–200)
HDL: 46.2 mg/dL (ref >39.00)
Triglycerides: 70 mg/dL (ref 0.0–149.0)
VLDL: 14 mg/dL (ref 0.0–40.0)

## 2012-10-31 LAB — HEPATIC FUNCTION PANEL
AST: 31 U/L (ref 0–37)
Alkaline Phosphatase: 54 U/L (ref 39–117)
Bilirubin, Direct: 0.2 mg/dL (ref 0.0–0.3)
Total Protein: 6.8 g/dL (ref 6.0–8.3)

## 2012-10-31 LAB — PSA: PSA: 1.03 ng/mL (ref 0.10–4.00)

## 2012-11-01 ENCOUNTER — Ambulatory Visit (INDEPENDENT_AMBULATORY_CARE_PROVIDER_SITE_OTHER): Payer: Managed Care, Other (non HMO) | Admitting: Family Medicine

## 2012-11-01 ENCOUNTER — Encounter: Payer: Self-pay | Admitting: Family Medicine

## 2012-11-01 VITALS — BP 124/70 | HR 89 | Temp 98.4°F | Ht 73.5 in | Wt 195.0 lb

## 2012-11-01 DIAGNOSIS — Z0000 Encounter for general adult medical examination without abnormal findings: Secondary | ICD-10-CM

## 2012-11-01 MED ORDER — ROSUVASTATIN CALCIUM 20 MG PO TABS: ORAL_TABLET | ORAL | Status: DC

## 2012-11-01 MED ORDER — ZOLPIDEM TARTRATE 10 MG PO TABS: 5.0000 mg | ORAL_TABLET | Freq: Every evening | ORAL | Status: DC | PRN

## 2012-11-01 MED ORDER — METFORMIN HCL 1000 MG PO TABS: ORAL_TABLET | ORAL | Status: DC

## 2012-11-01 MED ORDER — ALLOPURINOL 300 MG PO TABS: 300.0000 mg | ORAL_TABLET | Freq: Every day | ORAL | Status: DC

## 2012-11-01 NOTE — Progress Notes (Signed)
  Subjective:    Patient ID: [name] Jaksch., male    DOB: 02/23/1946, 67 y.o.   MRN: [id number]  HPI 67 yr old male for a cpx. He feels fine and has no concerns. He is seeing Dr. 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 This is responding well to treatment.    Review of Systems  Constitutional: Negative.   HENT: Negative.   Eyes: Negative.   Respiratory: Negative.   Cardiovascular: Negative.   Gastrointestinal: Negative.   Genitourinary: Negative.   Musculoskeletal: Negative.   Skin: Negative.   Neurological: Negative.   Psychiatric/Behavioral: Negative.        Objective:   Physical Exam  Constitutional: He is oriented to person, place, and time. He appears well-developed and well-nourished. No distress.  HENT:  Head: Normocephalic and atraumatic.  Right Ear: External ear normal.  Left Ear: External ear normal.  Nose: Nose normal.  Mouth/Throat: Oropharynx is clear and moist. No oropharyngeal exudate.  Eyes: Conjunctivae and EOM are normal. Pupils are equal, round, and reactive to light. Right eye exhibits no discharge. Left eye exhibits no discharge. No scleral icterus.  Neck: Neck supple. No JVD present. No tracheal deviation present. No thyromegaly present.  Cardiovascular: Normal rate, regular rhythm, normal heart sounds and intact distal pulses.  Exam reveals no gallop and no friction rub.   No murmur heard. Pulmonary/Chest: Effort normal and breath sounds normal. No respiratory distress. He has no wheezes. He has no rales. He exhibits no tenderness.  Abdominal: Soft. Bowel sounds are normal. He exhibits no distension and no mass. There is no tenderness. There is no rebound and no guarding.  Genitourinary: Rectum normal, prostate normal and penis normal. Guaiac negative stool. No penile tenderness.  Musculoskeletal: Normal range of motion. He exhibits no edema and no tenderness.  Lymphadenopathy:    He has no cervical adenopathy.  Neurological: He is  alert and oriented to person, place, and time. He has normal reflexes. No cranial nerve deficit. He exhibits normal muscle tone. Coordination normal.  Skin: Skin is warm and dry. No rash noted. He is not diaphoretic. No erythema. No pallor.  Psychiatric: He has a normal mood and affect. His behavior is normal. Judgment and thought content normal.          Assessment & Plan:  Well exam.

## 2012-11-18 ENCOUNTER — Other Ambulatory Visit: Payer: Self-pay | Admitting: Family Medicine

## 2012-11-27 ENCOUNTER — Other Ambulatory Visit: Payer: Self-pay | Admitting: Family Medicine

## 2013-04-10 ENCOUNTER — Other Ambulatory Visit: Payer: Self-pay | Admitting: Family Medicine

## 2013-04-11 ENCOUNTER — Other Ambulatory Visit: Payer: Self-pay | Admitting: Family Medicine

## 2013-04-17 ENCOUNTER — Telehealth: Payer: Self-pay | Admitting: Family Medicine

## 2013-04-17 ENCOUNTER — Other Ambulatory Visit: Payer: Self-pay

## 2013-04-17 DIAGNOSIS — E119 Type 2 diabetes mellitus without complications: Secondary | ICD-10-CM

## 2013-04-17 DIAGNOSIS — E785 Hyperlipidemia, unspecified: Secondary | ICD-10-CM

## 2013-04-17 NOTE — Telephone Encounter (Signed)
PT states that he is to have quarterly labs done, along with fu appts. There are currently no orders in the system. Please assist.

## 2013-04-17 NOTE — Telephone Encounter (Signed)
I left a voice message with below information. 

## 2013-04-17 NOTE — Telephone Encounter (Signed)
Orders were placed

## 2013-05-20 ENCOUNTER — Telehealth: Payer: Self-pay | Admitting: Family Medicine

## 2013-05-20 NOTE — Telephone Encounter (Signed)
Please get some more information about this. Who sent the letter? Who is he supposed to see?

## 2013-05-20 NOTE — Telephone Encounter (Signed)
Pt wants to know if he needs a Wellness type exam with [facility]? He got some paper work in the mail offering to schedule this visit. He is due for his CPE here in February 2014. It appears to be the same type of visit. Please advise?

## 2013-05-21 ENCOUNTER — Other Ambulatory Visit: Payer: Self-pay | Admitting: Family Medicine

## 2013-05-21 NOTE — Telephone Encounter (Signed)
He does not need see anyone at [facility] for this since we will do a cpx here. This would be merely a duplication of exams.

## 2013-05-21 NOTE — Telephone Encounter (Signed)
Call in #[phone] with one rf 

## 2013-05-21 NOTE — Telephone Encounter (Signed)
I spoke with pt  

## 2013-07-09 DIAGNOSIS — M542 Cervicalgia: Secondary | ICD-10-CM | POA: Insufficient documentation

## 2013-08-16 ENCOUNTER — Other Ambulatory Visit: Payer: Self-pay | Admitting: Family Medicine

## 2013-09-20 ENCOUNTER — Telehealth: Payer: Self-pay | Admitting: Family Medicine

## 2013-09-20 NOTE — Telephone Encounter (Signed)
I received a fax from [place] denying Crestor.  Pt must try and fail at least one of the following: lovastatin, pravastatin, simvastatin, atorvastatin, or Caduet (atorvastatin/amlodippine).

## 2013-09-20 NOTE — Telephone Encounter (Signed)
Tell them he has tried and failed both simvastatin and atorvastatin

## 2013-09-24 ENCOUNTER — Encounter: Payer: Self-pay | Admitting: Family Medicine

## 2013-09-25 ENCOUNTER — Telehealth: Payer: Self-pay | Admitting: Family Medicine

## 2013-09-25 MED ORDER — ATORVASTATIN CALCIUM 80 MG PO TABS: 80.0000 mg | ORAL_TABLET | Freq: Every day | ORAL | Status: DC

## 2013-09-25 NOTE — Telephone Encounter (Signed)
I sent script e-scribe and left a message for pt. 

## 2013-09-25 NOTE — Telephone Encounter (Signed)
Pt stated that his insurance will not cover Crestor any longer, can you recommend something else?

## 2013-09-25 NOTE — Telephone Encounter (Signed)
Stop Crestor and switch to Lipitor 80 mg daily. Call in one year supply

## 2013-09-26 NOTE — Telephone Encounter (Signed)
Appeal has been submitted

## 2013-11-01 ENCOUNTER — Other Ambulatory Visit (INDEPENDENT_AMBULATORY_CARE_PROVIDER_SITE_OTHER): Payer: Managed Care, Other (non HMO)

## 2013-11-01 DIAGNOSIS — Z0000 Encounter for general adult medical examination without abnormal findings: Secondary | ICD-10-CM

## 2013-11-01 DIAGNOSIS — E785 Hyperlipidemia, unspecified: Secondary | ICD-10-CM

## 2013-11-01 DIAGNOSIS — E119 Type 2 diabetes mellitus without complications: Secondary | ICD-10-CM

## 2013-11-01 LAB — POCT URINALYSIS DIPSTICK
Bilirubin, UA: NEGATIVE
GLUCOSE UA: NEGATIVE
Ketones, UA: NEGATIVE
Leukocytes, UA: NEGATIVE
NITRITE UA: NEGATIVE
PH UA: 7
Protein, UA: NEGATIVE
RBC UA: NEGATIVE
Spec Grav, UA: 1.015
Urobilinogen, UA: 0.2

## 2013-11-01 LAB — BASIC METABOLIC PANEL
BUN: 16 mg/dL (ref 6–23)
CALCIUM: 9.6 mg/dL (ref 8.4–10.5)
CO2: 30 mEq/L (ref 19–32)
Chloride: 102 mEq/L (ref 96–112)
Creatinine, Ser: 0.9 mg/dL (ref 0.4–1.5)
GFR: 91.43 mL/min (ref >60.00)
Glucose, Bld: 151 mg/dL — ABNORMAL HIGH (ref 70–99)
Potassium: 4.7 mEq/L (ref 3.5–5.1)
Sodium: 137 mEq/L (ref 135–145)

## 2013-11-01 LAB — LIPID PANEL
CHOLESTEROL: 130 mg/dL (ref 0–200)
HDL: 38.6 mg/dL — ABNORMAL LOW (ref >39.00)
LDL Cholesterol: 59 mg/dL (ref 0–99)
Total CHOL/HDL Ratio: 3
Triglycerides: 162 mg/dL — ABNORMAL HIGH (ref 0.0–149.0)
VLDL: 32.4 mg/dL (ref 0.0–40.0)

## 2013-11-01 LAB — CBC WITH DIFFERENTIAL/PLATELET
BASOS ABS: 0 10*3/uL (ref 0.0–0.1)
Basophils Relative: 0.3 % (ref 0.0–3.0)
EOS ABS: 0.3 10*3/uL (ref 0.0–0.7)
Eosinophils Relative: 4.6 % (ref 0.0–5.0)
HCT: 46.5 % (ref 39.0–52.0)
HEMOGLOBIN: 15.7 g/dL (ref 13.0–17.0)
LYMPHS PCT: 22 % (ref 12.0–46.0)
Lymphs Abs: 1.3 10*3/uL (ref 0.7–4.0)
MCHC: 33.7 g/dL (ref 30.0–36.0)
MCV: 102 fl — ABNORMAL HIGH (ref 78.0–100.0)
Monocytes Absolute: 0.5 10*3/uL (ref 0.1–1.0)
Monocytes Relative: 7.8 % (ref 3.0–12.0)
NEUTROS ABS: 3.8 10*3/uL (ref 1.4–7.7)
NEUTROS PCT: 65.3 % (ref 43.0–77.0)
Platelets: 149 10*3/uL — ABNORMAL LOW (ref 150.0–400.0)
RBC: 4.56 Mil/uL (ref 4.22–5.81)
RDW: 13 % (ref 11.5–14.6)
WBC: 5.8 10*3/uL (ref 4.5–10.5)

## 2013-11-01 LAB — HEPATIC FUNCTION PANEL
ALK PHOS: 53 U/L (ref 39–117)
ALT: 52 U/L (ref 0–53)
AST: 42 U/L — ABNORMAL HIGH (ref 0–37)
Albumin: 4.2 g/dL (ref 3.5–5.2)
Bilirubin, Direct: 0.2 mg/dL (ref 0.0–0.3)
TOTAL PROTEIN: 6.5 g/dL (ref 6.0–8.3)
Total Bilirubin: 1 mg/dL (ref 0.3–1.2)

## 2013-11-01 LAB — TSH: TSH: 1.55 u[IU]/mL (ref 0.35–5.50)

## 2013-11-01 LAB — MICROALBUMIN / CREATININE URINE RATIO
Creatinine,U: 61.6 mg/dL
MICROALB UR: 0.5 mg/dL (ref 0.0–1.9)
Microalb Creat Ratio: 0.8 mg/g (ref 0.0–30.0)

## 2013-11-01 LAB — HEMOGLOBIN A1C: Hgb A1c MFr Bld: 7.1 % — ABNORMAL HIGH (ref 4.6–6.5)

## 2013-11-01 LAB — PSA: PSA: 1.81 ng/mL (ref 0.10–4.00)

## 2013-11-07 ENCOUNTER — Encounter: Payer: Managed Care, Other (non HMO) | Admitting: Family Medicine

## 2013-11-15 ENCOUNTER — Other Ambulatory Visit: Payer: Self-pay | Admitting: Family Medicine

## 2013-11-20 ENCOUNTER — Ambulatory Visit (INDEPENDENT_AMBULATORY_CARE_PROVIDER_SITE_OTHER): Payer: Managed Care, Other (non HMO) | Admitting: Family Medicine

## 2013-11-20 ENCOUNTER — Ambulatory Visit: Payer: Managed Care, Other (non HMO) | Admitting: Family Medicine

## 2013-11-20 ENCOUNTER — Encounter: Payer: Self-pay | Admitting: Family Medicine

## 2013-11-20 VITALS — BP 110/66 | HR 80 | Temp 98.4°F | Ht 73.0 in | Wt 195.0 lb

## 2013-11-20 DIAGNOSIS — Z0000 Encounter for general adult medical examination without abnormal findings: Secondary | ICD-10-CM

## 2013-11-20 DIAGNOSIS — E785 Hyperlipidemia, unspecified: Secondary | ICD-10-CM

## 2013-11-20 DIAGNOSIS — R102 Pelvic and perineal pain: Secondary | ICD-10-CM

## 2013-11-20 MED ORDER — SILVER SULFADIAZINE 1 % EX CREA: 1.0000 "application " | TOPICAL_CREAM | Freq: Two times a day (BID) | CUTANEOUS | Status: DC

## 2013-11-20 NOTE — Progress Notes (Signed)
Pre visit review using our clinic review tool, if applicable. No additional management support is needed unless otherwise documented below in the visit note. 

## 2013-11-20 NOTE — Progress Notes (Signed)
   Subjective:    Patient ID: [name] [name]., male    DOB: 02/22/1946, 68 y.o.   MRN: [id number]  HPI 68 yr old male for a cpx. He has only one concern. For the past 3 months he has had intermittent mild sharp pains in the lower abdomen. These last about 3 to 4 days at a time and then go away. No urinary sx or fever. His BMs are normal. He has had diverticulitis before but this pain feels very different. He asks me to check a wound on the left lower leg where he had a skin lesion frozen off by Dr. Jarome Matin 8 days ago.    Review of Systems  Constitutional: Negative.   HENT: Negative.   Eyes: Negative.   Respiratory: Negative.   Cardiovascular: Negative.   Gastrointestinal: Positive for abdominal pain. Negative for nausea, vomiting, diarrhea, constipation, blood in stool, abdominal distention, anal bleeding and rectal pain.  Genitourinary: Negative.   Musculoskeletal: Negative.   Skin: Negative.   Neurological: Negative.   Psychiatric/Behavioral: Negative.        Objective:   Physical Exam  Constitutional: He is oriented to person, place, and time. He appears well-developed and well-nourished. No distress.  HENT:  Head: Normocephalic and atraumatic.  Right Ear: External ear normal.  Left Ear: External ear normal.  Nose: Nose normal.  Mouth/Throat: Oropharynx is clear and moist. No oropharyngeal exudate.  Eyes: Conjunctivae and EOM are normal. Pupils are equal, round, and reactive to light. Right eye exhibits no discharge. Left eye exhibits no discharge. No scleral icterus.  Neck: Neck supple. No JVD present. No tracheal deviation present. No thyromegaly present.  Cardiovascular: Normal rate, regular rhythm, normal heart sounds and intact distal pulses.  Exam reveals no gallop and no friction rub.   No murmur heard. EKG normal   Pulmonary/Chest: Effort normal and breath sounds normal. No respiratory distress. He has no wheezes. He has no rales. He exhibits no tenderness.    Abdominal: Soft. Bowel sounds are normal. He exhibits no distension and no mass. There is no rebound and no guarding.  He is tender just above the pubis and in both lower quadrants   Genitourinary: Rectum normal and penis normal. Guaiac negative stool. No penile tenderness.  Prostate mildly enlarged but not tender   Musculoskeletal: Normal range of motion. He exhibits no edema and no tenderness.  Lymphadenopathy:    He has no cervical adenopathy.  Neurological: He is alert and oriented to person, place, and time. He has normal reflexes. No cranial nerve deficit. He exhibits normal muscle tone. Coordination normal.  Skin: Skin is warm and dry. No rash noted. He is not diaphoretic. No erythema. No pallor.  Left lower leg has a small wound surrounded by erythema. No drainage but this is quite tender.   Psychiatric: He has a normal mood and affect. His behavior is normal. Judgment and thought content normal.          Assessment & Plan:  Well exam. Recommended he use Metamucil daily. Use Silvadene on the left lower leg wound. His abdominal pain may be from adhesions stemming from his old appendectomy, but we will get a CT scan to evaluate.

## 2013-11-29 ENCOUNTER — Other Ambulatory Visit: Payer: Self-pay | Admitting: Family Medicine

## 2013-11-29 ENCOUNTER — Ambulatory Visit (INDEPENDENT_AMBULATORY_CARE_PROVIDER_SITE_OTHER)
Admission: RE | Admit: 2013-11-29 | Discharge: 2013-11-29 | Disposition: A | Discharge status: Home / Self Care | Payer: Managed Care, Other (non HMO) | Source: Ambulatory Visit | Attending: Family Medicine | Admitting: Family Medicine

## 2013-11-29 DIAGNOSIS — R1031 Right lower quadrant pain: Secondary | ICD-10-CM

## 2013-11-29 DIAGNOSIS — R102 Pelvic and perineal pain: Secondary | ICD-10-CM

## 2013-11-29 MED ORDER — IOHEXOL 300 MG/ML  SOLN
100.0000 mL | Freq: Once | INTRAMUSCULAR | Status: AC | PRN
  Administered 2013-11-29: 100 mL via INTRAVENOUS

## 2013-12-05 ENCOUNTER — Encounter: Payer: Self-pay | Admitting: Family Medicine

## 2013-12-13 ENCOUNTER — Other Ambulatory Visit: Payer: Self-pay | Admitting: Family Medicine

## 2013-12-14 ENCOUNTER — Other Ambulatory Visit: Payer: Self-pay | Admitting: Family Medicine

## 2013-12-16 NOTE — Telephone Encounter (Signed)
Ok to give 30 days worth  Disp 30 # PCP to do the rest .

## 2014-02-15 ENCOUNTER — Other Ambulatory Visit: Payer: Self-pay | Admitting: Family Medicine

## 2014-03-13 ENCOUNTER — Other Ambulatory Visit: Payer: Self-pay | Admitting: Family Medicine

## 2014-03-17 ENCOUNTER — Other Ambulatory Visit: Payer: Self-pay | Admitting: Family Medicine

## 2014-03-18 ENCOUNTER — Encounter: Payer: Self-pay | Admitting: Family Medicine

## 2014-03-18 ENCOUNTER — Ambulatory Visit (INDEPENDENT_AMBULATORY_CARE_PROVIDER_SITE_OTHER): Payer: Managed Care, Other (non HMO) | Admitting: Family Medicine

## 2014-03-18 VITALS — BP 128/69 | HR 75 | Temp 98.8°F | Ht 73.0 in | Wt 195.0 lb

## 2014-03-18 DIAGNOSIS — M545 Low back pain, unspecified: Secondary | ICD-10-CM

## 2014-03-18 DIAGNOSIS — N390 Urinary tract infection, site not specified: Secondary | ICD-10-CM

## 2014-03-18 MED ORDER — CYCLOBENZAPRINE HCL 10 MG PO TABS: 10.0000 mg | ORAL_TABLET | Freq: Three times a day (TID) | ORAL | Status: DC | PRN

## 2014-03-18 MED ORDER — DICLOFENAC SODIUM 75 MG PO TBEC: 75.0000 mg | DELAYED_RELEASE_TABLET | Freq: Two times a day (BID) | ORAL | Status: DC

## 2014-03-18 MED ORDER — METHYLPREDNISOLONE 4 MG PO KIT: PACK | ORAL | Status: DC

## 2014-03-18 NOTE — Progress Notes (Signed)
   Subjective:    Patient ID: [name] [name]., male    DOB: [birth year], 68 y.o.   MRN: [id number]  HPI Here for middle and lower back pain. On 03-15-14 while he was vacationing in [place], [place] he had a massage in which his legs were twisted and stretched more ythan usual. He felt tight that night and the next morning he was in a lot of pain. He has known degenerative disc disease from an MRI in 2009. Also he had some urinary pressure and frequency last week and he went to Urgent Care, where he was diagnosed with a "kidney infection". He was started on Bactrim DS and he feels much better.    Review of Systems  Constitutional: Negative.   Genitourinary: Negative.   Musculoskeletal: Positive for back pain.       Objective:   Physical Exam  Constitutional: He appears well-developed and well-nourished.  Walking a bit stiffly   Musculoskeletal:  Very tender along the thoracic and lumbar spines. Some spasm is present and ROM is limited.           Assessment & Plan:  Use heat, Flexeril, and a Medrol dose pack for the back pain. Finish out the Bactrim. Recheck prn

## 2014-03-18 NOTE — Progress Notes (Signed)
Pre visit review using our clinic review tool, if applicable. No additional management support is needed unless otherwise documented below in the visit note. 

## 2014-03-19 NOTE — Telephone Encounter (Signed)
Pt requesting a 90 day supply, okay per Dr. Sarajane Jews to call in.

## 2014-03-19 NOTE — Telephone Encounter (Signed)
Call in #[phone] with 5 rf 

## 2014-03-29 ENCOUNTER — Other Ambulatory Visit: Payer: Self-pay | Admitting: Internal Medicine

## 2014-05-05 ENCOUNTER — Other Ambulatory Visit: Payer: Self-pay | Admitting: Orthopedic Surgery

## 2014-05-05 DIAGNOSIS — M48061 Spinal stenosis, lumbar region without neurogenic claudication: Secondary | ICD-10-CM

## 2014-05-07 ENCOUNTER — Ambulatory Visit
Admission: RE | Admit: 2014-05-07 | Discharge: 2014-05-07 | Disposition: A | Discharge status: Home / Self Care | Payer: Managed Care, Other (non HMO) | Source: Ambulatory Visit | Attending: Orthopedic Surgery | Admitting: Orthopedic Surgery

## 2014-05-07 DIAGNOSIS — M48061 Spinal stenosis, lumbar region without neurogenic claudication: Secondary | ICD-10-CM

## 2014-05-11 ENCOUNTER — Other Ambulatory Visit: Payer: Self-pay | Admitting: Family Medicine

## 2014-05-28 ENCOUNTER — Other Ambulatory Visit: Payer: Self-pay | Admitting: Family Medicine

## 2014-06-25 ENCOUNTER — Telehealth: Payer: Self-pay | Admitting: Family Medicine

## 2014-06-25 NOTE — Telephone Encounter (Signed)
We received a fax from [place], pt's insurance is no longer going to cover Freestyle Lite, however they will cover One Touch. I need to know if pt wants a new script sent in? I left a voice message for pt to return my call.

## 2014-06-27 NOTE — Telephone Encounter (Signed)
I left another message for pt to either call back or send a message through my chart with question from below answered.

## 2014-07-02 IMAGING — CT CT ABD-PELV W/ CM
2 of 5 series · 16 of 46 positions shown, 18 images · IV contrast (Omnipaque 300)
Comparison: CT ABD/PELVIS W CM dated 04/02/2012; US SCROTUM dated
10/16/2009

ADDENDUM:
Please note, as described in the technique section, the patient may
have experienced an allergic reaction to intravenous contrast which
was treated with Benadryl. This should be considered prior to
subsequent contrast administration.
CLINICAL DATA: Intermittent right lower quadrant abdominal pain for
3 months.

EXAM:
CT ABDOMEN AND PELVIS WITH CONTRAST
TECHNIQUE: Multidetector CT imaging of the abdomen and pelvis was performed
using the standard protocol following bolus administration of
intravenous contrast.
CONTRAST:  100mL OMNIPAQUE IOHEXOL 300 MG/ML SOLN. The patient is
reported to have developed itching and hives after IV contrast
administration. He was treated with 50 mg of oral Benadryl.

[Series 2: abd/ pel 5mm · axial · 0.74mm/px · z∈[-482,-26]mm · 13 of 103 slices shown, 15 images]
[im 6/103  soft-tissue]
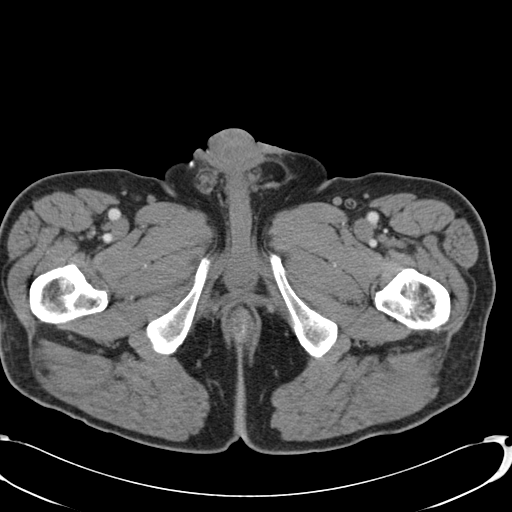
[im 6/103  bone]
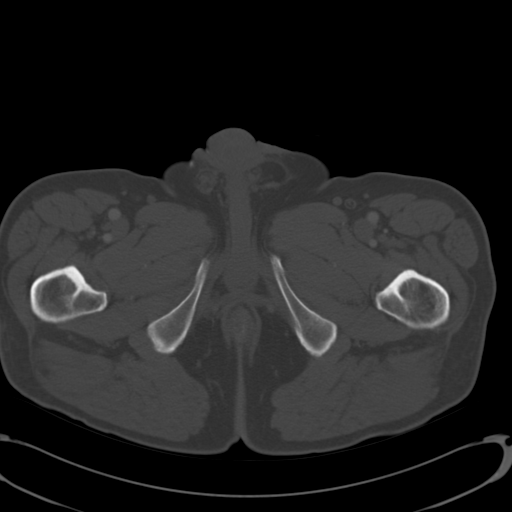
[im 16/103  soft-tissue]
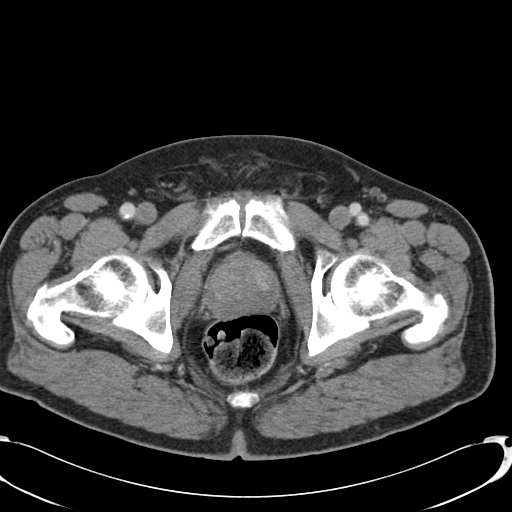
[im 21/103  soft-tissue]
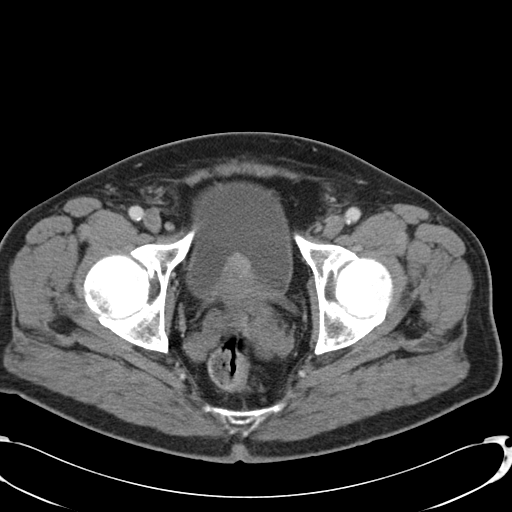
[im 31/103  soft-tissue]
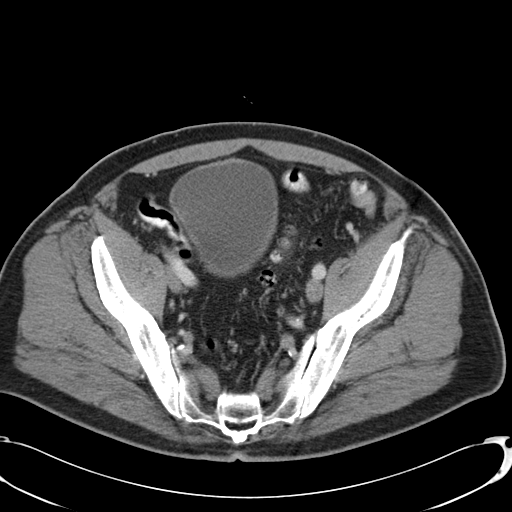
[im 36/103  soft-tissue]
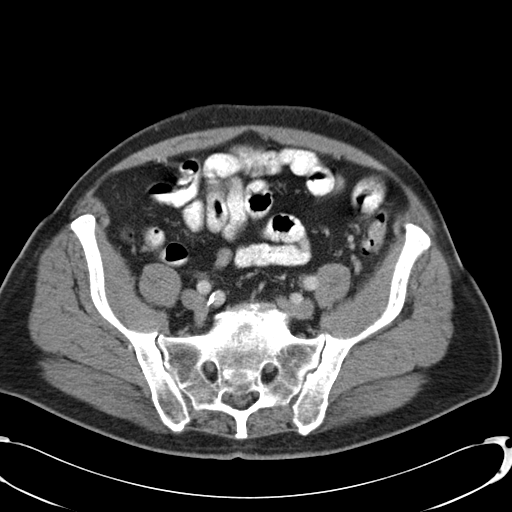
[im 46/103  soft-tissue]
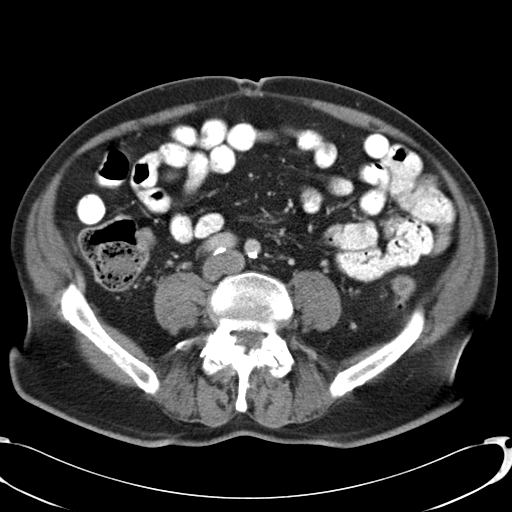
[im 52/103  soft-tissue]
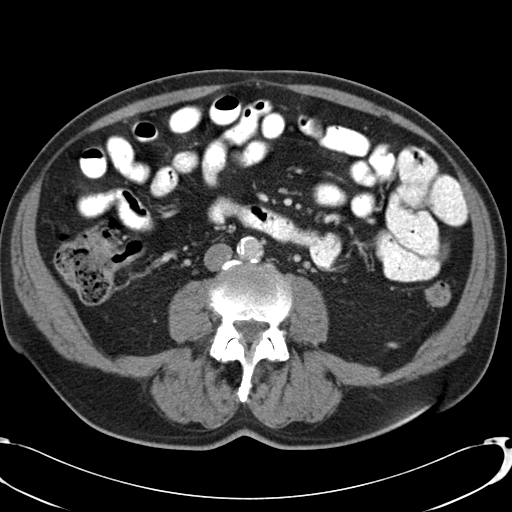
[im 57/103  soft-tissue]
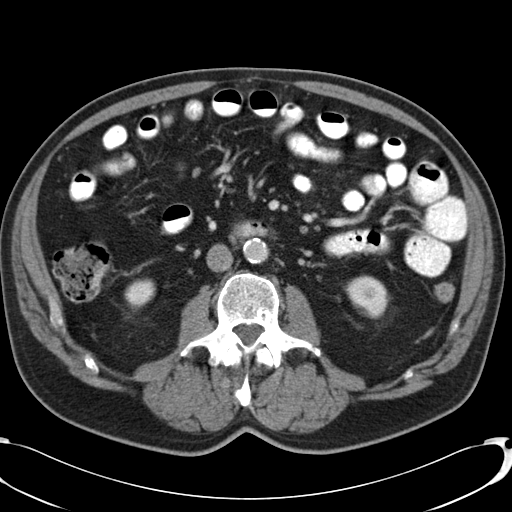
[im 67/103  soft-tissue]
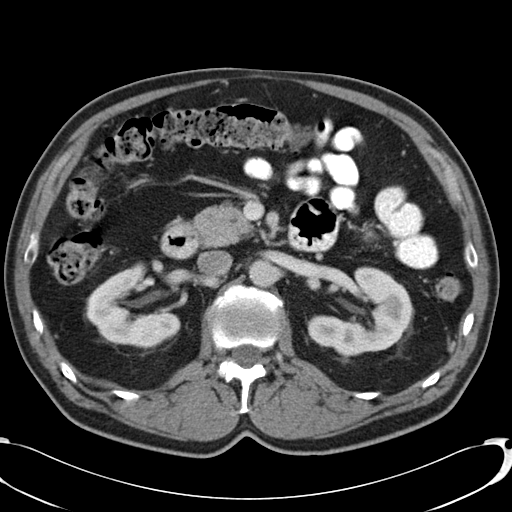
[im 67/103  bone]
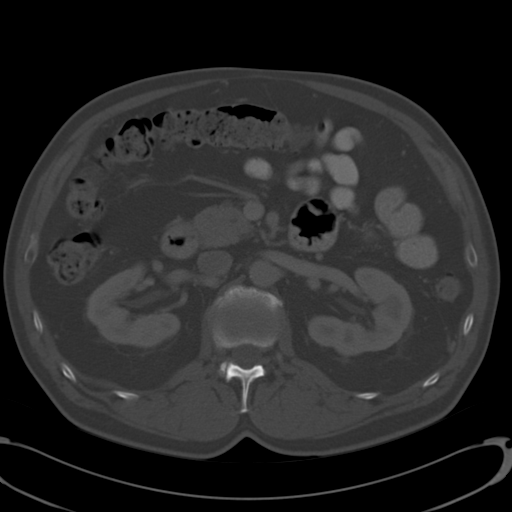
[im 72/103  soft-tissue]
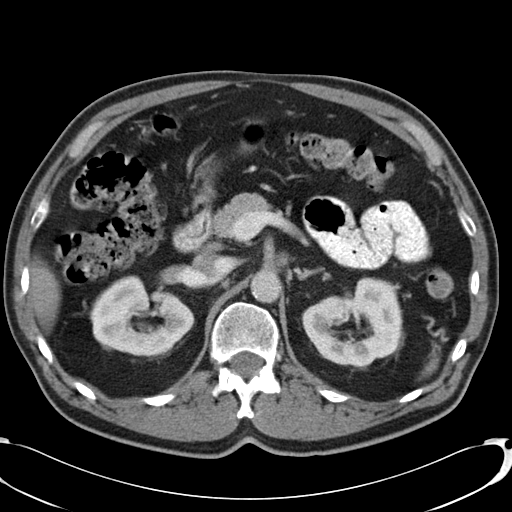
[im 82/103  soft-tissue]
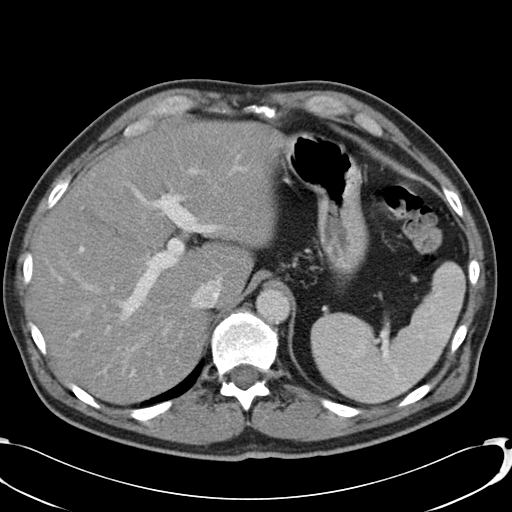
[im 87/103  soft-tissue]
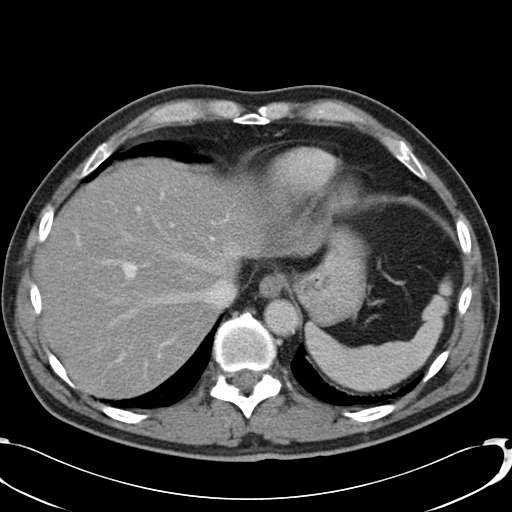
[im 97/103  soft-tissue]
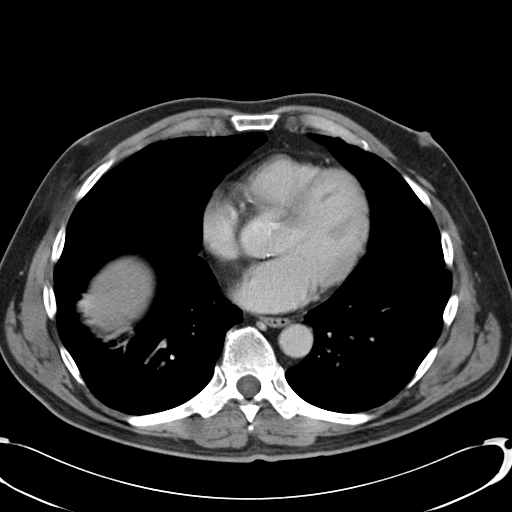

[Series 602: cor · coronal · 1.03mm/px · 3 of 139 slices shown]
[im 47/139  soft-tissue]
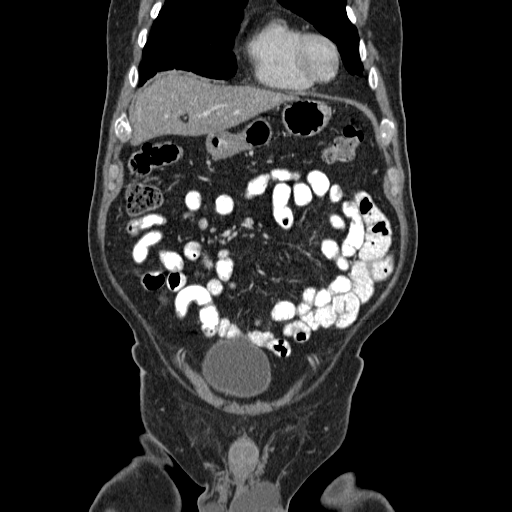
[im 62/139  soft-tissue]
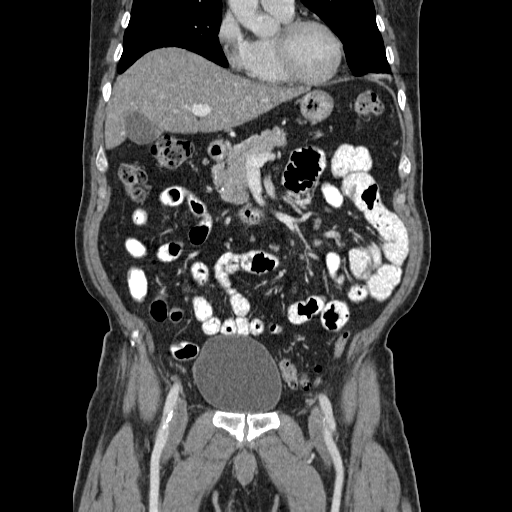
[im 77/139  soft-tissue]
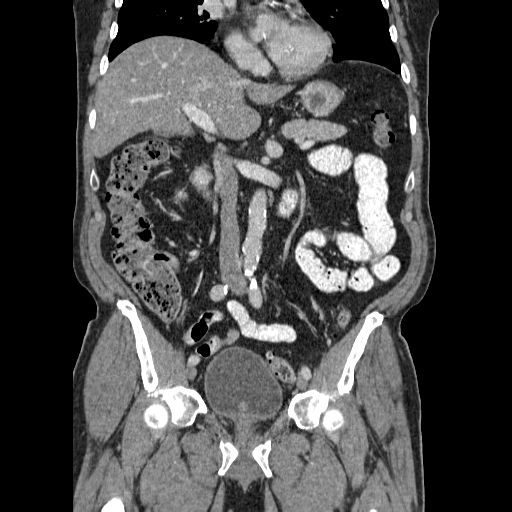

[16 of 46 positions shown; findings below may reference images not displayed]

FINDINGS: There is stable atelectasis or scarring in both lung bases
associated with asymmetric elevation of the right hemidiaphragm.
There is no significant pleural or pericardial effusion. Coronary
artery calcifications and a small hiatal hernia are noted.

The liver, spleen, gallbladder, pancreas, biliary system and adrenal
glands appear stable without suspicious findings.

1.9 cm lesion involving the upper pole of the left kidney on image
31 has an intermediate density, although has slightly decreased in
size from the prior study and is therefore likely a complex cyst.
There is a stable small cyst in the upper pole of the right kidney.
There is no hydronephrosis.

The stomach, small bowel and proximal colon appear normal. The
appendix is not visualized, although there is no pericecal
inflammatory change. Sigmoid colon diverticular changes are noted
without evidence of recurrent diverticulitis.

The prostate gland is moderately enlarged with protrusion of a
central prostatic nodule into the bladder base. The urinary bladder
appears normal. There is no pelvic lymphadenopathy.

Aortoiliac atherosclerosis and lumbar spondylosis are stable. There
is stable aneurysmal dilatation of the celiac trunk 1.5 cm on image
30. No abdominal wall hernias are identified. Mildly prominent fat
in both inguinal canals appears stable. Fluid density in the left
scrotum is noted, possibly a hydrocele.

There are no worrisome osseous findings. Lumbar spine degenerative
changes are noted.
IMPRESSION: 1. No acute findings or explanation for right lower quadrant
abdominal pain demonstrated. The appendix is not visualized.
2. Sigmoid diverticulosis without evidence of recurrent
inflammation.
3. Similar bilateral renal cysts.
4. Atherosclerosis with stable dilatation of the celiac trunk.
5. Stable central enlargement of the prostate gland.

## 2014-09-17 ENCOUNTER — Other Ambulatory Visit: Payer: Self-pay | Admitting: Family Medicine

## 2014-09-18 NOTE — Telephone Encounter (Signed)
Call in #[phone] with one rf 

## 2014-10-07 ENCOUNTER — Ambulatory Visit (INDEPENDENT_AMBULATORY_CARE_PROVIDER_SITE_OTHER): Payer: Managed Care, Other (non HMO) | Admitting: Family Medicine

## 2014-10-07 ENCOUNTER — Encounter: Payer: Self-pay | Admitting: Family Medicine

## 2014-10-07 VITALS — BP 158/83 | HR 77 | Temp 98.9°F | Ht 73.0 in | Wt 204.0 lb

## 2014-10-07 DIAGNOSIS — L089 Local infection of the skin and subcutaneous tissue, unspecified: Secondary | ICD-10-CM

## 2014-10-07 DIAGNOSIS — L918 Other hypertrophic disorders of the skin: Secondary | ICD-10-CM

## 2014-10-07 NOTE — Progress Notes (Signed)
   Subjective:    Patient ID: [name] [name]., male    DOB: 07/13/1946, 69 y.o.   MRN: [id number]  HPI Here for several days of a tender lesion on the neck. This had been present for months but only recently became irritated.    Review of Systems  Constitutional: Negative.        Objective:   Physical Exam  Constitutional: He appears well-developed and well-nourished.  Skin:  There is a small inflamed skin tag on the left side of the neck           Assessment & Plan:  This was excised with scissors. Dressed with Neosporin and a Bandaid. Recheck prn

## 2014-10-07 NOTE — Progress Notes (Signed)
Pre visit review using our clinic review tool, if applicable. No additional management support is needed unless otherwise documented below in the visit note. 

## 2014-10-29 ENCOUNTER — Other Ambulatory Visit (INDEPENDENT_AMBULATORY_CARE_PROVIDER_SITE_OTHER): Payer: Managed Care, Other (non HMO)

## 2014-10-29 DIAGNOSIS — Z0000 Encounter for general adult medical examination without abnormal findings: Secondary | ICD-10-CM

## 2014-10-29 LAB — BASIC METABOLIC PANEL
BUN: 23 mg/dL (ref 6–23)
CO2: 27 meq/L (ref 19–32)
CREATININE: 0.94 mg/dL (ref 0.40–1.50)
Calcium: 9.7 mg/dL (ref 8.4–10.5)
Chloride: 100 mEq/L (ref 96–112)
GFR: 84.48 mL/min (ref >60.00)
GLUCOSE: 158 mg/dL — AB (ref 70–99)
Potassium: 4.3 mEq/L (ref 3.5–5.1)
SODIUM: 136 meq/L (ref 135–145)

## 2014-10-29 LAB — CBC WITH DIFFERENTIAL/PLATELET
BASOS ABS: 0 10*3/uL (ref 0.0–0.1)
Basophils Relative: 0.6 % (ref 0.0–3.0)
Eosinophils Absolute: 0.3 10*3/uL (ref 0.0–0.7)
Eosinophils Relative: 5.4 % — ABNORMAL HIGH (ref 0.0–5.0)
HCT: 46.6 % (ref 39.0–52.0)
Hemoglobin: 16.1 g/dL (ref 13.0–17.0)
LYMPHS ABS: 1.2 10*3/uL (ref 0.7–4.0)
Lymphocytes Relative: 24.1 % (ref 12.0–46.0)
MCHC: 34.5 g/dL (ref 30.0–36.0)
MCV: 94.8 fl (ref 78.0–100.0)
MONO ABS: 0.5 10*3/uL (ref 0.1–1.0)
Monocytes Relative: 10.2 % (ref 3.0–12.0)
Neutro Abs: 2.9 10*3/uL (ref 1.4–7.7)
Neutrophils Relative %: 59.7 % (ref 43.0–77.0)
PLATELETS: 159 10*3/uL (ref 150.0–400.0)
RBC: 4.91 Mil/uL (ref 4.22–5.81)
RDW: 12.7 % (ref 11.5–15.5)
WBC: 4.8 10*3/uL (ref 4.0–10.5)

## 2014-10-29 LAB — HEPATIC FUNCTION PANEL
ALBUMIN: 4.3 g/dL (ref 3.5–5.2)
ALT: 33 U/L (ref 0–53)
AST: 23 U/L (ref 0–37)
Alkaline Phosphatase: 68 U/L (ref 39–117)
Bilirubin, Direct: 0.2 mg/dL (ref 0.0–0.3)
Total Bilirubin: 0.9 mg/dL (ref 0.2–1.2)
Total Protein: 6.6 g/dL (ref 6.0–8.3)

## 2014-10-29 LAB — MICROALBUMIN / CREATININE URINE RATIO
Creatinine,U: 103.1 mg/dL
MICROALB UR: 1.7 mg/dL (ref 0.0–1.9)
Microalb Creat Ratio: 1.6 mg/g (ref 0.0–30.0)

## 2014-10-29 LAB — POCT URINALYSIS DIP (MANUAL ENTRY)
Bilirubin, UA: NEGATIVE
Blood, UA: NEGATIVE
Glucose, UA: NEGATIVE
Ketones, POC UA: NEGATIVE
Leukocytes, UA: NEGATIVE
Nitrite, UA: NEGATIVE
PH UA: 5.5
PROTEIN UA: NEGATIVE
SPEC GRAV UA: 1.01
Urobilinogen, UA: 0.2

## 2014-10-29 LAB — LIPID PANEL
CHOL/HDL RATIO: 4
CHOLESTEROL: 123 mg/dL (ref 0–200)
HDL: 34.9 mg/dL — ABNORMAL LOW (ref >39.00)
LDL CALC: 54 mg/dL (ref 0–99)
NONHDL: 88.1
Triglycerides: 172 mg/dL — ABNORMAL HIGH (ref 0.0–149.0)
VLDL: 34.4 mg/dL (ref 0.0–40.0)

## 2014-10-29 LAB — HEMOGLOBIN A1C: HEMOGLOBIN A1C: 7.6 % — AB (ref 4.6–6.5)

## 2014-10-29 LAB — PSA: PSA: 2.34 ng/mL (ref 0.10–4.00)

## 2014-10-29 LAB — TSH: TSH: 1.53 u[IU]/mL (ref 0.35–4.50)

## 2014-11-04 ENCOUNTER — Ambulatory Visit (INDEPENDENT_AMBULATORY_CARE_PROVIDER_SITE_OTHER): Payer: Managed Care, Other (non HMO) | Admitting: Family Medicine

## 2014-11-04 ENCOUNTER — Encounter: Payer: Self-pay | Admitting: Family Medicine

## 2014-11-04 VITALS — BP 120/67 | HR 69 | Temp 98.4°F | Ht 73.0 in | Wt 203.0 lb

## 2014-11-04 DIAGNOSIS — E785 Hyperlipidemia, unspecified: Secondary | ICD-10-CM

## 2014-11-04 DIAGNOSIS — Z0000 Encounter for general adult medical examination without abnormal findings: Secondary | ICD-10-CM

## 2014-11-04 DIAGNOSIS — Z23 Encounter for immunization: Secondary | ICD-10-CM

## 2014-11-04 NOTE — Progress Notes (Signed)
   Subjective:    Patient ID: [name] [name]., male    DOB: May 31, 1946, 69 y.o.   MRN: [id number]  HPI 70 yr old male for a cpx. He feels well and has no complaints.    Review of Systems  Constitutional: Negative.   HENT: Negative.   Eyes: Negative.   Respiratory: Negative.   Cardiovascular: Negative.   Gastrointestinal: Negative.   Genitourinary: Negative.   Musculoskeletal: Negative.   Skin: Negative.   Neurological: Negative.   Psychiatric/Behavioral: Negative.        Objective:   Physical Exam  Constitutional: He is oriented to person, place, and time. He appears well-developed and well-nourished. No distress.  HENT:  Head: Normocephalic and atraumatic.  Right Ear: External ear normal.  Left Ear: External ear normal.  Nose: Nose normal.  Mouth/Throat: Oropharynx is clear and moist. No oropharyngeal exudate.  Eyes: Conjunctivae and EOM are normal. Pupils are equal, round, and reactive to light. Right eye exhibits no discharge. Left eye exhibits no discharge. No scleral icterus.  Neck: Neck supple. No JVD present. No tracheal deviation present. No thyromegaly present.  Cardiovascular: Normal rate, regular rhythm, normal heart sounds and intact distal pulses.  Exam reveals no gallop and no friction rub.   No murmur heard. EKG shows stable first degree AVB   Pulmonary/Chest: Effort normal and breath sounds normal. No respiratory distress. He has no wheezes. He has no rales. He exhibits no tenderness.  Abdominal: Soft. Bowel sounds are normal. He exhibits no distension and no mass. There is no tenderness. There is no rebound and no guarding.  Genitourinary: Rectum normal, prostate normal and penis normal. Guaiac negative stool. No penile tenderness.  Musculoskeletal: Normal range of motion. He exhibits no edema or tenderness.  Lymphadenopathy:    He has no cervical adenopathy.  Neurological: He is alert and oriented to person, place, and time. He has normal reflexes.  No cranial nerve deficit. He exhibits normal muscle tone. Coordination normal.  Skin: Skin is warm and dry. No rash noted. He is not diaphoretic. No erythema. No pallor.  Psychiatric: He has a normal mood and affect. His behavior is normal. Judgment and thought content normal.          Assessment & Plan:  Well exam.

## 2014-11-04 NOTE — Progress Notes (Signed)
Pre visit review using our clinic review tool, if applicable. No additional management support is needed unless otherwise documented below in the visit note. 

## 2014-11-04 NOTE — Addendum Note (Signed)
Addended by: Aggie Hacker A on: 11/04/2014 12:01 PM   Modules accepted: Orders

## 2014-11-16 ENCOUNTER — Other Ambulatory Visit: Payer: Self-pay | Admitting: Family Medicine

## 2014-11-23 ENCOUNTER — Other Ambulatory Visit: Payer: Self-pay | Admitting: Family Medicine

## 2014-12-27 ENCOUNTER — Other Ambulatory Visit: Payer: Self-pay | Admitting: Family Medicine

## 2015-01-21 ENCOUNTER — Other Ambulatory Visit: Payer: Self-pay | Admitting: Family Medicine

## 2015-02-02 ENCOUNTER — Telehealth: Payer: Self-pay | Admitting: Family Medicine

## 2015-02-02 NOTE — Telephone Encounter (Signed)
Request from [facility] to change from freestyle machine to one touch. I spoke with pt and he gave the okay to send in new script for machine, strips, & lancets.

## 2015-02-03 MED ORDER — GLUCOSE BLOOD VI STRP: ORAL_STRIP | Status: DC

## 2015-02-03 MED ORDER — ONETOUCH VERIO FLEX SYSTEM W/DEVICE KIT: 1.0000 | PACK | Freq: Once | Status: DC

## 2015-02-03 NOTE — Telephone Encounter (Signed)
I sent both scripts e-scribe, pt should have lancets.

## 2015-03-09 ENCOUNTER — Other Ambulatory Visit: Payer: Self-pay

## 2015-03-12 ENCOUNTER — Encounter: Payer: Self-pay | Admitting: Family Medicine

## 2015-03-12 ENCOUNTER — Ambulatory Visit (INDEPENDENT_AMBULATORY_CARE_PROVIDER_SITE_OTHER): Payer: Managed Care, Other (non HMO) | Admitting: Family Medicine

## 2015-03-12 VITALS — BP 142/76 | HR 73 | Temp 98.7°F | Ht 73.0 in | Wt 197.0 lb

## 2015-03-12 DIAGNOSIS — L089 Local infection of the skin and subcutaneous tissue, unspecified: Secondary | ICD-10-CM

## 2015-03-12 DIAGNOSIS — S80812A Abrasion, left lower leg, initial encounter: Secondary | ICD-10-CM | POA: Diagnosis not present

## 2015-03-12 DIAGNOSIS — L03116 Cellulitis of left lower limb: Secondary | ICD-10-CM

## 2015-03-12 MED ORDER — TRAMADOL HCL 50 MG PO TABS: 50.0000 mg | ORAL_TABLET | Freq: Four times a day (QID) | ORAL | Status: DC | PRN

## 2015-03-12 MED ORDER — CEPHALEXIN 500 MG PO CAPS: 500.0000 mg | ORAL_CAPSULE | Freq: Four times a day (QID) | ORAL | Status: AC

## 2015-03-12 MED ORDER — MUPIROCIN CALCIUM 2 % EX CREA: 1.0000 "application " | TOPICAL_CREAM | Freq: Two times a day (BID) | CUTANEOUS | Status: DC

## 2015-03-12 NOTE — Progress Notes (Signed)
Pre visit review using our clinic review tool, if applicable. No additional management support is needed unless otherwise documented below in the visit note. 

## 2015-03-12 NOTE — Progress Notes (Signed)
   Subjective:    Patient ID: [name] [name]., male    DOB: 12-01-1945, 68 y.o.   MRN: [id number]  HPI Here for wounds from a fall at home 5 days ago. He was running on a treadmill when he slipped and fell off it. This caused him to scrape the left leg on the side of the machine. He has been treating it since then with H2O2 and Neosporin. The wounds are quite painful and still ooze a bit. No fever.   Review of Systems  Constitutional: Negative.   Respiratory: Negative.   Cardiovascular: Negative.   Skin: Positive for wound.       Objective:   Physical Exam  Constitutional: He appears well-developed and well-nourished.  Cardiovascular: Normal rate, regular rhythm, normal heart sounds and intact distal pulses.   Pulmonary/Chest: Effort normal and breath sounds normal.  Skin:  the left leg has extensive abrasions from the upper thigh down to just above the ankle. These have some granulation tissue coming in at the edges but there is a lot of inflammation and tenderness around them. No streaking.           Assessment & Plan:  Cellulitis around some abrasions. I advised him to stop using H2O2 since this can delay healing. Switch to dressing with Mupiricin cream. Add Keflex 500 mg QID. His tetanus shot is up to date. Recheck prn

## 2015-03-21 ENCOUNTER — Encounter: Payer: Self-pay | Admitting: Family Medicine

## 2015-03-24 ENCOUNTER — Encounter: Payer: Self-pay | Admitting: Family Medicine

## 2015-03-24 ENCOUNTER — Ambulatory Visit (INDEPENDENT_AMBULATORY_CARE_PROVIDER_SITE_OTHER): Payer: Managed Care, Other (non HMO) | Admitting: Family Medicine

## 2015-03-24 VITALS — BP 107/63 | HR 75 | Temp 98.5°F | Ht 73.0 in | Wt 193.0 lb

## 2015-03-24 DIAGNOSIS — M545 Low back pain, unspecified: Secondary | ICD-10-CM

## 2015-03-24 DIAGNOSIS — S80812D Abrasion, left lower leg, subsequent encounter: Secondary | ICD-10-CM

## 2015-03-24 DIAGNOSIS — L089 Local infection of the skin and subcutaneous tissue, unspecified: Secondary | ICD-10-CM

## 2015-03-24 MED ORDER — HYDROCODONE-ACETAMINOPHEN 10-325 MG PO TABS: 1.0000 | ORAL_TABLET | Freq: Four times a day (QID) | ORAL | Status: DC | PRN

## 2015-03-24 NOTE — Progress Notes (Signed)
   Subjective:    Patient ID: [name] [name]., male    DOB: [birth year], 69 y.o.   MRN: [id number]  HPI Here to follow up on leg abrasions and on low back pain. He feel off his treadmill at home and scraped multiple sites on the left leg about a week ago. We saw him and started him on topical Mupiricin ointment and oral Keflex. He then saw an Urgent Care clinic on 03-21-15 and they were worried about possible MRSA (although culture was obtained). They stopped the keflex and started him on a 7 day course of Clindamycin. Since taking this the sites seem to be slowly healing in although they are still a bit painful. Also he has developed some severe low back pain since the fall. He has been taking Norco 5/325 with partial relief. The pain is centered in the right lower back. No radiation to the legs. He had an MRI scan one year ago per Dr. Lynann Bologna that showed multi-level disc disease, but this also showed a loose disc fragment at the L1-2 level which is probably infringing on the right nerve root.    Review of Systems  Constitutional: Negative.   Respiratory: Negative.   Cardiovascular: Negative.   Musculoskeletal: Positive for back pain and gait problem.  Skin: Positive for wound.       Objective:   Physical Exam  Constitutional: He appears well-developed and well-nourished.  Walks stiffly with pain   Cardiovascular: Normal rate, regular rhythm, normal heart sounds and intact distal pulses.   Pulmonary/Chest: Effort normal and breath sounds normal.  Musculoskeletal:  He is very tender over the upper lumbar spine, ROM is reduced but SLR are negative   Skin:  The left leg abrasions are healing in, no drainage seen. Some are a bit tender           Assessment & Plan:  The abrasions are healing in, he will stay on the Clindamycin and Mupiricin regimen. Use Norco for back pain. We will have him see Dr. Sherwood Gambler to evaluate the back pain

## 2015-03-24 NOTE — Progress Notes (Signed)
Pre visit review using our clinic review tool, if applicable. No additional management support is needed unless otherwise documented below in the visit note. 

## 2015-03-25 ENCOUNTER — Encounter: Payer: Self-pay | Admitting: Family Medicine

## 2015-03-29 ENCOUNTER — Other Ambulatory Visit: Payer: Self-pay | Admitting: Family Medicine

## 2015-03-30 NOTE — Telephone Encounter (Signed)
Refill for 6 months. 

## 2015-04-09 ENCOUNTER — Ambulatory Visit (INDEPENDENT_AMBULATORY_CARE_PROVIDER_SITE_OTHER): Payer: Managed Care, Other (non HMO) | Admitting: Family Medicine

## 2015-04-09 ENCOUNTER — Encounter: Payer: Self-pay | Admitting: Family Medicine

## 2015-04-09 VITALS — BP 139/76 | HR 85 | Temp 98.2°F | Ht 73.0 in | Wt 195.0 lb

## 2015-04-09 DIAGNOSIS — N419 Inflammatory disease of prostate, unspecified: Secondary | ICD-10-CM

## 2015-04-09 LAB — POCT URINALYSIS DIPSTICK
BILIRUBIN UA: NEGATIVE
Glucose, UA: 100
KETONES UA: 15
Nitrite, UA: NEGATIVE
PH UA: 6
Spec Grav, UA: >=1.03
Urobilinogen, UA: 0.2

## 2015-04-09 MED ORDER — TAMSULOSIN HCL 0.4 MG PO CAPS: 0.4000 mg | ORAL_CAPSULE | Freq: Two times a day (BID) | ORAL | Status: DC

## 2015-04-09 MED ORDER — CIPROFLOXACIN HCL 750 MG PO TABS: 750.0000 mg | ORAL_TABLET | Freq: Two times a day (BID) | ORAL | Status: DC

## 2015-04-09 NOTE — Addendum Note (Signed)
Addended by: Aggie Hacker A on: 04/09/2015 11:57 AM   Modules accepted: Orders

## 2015-04-09 NOTE — Progress Notes (Signed)
Pre visit review using our clinic review tool, if applicable. No additional management support is needed unless otherwise documented below in the visit note. 

## 2015-04-09 NOTE — Progress Notes (Signed)
   Subjective:    Patient ID: [name] [name]., male    DOB: 1945-12-17, 69 y.o.   MRN: [id number]  HPI Here for 2 days of burning on urination, urgency to urinate, and difficulty passing urine. No fever or nausea or back pain.    Review of Systems  Constitutional: Negative.   Respiratory: Negative.   Cardiovascular: Negative.   Gastrointestinal: Negative.   Genitourinary: Positive for dysuria, urgency, frequency and difficulty urinating. Negative for hematuria and discharge.       Objective:   Physical Exam  Constitutional: He appears well-developed and well-nourished. No distress.  Cardiovascular: Normal rate, regular rhythm, normal heart sounds and intact distal pulses.   Pulmonary/Chest: Effort normal and breath sounds normal.  Abdominal: Soft. Bowel sounds are normal. He exhibits no distension and no mass. There is no tenderness. There is no rebound and no guarding.  Genitourinary:  The prostate is swollen, boggy, and tender           Assessment & Plan:  This is a prostatitis. Drink lots of water. Treat with Cipro and Flomax. Culture the sample

## 2015-04-12 LAB — URINE CULTURE: Colony Count: >=100000

## 2015-04-20 ENCOUNTER — Other Ambulatory Visit: Payer: Self-pay | Admitting: Family Medicine

## 2015-04-27 ENCOUNTER — Encounter: Payer: Self-pay | Admitting: Family Medicine

## 2015-04-28 NOTE — Telephone Encounter (Signed)
Yes generic is fine. Call in a one year supply

## 2015-04-29 MED ORDER — ROSUVASTATIN CALCIUM 20 MG PO TABS: 20.0000 mg | ORAL_TABLET | Freq: Every day | ORAL | Status: DC

## 2015-05-05 DIAGNOSIS — M4310 Spondylolisthesis, site unspecified: Secondary | ICD-10-CM | POA: Insufficient documentation

## 2015-06-11 ENCOUNTER — Other Ambulatory Visit: Payer: Self-pay | Admitting: Family Medicine

## 2015-08-01 ENCOUNTER — Other Ambulatory Visit: Payer: Self-pay | Admitting: Family Medicine

## 2015-08-14 ENCOUNTER — Other Ambulatory Visit: Payer: Self-pay | Admitting: Family Medicine

## 2015-08-14 NOTE — Telephone Encounter (Signed)
Filled on 08/03/15.  Duplicate Request.

## 2015-09-04 ENCOUNTER — Other Ambulatory Visit: Payer: Self-pay | Admitting: Family Medicine

## 2015-09-21 ENCOUNTER — Other Ambulatory Visit: Payer: Self-pay | Admitting: Family Medicine

## 2015-09-21 NOTE — Telephone Encounter (Signed)
Call in #[phone] with one rf 

## 2015-10-17 ENCOUNTER — Encounter: Payer: Self-pay | Admitting: Family Medicine

## 2015-10-19 ENCOUNTER — Other Ambulatory Visit: Payer: Self-pay | Admitting: Family Medicine

## 2015-10-20 MED ORDER — ONETOUCH VERIO W/DEVICE KIT: 1.0000 | PACK | Freq: Once | Status: DC

## 2015-10-21 ENCOUNTER — Other Ambulatory Visit: Payer: Self-pay | Admitting: Family Medicine

## 2015-10-21 ENCOUNTER — Telehealth: Payer: Self-pay | Admitting: Family Medicine

## 2015-10-21 MED ORDER — TAMSULOSIN HCL 0.4 MG PO CAPS: ORAL_CAPSULE | ORAL | Status: DC

## 2015-10-21 NOTE — Telephone Encounter (Signed)
I sent script e-scribe. 

## 2015-10-21 NOTE — Telephone Encounter (Signed)
Refill request for Tamsulosin 0.4 mg take bid and send to [facility].

## 2015-10-24 ENCOUNTER — Other Ambulatory Visit: Payer: Self-pay | Admitting: Family Medicine

## 2015-10-26 ENCOUNTER — Encounter: Payer: Self-pay | Admitting: Family Medicine

## 2015-10-29 NOTE — Telephone Encounter (Signed)
Please call in a one year supply of the lancets. Also tell him not to worry about the glucoses until his cpx, but he needs to strictly reduce his intake of carbs

## 2015-10-30 MED ORDER — LANCETS MISC: Status: DC

## 2015-11-04 ENCOUNTER — Other Ambulatory Visit (INDEPENDENT_AMBULATORY_CARE_PROVIDER_SITE_OTHER): Payer: Managed Care, Other (non HMO)

## 2015-11-04 DIAGNOSIS — Z0000 Encounter for general adult medical examination without abnormal findings: Secondary | ICD-10-CM | POA: Diagnosis not present

## 2015-11-04 LAB — BASIC METABOLIC PANEL
BUN: 23 mg/dL (ref 6–23)
CHLORIDE: 100 meq/L (ref 96–112)
CO2: 29 meq/L (ref 19–32)
Calcium: 9.3 mg/dL (ref 8.4–10.5)
Creatinine, Ser: 0.92 mg/dL (ref 0.40–1.50)
GFR: 86.35 mL/min (ref >60.00)
GLUCOSE: 148 mg/dL — AB (ref 70–99)
Potassium: 4.8 mEq/L (ref 3.5–5.1)
SODIUM: 138 meq/L (ref 135–145)

## 2015-11-04 LAB — MICROALBUMIN / CREATININE URINE RATIO
Creatinine,U: 122.7 mg/dL
Microalb Creat Ratio: 2.2 mg/g (ref 0.0–30.0)
Microalb, Ur: 2.7 mg/dL — ABNORMAL HIGH (ref 0.0–1.9)

## 2015-11-04 LAB — LIPID PANEL
CHOL/HDL RATIO: 3
CHOLESTEROL: 109 mg/dL (ref 0–200)
HDL: 31.5 mg/dL — AB (ref >39.00)
LDL CALC: 43 mg/dL (ref 0–99)
NonHDL: 77
TRIGLYCERIDES: 168 mg/dL — AB (ref 0.0–149.0)
VLDL: 33.6 mg/dL (ref 0.0–40.0)

## 2015-11-04 LAB — HEPATIC FUNCTION PANEL
ALBUMIN: 4.6 g/dL (ref 3.5–5.2)
ALK PHOS: 65 U/L (ref 39–117)
ALT: 52 U/L (ref 0–53)
AST: 49 U/L — AB (ref 0–37)
BILIRUBIN TOTAL: 1.1 mg/dL (ref 0.2–1.2)
Bilirubin, Direct: 0.2 mg/dL (ref 0.0–0.3)
Total Protein: 6.6 g/dL (ref 6.0–8.3)

## 2015-11-04 LAB — POC URINALSYSI DIPSTICK (AUTOMATED)
BILIRUBIN UA: NEGATIVE
Blood, UA: NEGATIVE
GLUCOSE UA: NEGATIVE
Leukocytes, UA: NEGATIVE
NITRITE UA: NEGATIVE
Protein, UA: NEGATIVE
Spec Grav, UA: 1.025
UROBILINOGEN UA: 0.2
pH, UA: 5.5

## 2015-11-04 LAB — CBC WITH DIFFERENTIAL/PLATELET
BASOS PCT: 0.4 % (ref 0.0–3.0)
Basophils Absolute: 0 10*3/uL (ref 0.0–0.1)
EOS PCT: 4.1 % (ref 0.0–5.0)
Eosinophils Absolute: 0.2 10*3/uL (ref 0.0–0.7)
HCT: 47.2 % (ref 39.0–52.0)
HEMOGLOBIN: 16.3 g/dL (ref 13.0–17.0)
LYMPHS ABS: 1.2 10*3/uL (ref 0.7–4.0)
Lymphocytes Relative: 21 % (ref 12.0–46.0)
MCHC: 34.5 g/dL (ref 30.0–36.0)
MCV: 100.1 fl — ABNORMAL HIGH (ref 78.0–100.0)
MONO ABS: 0.5 10*3/uL (ref 0.1–1.0)
MONOS PCT: 8.9 % (ref 3.0–12.0)
NEUTROS PCT: 65.6 % (ref 43.0–77.0)
Neutro Abs: 3.7 10*3/uL (ref 1.4–7.7)
Platelets: 147 10*3/uL — ABNORMAL LOW (ref 150.0–400.0)
RBC: 4.71 Mil/uL (ref 4.22–5.81)
RDW: 12.4 % (ref 11.5–15.5)
WBC: 5.7 10*3/uL (ref 4.0–10.5)

## 2015-11-04 LAB — HEMOGLOBIN A1C: Hgb A1c MFr Bld: 7.4 % — ABNORMAL HIGH (ref 4.6–6.5)

## 2015-11-04 LAB — TSH: TSH: 1.41 u[IU]/mL (ref 0.35–4.50)

## 2015-11-04 LAB — PSA: PSA: 1.78 ng/mL (ref 0.10–4.00)

## 2015-11-09 ENCOUNTER — Encounter: Payer: Managed Care, Other (non HMO) | Admitting: Family Medicine

## 2015-11-23 ENCOUNTER — Ambulatory Visit (INDEPENDENT_AMBULATORY_CARE_PROVIDER_SITE_OTHER): Payer: Managed Care, Other (non HMO) | Admitting: Family Medicine

## 2015-11-23 ENCOUNTER — Encounter: Payer: Self-pay | Admitting: Family Medicine

## 2015-11-23 VITALS — BP 128/74 | HR 75 | Temp 99.1°F | Ht 73.0 in | Wt 199.0 lb

## 2015-11-23 DIAGNOSIS — J019 Acute sinusitis, unspecified: Secondary | ICD-10-CM | POA: Diagnosis not present

## 2015-11-23 MED ORDER — AZITHROMYCIN 250 MG PO TABS: ORAL_TABLET | ORAL | Status: DC

## 2015-11-23 MED ORDER — HYDROCODONE-HOMATROPINE 5-1.5 MG/5ML PO SYRP: 5.0000 mL | ORAL_SOLUTION | ORAL | Status: DC | PRN

## 2015-11-23 NOTE — Progress Notes (Signed)
   Subjective:    Patient ID: [name] [name]., male    DOB: [birth year], 70 y.o.   MRN: [id number]  HPI Here for 10 days of stuffy head, PND, and a dry cough. No fever. On Mucinex.    Review of Systems  Constitutional: Negative.   HENT: Positive for congestion, postnasal drip and sinus pressure. Negative for sore throat.   Eyes: Negative.   Respiratory: Positive for cough.        Objective:   Physical Exam  Constitutional: He appears well-developed and well-nourished.  HENT:  Right Ear: External ear normal.  Left Ear: External ear normal.  Nose: Nose normal.  Mouth/Throat: Oropharynx is clear and moist.  Eyes: Conjunctivae are normal.  Pulmonary/Chest: Effort normal and breath sounds normal.  Lymphadenopathy:    He has no cervical adenopathy.          Assessment & Plan:  Sinusitis, treat with a Zpack.

## 2015-11-23 NOTE — Progress Notes (Signed)
Pre visit review using our clinic review tool, if applicable. No additional management support is needed unless otherwise documented below in the visit note. 

## 2015-11-24 ENCOUNTER — Ambulatory Visit (INDEPENDENT_AMBULATORY_CARE_PROVIDER_SITE_OTHER): Payer: Managed Care, Other (non HMO) | Admitting: Family Medicine

## 2015-11-24 ENCOUNTER — Encounter: Payer: Self-pay | Admitting: Family Medicine

## 2015-11-24 VITALS — BP 129/73 | HR 73 | Temp 98.8°F | Ht 73.0 in | Wt 199.0 lb

## 2015-11-24 DIAGNOSIS — Z0000 Encounter for general adult medical examination without abnormal findings: Secondary | ICD-10-CM

## 2015-11-24 NOTE — Progress Notes (Signed)
Pre visit review using our clinic review tool, if applicable. No additional management support is needed unless otherwise documented below in the visit note. 

## 2015-11-24 NOTE — Progress Notes (Signed)
   Subjective:    Patient ID: [name] [name]., male    DOB: [birth year], 70 y.o.   MRN: [id number]  HPI 70 yr old male for a cpx. He is doing well except for a current URI. We saw him yesterday and started him on antibiotics for that.    Review of Systems  Constitutional: Negative.   HENT: Negative.   Eyes: Negative.   Respiratory: Negative.   Cardiovascular: Negative.   Gastrointestinal: Negative.   Genitourinary: Negative.   Musculoskeletal: Negative.   Skin: Negative.   Neurological: Negative.   Psychiatric/Behavioral: Negative.        Objective:   Physical Exam  Constitutional: He is oriented to person, place, and time. He appears well-developed and well-nourished. No distress.  HENT:  Head: Normocephalic and atraumatic.  Right Ear: External ear normal.  Left Ear: External ear normal.  Nose: Nose normal.  Mouth/Throat: Oropharynx is clear and moist. No oropharyngeal exudate.  Eyes: Conjunctivae and EOM are normal. Pupils are equal, round, and reactive to light. Right eye exhibits no discharge. Left eye exhibits no discharge. No scleral icterus.  Neck: Neck supple. No JVD present. No tracheal deviation present. No thyromegaly present.  Cardiovascular: Normal rate, regular rhythm, normal heart sounds and intact distal pulses.  Exam reveals no gallop and no friction rub.   No murmur heard. EKG normal   Pulmonary/Chest: Effort normal and breath sounds normal. No respiratory distress. He has no wheezes. He has no rales. He exhibits no tenderness.  Abdominal: Soft. Bowel sounds are normal. He exhibits no distension and no mass. There is no tenderness. There is no rebound and no guarding.  Genitourinary: Rectum normal, prostate normal and penis normal. Guaiac negative stool. No penile tenderness.  Musculoskeletal: Normal range of motion. He exhibits no edema or tenderness.  Lymphadenopathy:    He has no cervical adenopathy.  Neurological: He is alert and oriented to  person, place, and time. He has normal reflexes. No cranial nerve deficit. He exhibits normal muscle tone. Coordination normal.  Skin: Skin is warm and dry. No rash noted. He is not diaphoretic. No erythema. No pallor.  Psychiatric: He has a normal mood and affect. His behavior is normal. Judgment and thought content normal.          Assessment & Plan:  Well exam. We discussed diet and exercise.

## 2015-11-26 ENCOUNTER — Other Ambulatory Visit: Payer: Self-pay | Admitting: Family Medicine

## 2015-11-27 ENCOUNTER — Encounter: Payer: Self-pay | Admitting: Family Medicine

## 2015-11-27 ENCOUNTER — Ambulatory Visit (INDEPENDENT_AMBULATORY_CARE_PROVIDER_SITE_OTHER): Payer: Managed Care, Other (non HMO) | Admitting: Family Medicine

## 2015-11-27 VITALS — BP 123/69 | HR 74 | Temp 98.5°F | Ht 73.0 in | Wt 199.0 lb

## 2015-11-27 DIAGNOSIS — H6123 Impacted cerumen, bilateral: Secondary | ICD-10-CM

## 2015-11-27 DIAGNOSIS — H60392 Other infective otitis externa, left ear: Secondary | ICD-10-CM | POA: Diagnosis not present

## 2015-11-27 MED ORDER — NEOMYCIN-POLYMYXIN-HC 1 % OT SOLN: 4.0000 [drp] | Freq: Four times a day (QID) | OTIC | Status: DC

## 2015-11-27 NOTE — Progress Notes (Signed)
   Subjective:    Patient ID: [name] [name]., male    DOB: 08-14-1946, 70 y.o.   MRN: [id number]  HPI Here for a week of decreased hearing in the left ear and now 2 days of pain in this ear. The right side feels fine. He has been treated for a sinusitis and this has improved quite a bit.    Review of Systems  Constitutional: Negative.   HENT: Positive for ear pain. Negative for congestion, postnasal drip, sinus pressure and sore throat.   Eyes: Negative.   Respiratory: Negative.        Objective:   Physical Exam  Constitutional: He appears well-developed and well-nourished.  HENT:  Nose: Nose normal.  Mouth/Throat: Oropharynx is clear and moist.  Both ear canals are full of cerumen  Eyes: Conjunctivae are normal.  Neck: No thyromegaly present.  Lymphadenopathy:    He has no cervical adenopathy.          Assessment & Plan:  Both ear canals were irrigated clear with water. On reinspection the left canal was swollen and red consistent with otitis externa. This will be treated with Cortisporin Otic drops.

## 2015-11-27 NOTE — Progress Notes (Signed)
Pre visit review using our clinic review tool, if applicable. No additional management support is needed unless otherwise documented below in the visit note. 

## 2015-11-30 ENCOUNTER — Encounter: Payer: Self-pay | Admitting: Family Medicine

## 2015-12-01 NOTE — Telephone Encounter (Signed)
I would give it a few more days to dry up

## 2015-12-03 ENCOUNTER — Encounter: Payer: Self-pay | Admitting: Family Medicine

## 2015-12-03 DIAGNOSIS — H9201 Otalgia, right ear: Secondary | ICD-10-CM

## 2015-12-04 NOTE — Telephone Encounter (Signed)
I suggest he see ENT for this. I will put in a referral to [facility]

## 2015-12-07 ENCOUNTER — Encounter: Payer: Self-pay | Admitting: Family Medicine

## 2015-12-07 NOTE — Telephone Encounter (Signed)
Can you check on referral and let pt know?

## 2016-02-01 ENCOUNTER — Other Ambulatory Visit: Payer: Self-pay | Admitting: Family Medicine

## 2016-04-12 ENCOUNTER — Other Ambulatory Visit: Payer: Self-pay | Admitting: Family Medicine

## 2016-04-13 NOTE — Telephone Encounter (Signed)
Call in #[phone] with one rf 

## 2016-05-11 ENCOUNTER — Telehealth: Payer: Self-pay | Admitting: Family Medicine

## 2016-05-11 MED ORDER — TAMSULOSIN HCL 0.4 MG PO CAPS: ORAL_CAPSULE | ORAL | 6 refills | Status: DC

## 2016-05-11 NOTE — Telephone Encounter (Signed)
Pharmacy called for pt to request a refill of tamsulosin (FLOMAX) 0.4 MG CAPS capsule 60 tabs  30 day with refill

## 2016-05-11 NOTE — Telephone Encounter (Signed)
I sent script e-scribe to [facility]. 

## 2016-05-18 ENCOUNTER — Other Ambulatory Visit: Payer: Self-pay | Admitting: Family Medicine

## 2016-06-16 ENCOUNTER — Encounter: Payer: Self-pay | Admitting: Family Medicine

## 2016-06-16 NOTE — Telephone Encounter (Signed)
Please call pt and schedule Nurse visit with Sunday Spillers for Flu shot and other vaccines he needs. Thanks

## 2016-06-16 NOTE — Telephone Encounter (Signed)
lmom for pt to call and schedule appointment

## 2016-06-17 NOTE — Telephone Encounter (Signed)
lmom for pt to give our office a call back.

## 2016-07-06 NOTE — Telephone Encounter (Signed)
Pt state he did not have time to talk will call back when he is available.

## 2016-07-18 ENCOUNTER — Other Ambulatory Visit: Payer: Self-pay | Admitting: Family Medicine

## 2016-07-20 ENCOUNTER — Telehealth: Payer: Self-pay | Admitting: Family Medicine

## 2016-07-20 NOTE — Telephone Encounter (Signed)
Pt needs to schedule a office visit, he recently had a physical however still needs a visit to discuss these issues.

## 2016-07-20 NOTE — Telephone Encounter (Signed)
Pt would like to have the following done and I would like to have orders if orders are needed Hep C,foot exam, ophthalmology, pneumonia injection, flu shot and check A1C

## 2016-07-20 NOTE — Telephone Encounter (Signed)
Pt has been contacted by email to contact the office to schedule an appointment

## 2016-07-21 NOTE — Telephone Encounter (Signed)
Pt has been scheduled.  °

## 2016-07-26 ENCOUNTER — Ambulatory Visit: Payer: Managed Care, Other (non HMO) | Admitting: Family Medicine

## 2016-08-01 ENCOUNTER — Encounter: Payer: Self-pay | Admitting: Family Medicine

## 2016-08-01 ENCOUNTER — Ambulatory Visit (INDEPENDENT_AMBULATORY_CARE_PROVIDER_SITE_OTHER): Payer: Managed Care, Other (non HMO) | Admitting: Family Medicine

## 2016-08-01 VITALS — BP 138/77 | HR 71 | Temp 97.5°F | Ht 73.0 in | Wt 197.0 lb

## 2016-08-01 DIAGNOSIS — Z209 Contact with and (suspected) exposure to unspecified communicable disease: Secondary | ICD-10-CM | POA: Diagnosis not present

## 2016-08-01 DIAGNOSIS — Z23 Encounter for immunization: Secondary | ICD-10-CM

## 2016-08-01 DIAGNOSIS — E1165 Type 2 diabetes mellitus with hyperglycemia: Secondary | ICD-10-CM

## 2016-08-01 DIAGNOSIS — IMO0001 Reserved for inherently not codable concepts without codable children: Secondary | ICD-10-CM

## 2016-08-01 DIAGNOSIS — S90415A Abrasion, left lesser toe(s), initial encounter: Secondary | ICD-10-CM | POA: Diagnosis not present

## 2016-08-01 LAB — HEMOGLOBIN A1C: HEMOGLOBIN A1C: 7.6 % — AB (ref 4.6–6.5)

## 2016-08-01 NOTE — Progress Notes (Signed)
   Subjective:    Patient ID: [name] [name]., male    DOB: 1946/05/07, 71 y.o.   MRN: [id number]  HPI Here to follow up on diabetes and to check his toe. 2 days ago he stubbed his toe against the edge of a brick sidewalk. It is mildly tender. He is applying antibiotic ointment. His am fasting glucoses have been in the 170-180 range, and he admits to slipping on his diet lately.    Review of Systems  Constitutional: Negative.   Respiratory: Negative.   Cardiovascular: Negative.   Skin: Positive for wound.  Neurological: Negative.        Objective:   Physical Exam  Constitutional: He is oriented to person, place, and time. He appears well-developed and well-nourished.  Cardiovascular: Normal rate, regular rhythm, normal heart sounds and intact distal pulses.   Pulmonary/Chest: Effort normal and breath sounds normal.  Neurological: He is alert and oriented to person, place, and time.  Skin:  The left great toe has an abrasion on the tip. It looks clean.          Assessment & Plan:  He has a toe abrasion, and he will use the ointment prn. His diabetes is not well controlled and he agreed to tighten up on his diet. Get an A1c today.  Laurey Morale, MD

## 2016-08-01 NOTE — Addendum Note (Signed)
Addended by: Aggie Hacker A on: 08/01/2016 09:22 AM   Modules accepted: Orders

## 2016-08-01 NOTE — Addendum Note (Signed)
Addended by: Aggie Hacker A on: 08/01/2016 09:18 AM   Modules accepted: Orders

## 2016-08-01 NOTE — Progress Notes (Signed)
Pre visit review using our clinic review tool, if applicable. No additional management support is needed unless otherwise documented below in the visit note. 

## 2016-08-02 LAB — HEPATITIS C ANTIBODY: HCV Ab: NEGATIVE

## 2016-08-21 ENCOUNTER — Encounter: Payer: Self-pay | Admitting: Family Medicine

## 2016-08-23 DIAGNOSIS — Z961 Presence of intraocular lens: Secondary | ICD-10-CM | POA: Insufficient documentation

## 2016-08-23 DIAGNOSIS — H3581 Retinal edema: Secondary | ICD-10-CM | POA: Insufficient documentation

## 2016-08-23 DIAGNOSIS — H2511 Age-related nuclear cataract, right eye: Secondary | ICD-10-CM | POA: Insufficient documentation

## 2016-08-23 DIAGNOSIS — H30033 Focal chorioretinal inflammation, peripheral, bilateral: Secondary | ICD-10-CM | POA: Insufficient documentation

## 2016-09-11 ENCOUNTER — Encounter: Payer: Self-pay | Admitting: Family Medicine

## 2016-09-13 ENCOUNTER — Other Ambulatory Visit: Payer: Self-pay | Admitting: Family Medicine

## 2016-09-14 ENCOUNTER — Other Ambulatory Visit: Payer: Self-pay | Admitting: Family Medicine

## 2016-09-14 MED ORDER — LISINOPRIL 20 MG PO TABS: 10.0000 mg | ORAL_TABLET | Freq: Every day | ORAL | 0 refills | Status: DC

## 2016-09-14 NOTE — Telephone Encounter (Signed)
I spoke with pt and sent script e-scribe for Lisinopril to [facility].

## 2016-09-14 NOTE — Telephone Encounter (Signed)
I spoke with pt and sent script e-scribe for Lisinopril to [facility] for a 90 day supply.

## 2016-10-13 ENCOUNTER — Encounter: Payer: Self-pay | Admitting: Family Medicine

## 2016-10-14 NOTE — Telephone Encounter (Signed)
Dr. Fry - Please advise. Thanks! 

## 2016-10-14 NOTE — Telephone Encounter (Signed)
I would not say this is normal. It could be from medications. I know he takes Flomax twice a day and this can drop the BP. What time of day does he take the Lisinopril?

## 2016-10-16 ENCOUNTER — Other Ambulatory Visit: Payer: Self-pay | Admitting: Family Medicine

## 2016-10-17 ENCOUNTER — Other Ambulatory Visit: Payer: Self-pay | Admitting: Family Medicine

## 2016-10-18 ENCOUNTER — Encounter: Payer: Self-pay | Admitting: Family Medicine

## 2016-10-18 NOTE — Telephone Encounter (Signed)
Call in #[phone] with one rf 

## 2016-10-20 ENCOUNTER — Other Ambulatory Visit: Payer: Self-pay | Admitting: Family Medicine

## 2016-10-20 MED ORDER — ZOLPIDEM TARTRATE 10 MG PO TABS: ORAL_TABLET | ORAL | 0 refills | Status: DC

## 2016-10-20 NOTE — Telephone Encounter (Signed)
I spoke with pharmacy and gave new information for script.

## 2016-10-20 NOTE — Telephone Encounter (Signed)
Resend script for Ambien # [id number]

## 2016-10-21 NOTE — Telephone Encounter (Signed)
Then let's just keep things the way they are. Recheck prn

## 2016-10-31 ENCOUNTER — Encounter: Payer: Self-pay | Admitting: Family Medicine

## 2016-11-01 ENCOUNTER — Other Ambulatory Visit (INDEPENDENT_AMBULATORY_CARE_PROVIDER_SITE_OTHER): Payer: Managed Care, Other (non HMO)

## 2016-11-01 DIAGNOSIS — E118 Type 2 diabetes mellitus with unspecified complications: Secondary | ICD-10-CM | POA: Diagnosis not present

## 2016-11-01 DIAGNOSIS — E1165 Type 2 diabetes mellitus with hyperglycemia: Secondary | ICD-10-CM | POA: Diagnosis not present

## 2016-11-01 LAB — HEMOGLOBIN A1C: HEMOGLOBIN A1C: 7.1 % — AB (ref 4.6–6.5)

## 2016-11-02 ENCOUNTER — Encounter: Payer: Self-pay | Admitting: Family Medicine

## 2016-11-02 DIAGNOSIS — I10 Essential (primary) hypertension: Secondary | ICD-10-CM

## 2016-11-02 DIAGNOSIS — N401 Enlarged prostate with lower urinary tract symptoms: Secondary | ICD-10-CM

## 2016-11-02 DIAGNOSIS — N138 Other obstructive and reflux uropathy: Secondary | ICD-10-CM

## 2016-11-04 NOTE — Telephone Encounter (Signed)
noted 

## 2016-11-13 ENCOUNTER — Other Ambulatory Visit: Payer: Self-pay | Admitting: Family Medicine

## 2016-11-15 ENCOUNTER — Other Ambulatory Visit: Payer: Managed Care, Other (non HMO)

## 2016-11-23 ENCOUNTER — Encounter: Payer: Managed Care, Other (non HMO) | Admitting: Family Medicine

## 2016-11-25 NOTE — Telephone Encounter (Signed)
Labs were ordered

## 2016-11-28 ENCOUNTER — Other Ambulatory Visit (INDEPENDENT_AMBULATORY_CARE_PROVIDER_SITE_OTHER): Payer: 59

## 2016-11-28 DIAGNOSIS — N401 Enlarged prostate with lower urinary tract symptoms: Secondary | ICD-10-CM | POA: Diagnosis not present

## 2016-11-28 DIAGNOSIS — I10 Essential (primary) hypertension: Secondary | ICD-10-CM

## 2016-11-28 DIAGNOSIS — N138 Other obstructive and reflux uropathy: Secondary | ICD-10-CM

## 2016-11-28 LAB — CBC WITH DIFFERENTIAL/PLATELET
BASOS ABS: 0 10*3/uL (ref 0.0–0.1)
Basophils Relative: 0.6 % (ref 0.0–3.0)
EOS PCT: 3.3 % (ref 0.0–5.0)
Eosinophils Absolute: 0.2 10*3/uL (ref 0.0–0.7)
HCT: 43.6 % (ref 39.0–52.0)
Hemoglobin: 15.2 g/dL (ref 13.0–17.0)
LYMPHS ABS: 1 10*3/uL (ref 0.7–4.0)
LYMPHS PCT: 20.1 % (ref 12.0–46.0)
MCHC: 34.8 g/dL (ref 30.0–36.0)
MCV: 98.6 fl (ref 78.0–100.0)
MONOS PCT: 8.9 % (ref 3.0–12.0)
Monocytes Absolute: 0.5 10*3/uL (ref 0.1–1.0)
NEUTROS PCT: 67.1 % (ref 43.0–77.0)
Neutro Abs: 3.5 10*3/uL (ref 1.4–7.7)
Platelets: 149 10*3/uL — ABNORMAL LOW (ref 150.0–400.0)
RBC: 4.42 Mil/uL (ref 4.22–5.81)
RDW: 12.9 % (ref 11.5–15.5)
WBC: 5.2 10*3/uL (ref 4.0–10.5)

## 2016-11-28 LAB — POC URINALSYSI DIPSTICK (AUTOMATED)
Bilirubin, UA: NEGATIVE
Blood, UA: NEGATIVE
Glucose, UA: NEGATIVE
KETONES UA: NEGATIVE
LEUKOCYTES UA: NEGATIVE
NITRITE UA: NEGATIVE
PH UA: 6 (ref 5.0–8.0)
PROTEIN UA: NEGATIVE
Spec Grav, UA: 1.015 (ref 1.030–1.035)
Urobilinogen, UA: 0.2 (ref ?–2.0)

## 2016-11-28 LAB — BASIC METABOLIC PANEL
BUN: 26 mg/dL — ABNORMAL HIGH (ref 6–23)
CALCIUM: 9.5 mg/dL (ref 8.4–10.5)
CHLORIDE: 101 meq/L (ref 96–112)
CO2: 28 meq/L (ref 19–32)
Creatinine, Ser: 1.04 mg/dL (ref 0.40–1.50)
GFR: 74.73 mL/min (ref >60.00)
Glucose, Bld: 170 mg/dL — ABNORMAL HIGH (ref 70–99)
Potassium: 4.3 mEq/L (ref 3.5–5.1)
SODIUM: 136 meq/L (ref 135–145)

## 2016-11-28 LAB — LIPID PANEL
Cholesterol: 123 mg/dL (ref 0–200)
HDL: 38.3 mg/dL — ABNORMAL LOW (ref >39.00)
LDL Cholesterol: 66 mg/dL (ref 0–99)
NONHDL: 84.71
Total CHOL/HDL Ratio: 3
Triglycerides: 95 mg/dL (ref 0.0–149.0)
VLDL: 19 mg/dL (ref 0.0–40.0)

## 2016-11-28 LAB — PSA: PSA: 2.41 ng/mL (ref 0.10–4.00)

## 2016-11-28 LAB — TSH: TSH: 1.24 u[IU]/mL (ref 0.35–4.50)

## 2016-11-28 LAB — HEPATIC FUNCTION PANEL
ALT: 38 U/L (ref 0–53)
AST: 29 U/L (ref 0–37)
Albumin: 4.5 g/dL (ref 3.5–5.2)
Alkaline Phosphatase: 60 U/L (ref 39–117)
BILIRUBIN TOTAL: 0.8 mg/dL (ref 0.2–1.2)
Bilirubin, Direct: 0.2 mg/dL (ref 0.0–0.3)
Total Protein: 6.5 g/dL (ref 6.0–8.3)

## 2016-12-06 ENCOUNTER — Encounter: Payer: Self-pay | Admitting: Family Medicine

## 2016-12-06 ENCOUNTER — Ambulatory Visit (INDEPENDENT_AMBULATORY_CARE_PROVIDER_SITE_OTHER): Payer: 59 | Admitting: Family Medicine

## 2016-12-06 VITALS — BP 129/67 | HR 82 | Temp 98.5°F | Ht 73.0 in | Wt 184.0 lb

## 2016-12-06 DIAGNOSIS — Z0000 Encounter for general adult medical examination without abnormal findings: Secondary | ICD-10-CM

## 2016-12-06 MED ORDER — TAMSULOSIN HCL 0.4 MG PO CAPS: 0.4000 mg | ORAL_CAPSULE | Freq: Every day | ORAL | 6 refills | Status: DC

## 2016-12-06 NOTE — Progress Notes (Signed)
Pre visit review using our clinic review tool, if applicable. No additional management support is needed unless otherwise documented below in the visit note. 

## 2016-12-06 NOTE — Patient Instructions (Signed)
WE NOW OFFER   [facility] FAST TRACK!!!  SAME DAY Appointments for ACUTE CARE  Such as: Sprains, Injuries, cuts, abrasions, rashes, muscle pain, joint pain, back pain Colds, flu, sore throats, headache, allergies, cough, fever  Ear pain, sinus and eye infections Abdominal pain, nausea, vomiting, diarrhea, upset stomach Animal/insect bites  3 Easy Ways to Schedule: Walk-In Scheduling Call in scheduling Mychart Sign-up: [email or web address]         

## 2016-12-06 NOTE — Progress Notes (Signed)
   Subjective:    Patient ID: [name] [name]., male    DOB: Mar 26, 1946, 71 y.o.   MRN: [id number]  HPI 71 yr old male for a well exam. He feels good but does mention occasional lightheadedness, especially when he stands up out of a chair.    Review of Systems  Constitutional: Negative.   HENT: Negative.   Eyes: Negative.   Respiratory: Negative.   Cardiovascular: Negative.   Gastrointestinal: Negative.   Genitourinary: Negative.   Musculoskeletal: Negative.   Skin: Negative.   Neurological: Positive for light-headedness. Negative for dizziness, tremors, seizures, syncope, facial asymmetry, speech difficulty, weakness, numbness and headaches.  Psychiatric/Behavioral: Negative.        Objective:   Physical Exam  Constitutional: He is oriented to person, place, and time. He appears well-developed and well-nourished. No distress.  HENT:  Head: Normocephalic and atraumatic.  Right Ear: External ear normal.  Left Ear: External ear normal.  Nose: Nose normal.  Mouth/Throat: Oropharynx is clear and moist. No oropharyngeal exudate.  Eyes: Conjunctivae and EOM are normal. Pupils are equal, round, and reactive to light. Right eye exhibits no discharge. Left eye exhibits no discharge. No scleral icterus.  Neck: Neck supple. No JVD present. No tracheal deviation present. No thyromegaly present.  Cardiovascular: Normal rate, regular rhythm, normal heart sounds and intact distal pulses.  Exam reveals no gallop and no friction rub.   No murmur heard. Pulmonary/Chest: Effort normal and breath sounds normal. No respiratory distress. He has no wheezes. He has no rales. He exhibits no tenderness.  Abdominal: Soft. Bowel sounds are normal. He exhibits no distension and no mass. There is no tenderness. There is no rebound and no guarding.  Genitourinary: Rectum normal, prostate normal and penis normal. Rectal exam shows guaiac negative stool. No penile tenderness.  Musculoskeletal: Normal  range of motion. He exhibits no edema or tenderness.  Lymphadenopathy:    He has no cervical adenopathy.  Neurological: He is alert and oriented to person, place, and time. He has normal reflexes. No cranial nerve deficit. He exhibits normal muscle tone. Coordination normal.  Skin: Skin is warm and dry. No rash noted. He is not diaphoretic. No erythema. No pallor.  Psychiatric: He has a normal mood and affect. His behavior is normal. Judgment and thought content normal.          Assessment & Plan:  Well exam. We discussed diet and exercise. He seems to have some orthostasis, so he will decrease the Flomax to once a day dosing.  Alysia Penna, MD

## 2016-12-11 ENCOUNTER — Other Ambulatory Visit: Payer: Self-pay | Admitting: Family Medicine

## 2016-12-15 ENCOUNTER — Other Ambulatory Visit: Payer: Self-pay | Admitting: Family Medicine

## 2016-12-27 ENCOUNTER — Encounter: Payer: Self-pay | Admitting: Family Medicine

## 2016-12-27 ENCOUNTER — Encounter: Payer: Self-pay | Admitting: Gastroenterology

## 2017-02-10 ENCOUNTER — Encounter: Payer: Self-pay | Admitting: Gastroenterology

## 2017-02-20 ENCOUNTER — Other Ambulatory Visit: Payer: Self-pay | Admitting: Family Medicine

## 2017-03-16 HISTORY — PX: CATARACT EXTRACTION: SUR2

## 2017-03-28 ENCOUNTER — Ambulatory Visit (AMBULATORY_SURGERY_CENTER): Payer: Self-pay

## 2017-03-28 VITALS — Ht 74.0 in | Wt 185.4 lb

## 2017-03-28 DIAGNOSIS — Z800 Family history of malignant neoplasm of digestive organs: Secondary | ICD-10-CM

## 2017-03-28 DIAGNOSIS — Z86010 Personal history of colonic polyps: Secondary | ICD-10-CM

## 2017-03-28 MED ORDER — NA SULFATE-K SULFATE-MG SULF 17.5-3.13-1.6 GM/177ML PO SOLN: ORAL | 0 refills | Status: DC

## 2017-03-28 NOTE — Progress Notes (Signed)
Per pt, no allergies to soy or egg products.Pt not taking any weight loss meds or using  O2 at home.   Pt refused Emmi video. 

## 2017-04-06 ENCOUNTER — Encounter: Payer: Self-pay | Admitting: Gastroenterology

## 2017-04-06 ENCOUNTER — Other Ambulatory Visit: Payer: Self-pay | Admitting: Family Medicine

## 2017-04-11 ENCOUNTER — Other Ambulatory Visit: Payer: Self-pay | Admitting: Family Medicine

## 2017-04-13 NOTE — Telephone Encounter (Signed)
Call in #[phone] with one rf 

## 2017-04-14 ENCOUNTER — Encounter: Payer: 59 | Admitting: Gastroenterology

## 2017-04-17 ENCOUNTER — Telehealth: Payer: Self-pay | Admitting: Family Medicine

## 2017-04-17 ENCOUNTER — Telehealth: Payer: Self-pay

## 2017-04-17 ENCOUNTER — Encounter: Payer: Self-pay | Admitting: Family Medicine

## 2017-04-17 ENCOUNTER — Ambulatory Visit (INDEPENDENT_AMBULATORY_CARE_PROVIDER_SITE_OTHER): Payer: 59 | Admitting: Family Medicine

## 2017-04-17 VITALS — BP 124/79 | HR 74 | Temp 98.7°F | Ht 74.0 in | Wt 183.0 lb

## 2017-04-17 DIAGNOSIS — R000 Tachycardia, unspecified: Secondary | ICD-10-CM

## 2017-04-17 DIAGNOSIS — R42 Dizziness and giddiness: Secondary | ICD-10-CM | POA: Diagnosis not present

## 2017-04-17 DIAGNOSIS — I10 Essential (primary) hypertension: Secondary | ICD-10-CM | POA: Diagnosis not present

## 2017-04-17 NOTE — Patient Instructions (Signed)
WE NOW OFFER   [facility] FAST TRACK!!!  SAME DAY Appointments for ACUTE CARE  Such as: Sprains, Injuries, cuts, abrasions, rashes, muscle pain, joint pain, back pain Colds, flu, sore throats, headache, allergies, cough, fever  Ear pain, sinus and eye infections Abdominal pain, nausea, vomiting, diarrhea, upset stomach Animal/insect bites  3 Easy Ways to Schedule: Walk-In Scheduling Call in scheduling Mychart Sign-up: [email or web address]         

## 2017-04-17 NOTE — Telephone Encounter (Signed)
Can you please put pt on schedule for 3:30 pm for lightheadedness? I had put on hole earlier, pt is aware of appointment, only need 15 minute space.

## 2017-04-17 NOTE — Progress Notes (Signed)
   Subjective:    Patient ID: [name] [name]., male    DOB: [birth year], 71 y.o.   MRN: [id number]  HPI Here to discuss some intermittent symptoms of lightheadedness over the past few weeks. He often notices this more when he stands up from sitting or when he stands up after placing his golf ball on the tee. He denies dizziness or a sense that things are spinning around him. No headaches or nausea. No chest pain or SOB or palpitations. He says he drinks plenty of fluids every day, especially if he is outside working in the yard or playing golf. No passing out. He checks his BP frequently at home and this has been steady in the 120s over 70s. His heart rate is usually in the 70s or 80s, but he notes that during one of these spells it can get as high as 127.    Review of Systems  Constitutional: Negative.   Respiratory: Negative.   Cardiovascular: Negative.   Gastrointestinal: Negative.   Neurological: Positive for light-headedness. Negative for dizziness, tremors, seizures, syncope, facial asymmetry, speech difficulty, weakness, numbness and headaches.       Objective:   Physical Exam  Constitutional: He is oriented to person, place, and time. He appears well-developed and well-nourished. No distress.  Neck: No thyromegaly present.  Cardiovascular: Normal rate, regular rhythm, normal heart sounds and intact distal pulses.   Pulmonary/Chest: Effort normal and breath sounds normal. No respiratory distress. He has no wheezes. He has no rales.  Musculoskeletal: He exhibits no edema.  Lymphadenopathy:    He has no cervical adenopathy.  Neurological: He is alert and oriented to person, place, and time.          Assessment & Plan:  He has spells of lightheadedness that sound like orthostasis. It is not clear if they may be rate related or not. We will set up carotid dopplers soon and have him see Cardiology for this.  Alysia Penna, MD

## 2017-04-17 NOTE — Telephone Encounter (Signed)
Per Dr. Sarajane Jews pt needs office visit today. Pt has been scheduled for 3:30 pm today here.

## 2017-04-17 NOTE — Telephone Encounter (Signed)
Pt has been scheduled.  °

## 2017-04-17 NOTE — Telephone Encounter (Signed)
Pt scheduled to see Dr. Sarajane Jews this afternoon. Nothing further needed at this time.

## 2017-04-17 NOTE — Telephone Encounter (Signed)
Per Dr. Sarajane Jews he would like to see pt, see MyChart email dated 04/17/17.  LMTCB to schedule.

## 2017-04-20 ENCOUNTER — Encounter: Payer: Self-pay | Admitting: Gastroenterology

## 2017-04-20 ENCOUNTER — Ambulatory Visit (AMBULATORY_SURGERY_CENTER): Payer: 59 | Admitting: Gastroenterology

## 2017-04-20 VITALS — BP 110/68 | HR 66 | Temp 98.2°F | Resp 17 | Ht 74.0 in | Wt 183.0 lb

## 2017-04-20 DIAGNOSIS — Z1212 Encounter for screening for malignant neoplasm of rectum: Secondary | ICD-10-CM

## 2017-04-20 DIAGNOSIS — Z800 Family history of malignant neoplasm of digestive organs: Secondary | ICD-10-CM | POA: Diagnosis not present

## 2017-04-20 DIAGNOSIS — D120 Benign neoplasm of cecum: Secondary | ICD-10-CM

## 2017-04-20 DIAGNOSIS — Z1211 Encounter for screening for malignant neoplasm of colon: Secondary | ICD-10-CM

## 2017-04-20 HISTORY — PX: COLONOSCOPY: SHX174

## 2017-04-20 MED ORDER — SODIUM CHLORIDE 0.9 % IV SOLN: 500.0000 mL | INTRAVENOUS | Status: DC

## 2017-04-20 NOTE — Progress Notes (Signed)
Report to PACU, RN, vss, BBS= Clear.  

## 2017-04-20 NOTE — Progress Notes (Signed)
Called to room to assist during endoscopic procedure.  Patient ID and intended procedure confirmed with present staff. Received instructions for my participation in the procedure from the performing physician.  

## 2017-04-20 NOTE — Patient Instructions (Signed)
YOU HAD AN ENDOSCOPIC PROCEDURE TODAY AT THE [facility]:   Refer to the procedure report that was given to you for any specific questions about what was found during the examination.  If the procedure report does not answer your questions, please call your gastroenterologist to clarify.  If you requested that your care partner not be given the details of your procedure findings, then the procedure report has been included in a sealed envelope for you to review at your convenience later.  YOU SHOULD EXPECT: Some feelings of bloating in the abdomen. Passage of more gas than usual.  Walking can help get rid of the air that was put into your GI tract during the procedure and reduce the bloating. If you had a lower endoscopy (such as a colonoscopy or flexible sigmoidoscopy) you may notice spotting of blood in your stool or on the toilet paper. If you underwent a bowel prep for your procedure, you may not have a normal bowel movement for a few days.  Please Note:  You might notice some irritation and congestion in your nose or some drainage.  This is from the oxygen used during your procedure.  There is no need for concern and it should clear up in a day or so.  SYMPTOMS TO REPORT IMMEDIATELY:   Following lower endoscopy (colonoscopy or flexible sigmoidoscopy):  Excessive amounts of blood in the stool  Significant tenderness or worsening of abdominal pains  Swelling of the abdomen that is new, acute  Fever of 100F or higher   For urgent or emergent issues, a gastroenterologist can be reached at any hour by calling [phone].   DIET:  We do recommend a small meal at first, but then you may proceed to your regular diet.  Drink plenty of fluids but you should avoid alcoholic beverages for 24 hours.  ACTIVITY:  You should plan to take it easy for the rest of today and you should NOT DRIVE or use heavy machinery until tomorrow (because of the sedation medicines used during the test).     FOLLOW UP: Our staff will call the number listed on your records the next business day following your procedure to check on you and address any questions or concerns that you may have regarding the information given to you following your procedure. If we do not reach you, we will leave a message.  However, if you are feeling well and you are not experiencing any problems, there is no need to return our call.  We will assume that you have returned to your regular daily activities without incident.  If any biopsies were taken you will be contacted by phone or by letter within the next 1-3 weeks.  Please call us at [phone] if you have not heard about the biopsies in 3 weeks.    SIGNATURES/CONFIDENTIALITY: You and/or your care partner have signed paperwork which will be entered into your electronic medical record.  These signatures attest to the fact that that the information above on your After Visit Summary has been reviewed and is understood.  Full responsibility of the confidentiality of this discharge information lies with you and/or your care-partner.  Read all of the handouts given to you by your recovery room nurse. 

## 2017-04-20 NOTE — Op Note (Signed)
[facility] Patient Name: [name] [name] Procedure Date: 04/20/2017 2:24 PM MRN: [id number] Endoscopist: Mallie Mussel L. Loletha Carrow , MD Age: 71 Referring MD:  Date of Birth: 28-Nov-1945 Gender: Male Account #: [id number] Procedure:                Colonoscopy Indications:              Colon cancer screening in patient at increased                            risk: Colorectal cancer in father Medicines:                Monitored Anesthesia Care Procedure:                Pre-Anesthesia Assessment:                           - Prior to the procedure, a History and Physical                            was performed, and patient medications and                            allergies were reviewed. The patient's tolerance of                            previous anesthesia was also reviewed. The risks                            and benefits of the procedure and the sedation                            options and risks were discussed with the patient.                            All questions were answered, and informed consent                            was obtained. Anticoagulants: The patient has taken                            aspirin. It was decided not to withhold this                            medication prior to the procedure. ASA Grade                            Assessment: II - A patient with mild systemic                            disease. After reviewing the risks and benefits,                            the patient was deemed in satisfactory condition to  undergo the procedure.                           After obtaining informed consent, the colonoscope                            was passed under direct vision. Throughout the                            procedure, the patient's blood pressure, pulse, and                            oxygen saturations were monitored continuously. The                            Model CF-HQ190L [id number]) scope was introduced                         through the anus and advanced to the the cecum,                            identified by appendiceal orifice and ileocecal                            valve. The colonoscopy was performed without                            difficulty. The patient tolerated the procedure                            well. The quality of the bowel preparation was                            excellent. The ileocecal valve, appendiceal                            orifice, and rectum were photographed. The quality                            of the bowel preparation was evaluated using the                            BBPS [facility] Bowel Preparation Scale) with scores                            of: Right Colon = 3, Transverse Colon = 3 and Left                            Colon = 3 (entire mucosa seen well with no residual                            staining, small fragments of stool or opaque  liquid). The total BBPS score equals 9. The bowel                            preparation used was SUPREP. Scope In: 2:29:05 PM Scope Out: 2:44:03 PM Scope Withdrawal Time: 0 hours 12 minutes 20 seconds  Total Procedure Duration: 0 hours 14 minutes 58 seconds  Findings:                 The perianal and digital rectal examinations were                            normal.                           A 10 mm polyp was found in the cecum. The polyp was                            sessile. The polyp was removed with a cold snare.                            Resection and retrieval were complete.                           Diverticula were found in the left colon.                           Internal hemorrhoids were found. The hemorrhoids                            were medium-sized and Grade II (internal                            hemorrhoids that prolapse but reduce spontaneously).                           The exam was otherwise without abnormality on                            direct and  retroflexion views. Complications:            No immediate complications. Estimated Blood Loss:     Estimated blood loss was minimal. Impression:               - One 10 mm polyp in the cecum, removed with a cold                            snare. Resected and retrieved.                           - Diverticulosis in the left colon.                           - Internal hemorrhoids.                           - The examination was otherwise normal  on direct                            and retroflexion views. Recommendation:           - Patient has a contact number available for                            emergencies. The signs and symptoms of potential                            delayed complications were discussed with the                            patient. Return to normal activities tomorrow.                            Written discharge instructions were provided to the                            patient.                           - Resume previous diet.                           - Continue present medications.                           - Await pathology results.                           - Repeat colonoscopy is recommended for                            surveillance. The colonoscopy date will be                            determined after pathology results from today's                            exam become available for review.  L. Loletha Carrow, MD 04/20/2017 2:47:50 PM This report has been signed electronically.

## 2017-04-21 ENCOUNTER — Telehealth: Payer: Self-pay

## 2017-04-21 NOTE — Telephone Encounter (Signed)
Attempted to reach patient for post-procedure f/u. No answer. Left message that we will attempt to reach him again later today and for him to please call us if he has any questions/concerns regarding his care.

## 2017-04-21 NOTE — Telephone Encounter (Signed)
Number identifier, 2nd attempt left a voicemail.

## 2017-04-26 ENCOUNTER — Encounter: Payer: Self-pay | Admitting: Gastroenterology

## 2017-05-10 ENCOUNTER — Other Ambulatory Visit: Payer: Self-pay | Admitting: Family Medicine

## 2017-05-17 ENCOUNTER — Encounter: Payer: Self-pay | Admitting: Cardiovascular Disease

## 2017-05-17 ENCOUNTER — Ambulatory Visit (INDEPENDENT_AMBULATORY_CARE_PROVIDER_SITE_OTHER): Payer: 59 | Admitting: Cardiovascular Disease

## 2017-05-17 VITALS — BP 104/70 | HR 69 | Ht 74.0 in | Wt 185.8 lb

## 2017-05-17 DIAGNOSIS — R42 Dizziness and giddiness: Secondary | ICD-10-CM

## 2017-05-17 DIAGNOSIS — I493 Ventricular premature depolarization: Secondary | ICD-10-CM

## 2017-05-17 NOTE — Progress Notes (Signed)
Chief Complaint  Patient presents with  . New Patient (Initial Visit)    dizziness   History of Present Illness: 71 yo male with history of DM, HTN, HLD, PVCs and PACs here today as a new consult, referred by Dr. Sarajane Jews, for evaluation of dizziness.  I saw him in 2013 for dizziness also. He is here today to re-establish care in our office.  Echo in 2014 with normal LV systolic function and no valve disease. Cardiac monitor in 2013 with PVCs and one episode of Mobitz 1 heart block.   He tells me today that he has been having dizziness when he bends over. No chest pain or dyspnea. He had an episode of syncope one year ago while walking to the kitchen. The dizziness only occurs when he bends over and not every time he bends over. He is very active.   Primary Care Physician: Laurey Morale, MD   Past Medical History:  Diagnosis Date  . Cataract    Bil/ right eye surgery  . Diabetes mellitus    type II  . Diverticulitis of colon   . ED (erectile dysfunction)   . Gout    no meds  . H/O echocardiogram 2013   normal   . History of irregular heartbeat    Per pt, heart skips beats at times/had Echo  . Hyperlipidemia   . Hypertension   . Iritis, recurrent     sees Dr. Silvestre Gunner at [facility]   . Normal cardiac stress test 09/27/2002  . Palpitations    PVCs and PACs per event monitor  . Post-operative nausea and vomiting   . Uveitis    sees Dr Polly Cobia at [facility] Dr Manuella Ghazi now    Past Surgical History:  Procedure Laterality Date  . APPENDECTOMY    . CATARACT EXTRACTION     Dr Katy Fitch , left eye/ 2 years ago  . CATARACT EXTRACTION  03/16/2017   Right eye  . COLONOSCOPY  02-27-12   per Dr. Deatra Ina, clear, repeat in 5 yrs   . SHOULDER ARTHROSCOPY     both shoulders   . TONSILLECTOMY    . TONSILLECTOMY AND ADENOIDECTOMY    . tubes in ears     55 years ago    Current Outpatient Prescriptions  Medication Sig Dispense Refill  . Adalimumab (HUMIRA) 40 MG/0.4ML PSKT Inject into the  skin. Humira 40 mg SQ every other week    . aspirin 81 MG tablet Take 81 mg by mouth daily.      . brimonidine-timolol (COMBIGAN) 0.2-0.5 % ophthalmic solution Place 1 drop into both eyes every 12 (twelve) hours. Reported on 11/23/2015    . calcium-vitamin D (OSCAL WITH D) 500-200 MG-UNIT per tablet Take 2 tablets by mouth daily.      Marland Kitchen FOLIC ACID PO Take 10 mg by mouth daily.     Marland Kitchen lisinopril (PRINIVIL,ZESTRIL) 20 MG tablet TAKE 1/2 TABLET( 10 MG TOTAL) BY MOUTH DAILY 90 tablet 2  . metFORMIN (GLUCOPHAGE) 1000 MG tablet TAKE 1 TABLET BY MOUTH TWICE DAILY WITH MEALS 180 tablet 0  . methotrexate 2.5 MG tablet Take 20 mg by mouth once a week.     . Multiple Vitamin (MULTIVITAMIN) tablet Take 1 tablet by mouth daily.    . nabumetone (RELAFEN) 500 MG tablet Take 500 mg by mouth at bedtime as needed.    Glory Rosebush DELICA LANCETS 70Y MISC USE AS DIRECTED. 100 each 3  . ONETOUCH VERIO test strip USE  TO CHECK BLOOD SUGAR TWICE DAILY OR AS DIRECTED 100 each 3  . rosuvastatin (CRESTOR) 20 MG tablet TAKE 1 TABLET(20 MG) BY MOUTH DAILY 90 tablet 3  . tamsulosin (FLOMAX) 0.4 MG CAPS capsule Take 1 capsule (0.4 mg total) by mouth daily. 60 capsule 6  . zolpidem (AMBIEN) 10 MG tablet TAKE 1/2 TABLET BY MOUTH EVERY NIGHT AT BEDTIME 90 tablet 1   Current Facility-Administered Medications  Medication Dose Route Frequency Provider Last Rate Last Dose  . 0.9 %  sodium chloride infusion  500 mL Intravenous Continuous Doran Stabler, MD        Allergies  Allergen Reactions  . Contrast Media [Iodinated Diagnostic Agents] Hives and Itching    Hives and itching after IV contrast injection  . Red Dye     hives  . Fluorescein Other (See Comments)  . Ioxaglate Itching and Hives    Hives and itching after IV contrast injection    Social History   Social History  . Marital status: Married    Spouse name: N/A  . Number of children: 1  . Years of education: N/A   Occupational History  . Nurse, learning disability     Social History Main Topics  . Smoking status: Former Smoker    Packs/day: 1.00    Years: 8.00    Types: Cigarettes    Quit date: 02/06/1970  . Smokeless tobacco: Never Used  . Alcohol use 4.2 oz/week    7 Standard drinks or equivalent per week     Comment: glass of wine most days  . Drug use: No  . Sexual activity: Not on file   Other Topics Concern  . Not on file   Social History Narrative  . No narrative on file    Family History  Problem Relation Age of Onset  . Heart attack Father 12  . Kidney disease Father   . Heart disease Father   . Colon cancer Father   . Thyroid cancer Mother   . Heart disease Mother   . Heart attack Paternal Grandmother   . Thyroid cancer Brother   . Diabetes Paternal Grandfather   . Stomach cancer Neg Hx     Review of Systems:  As stated in the HPI and otherwise negative.   BP 104/70   Pulse 69   Ht 6\' 2"  (1.88 m)   Wt 185 lb 12.8 oz (84.3 kg)   SpO2 95%   BMI 23.86 kg/m   Physical Examination: General: Well developed, well nourished, NAD  HEENT: OP clear, mucus membranes moist  SKIN: warm, dry. No rashes. Neuro: No focal deficits  Musculoskeletal: Muscle strength 5/5 all ext  Psychiatric: Mood and affect normal  Neck: No JVD, no carotid bruits, no thyromegaly, no lymphadenopathy.  Lungs:Clear bilaterally, no wheezes, rhonci, crackles Cardiovascular: Regular rate and rhythm. No murmurs, gallops or rubs. Abdomen:Soft. Bowel sounds present. Non-tender.  Extremities: No lower extremity edema. Pulses are 2 + in the bilateral DP/PT.  EKG:  EKG is ordered today. The ekg ordered today demonstrates Sinus, rate 63 bpm. 1st degree AV block.   Recent Labs: 11/28/2016: ALT 38; BUN 26; Creatinine, Ser 1.04; Hemoglobin 15.2; Platelets 149.0; Potassium 4.3; Sodium 136; TSH 1.24   Lipid Panel    Component Value Date/Time   CHOL 123 11/28/2016 0833   TRIG 95.0 11/28/2016 0833   TRIG 284 (HH) 09/20/2006 0929   HDL 38.30 (L) 11/28/2016  0833   CHOLHDL 3 11/28/2016 0833   VLDL 19.0  11/28/2016 0833   LDLCALC 66 11/28/2016 0833   LDLDIRECT 156.1 09/21/2007 0910     Wt Readings from Last 3 Encounters:  05/17/17 185 lb 12.8 oz (84.3 kg)  04/20/17 183 lb (83 kg)  04/17/17 183 lb (83 kg)     Other studies Reviewed: Additional studies/ records that were reviewed today include: . Review of the above records demonstrates:   Assessment and Plan:   1. Dizziness: He has dizziness only when bending over and this has been occurring for the last 5-6 years. He has rare palpitations but these are not associated with his episodes of dizziness. He feels well overall. EKG today with sinus rhythm. BP is stable. HR is in the 60s. No carotid bruits on exam. I have told him today that I do not think carotid artery dopplers are necessary. I have offered a 48 hour cardiac monitor if he has any palpitations or episodes of dizziness that occur outside of bending over.   2. PVCs: None noted on EKG today. Rare palpitations.   Current medicines are reviewed at length with the patient today.  The patient does not have concerns regarding medicines.  The following changes have been made:  no change  Labs/ tests ordered today include:  No orders of the defined types were placed in this encounter.    Disposition:   FU with me in 12 months   Signed, Lauree Chandler, MD 05/17/2017 9:22 AM    [facility] [address], [place], [place]  [address] Phone: [phone]; Fax: [phone]

## 2017-05-17 NOTE — Patient Instructions (Signed)

## 2017-06-01 ENCOUNTER — Encounter: Payer: Self-pay | Admitting: Family Medicine

## 2017-06-03 ENCOUNTER — Other Ambulatory Visit: Payer: Self-pay | Admitting: Family Medicine

## 2017-06-10 ENCOUNTER — Other Ambulatory Visit: Payer: Self-pay | Admitting: Family Medicine

## 2017-06-13 ENCOUNTER — Encounter: Payer: Self-pay | Admitting: Family Medicine

## 2017-06-13 ENCOUNTER — Encounter: Payer: Self-pay | Admitting: Cardiovascular Disease

## 2017-06-13 NOTE — Telephone Encounter (Signed)
This sounds good to me, stay on current regimen

## 2017-06-26 ENCOUNTER — Ambulatory Visit (INDEPENDENT_AMBULATORY_CARE_PROVIDER_SITE_OTHER): Payer: 59 | Admitting: Family Medicine

## 2017-06-26 ENCOUNTER — Encounter: Payer: Self-pay | Admitting: Family Medicine

## 2017-06-26 VITALS — BP 108/73 | HR 69 | Temp 97.9°F | Ht 74.0 in | Wt 185.0 lb

## 2017-06-26 DIAGNOSIS — M546 Pain in thoracic spine: Secondary | ICD-10-CM | POA: Diagnosis not present

## 2017-06-26 LAB — POC URINALSYSI DIPSTICK (AUTOMATED)
Bilirubin, UA: NEGATIVE
Glucose, UA: NEGATIVE
Ketones, UA: NEGATIVE
Leukocytes, UA: NEGATIVE
NITRITE UA: NEGATIVE
PH UA: 6 (ref 5.0–8.0)
PROTEIN UA: NEGATIVE
RBC UA: NEGATIVE
SPEC GRAV UA: 1.015 (ref 1.010–1.025)
UROBILINOGEN UA: 0.2 U/dL

## 2017-06-26 NOTE — Addendum Note (Signed)
Addended by: Aggie Hacker A on: 06/26/2017 10:55 AM   Modules accepted: Orders

## 2017-06-26 NOTE — Progress Notes (Signed)
   Subjective:    Patient ID: [name] [name]., male    DOB: 15-Feb-1946, 71 y.o.   MRN: [id number]  HPI Here for 2 weeks of intermittent sharp pains in the left middle back. No recent trauma. He thinks he has been urinating a little more frequently than usual, but there is no urgency or burning.    Review of Systems  Constitutional: Negative.   Respiratory: Negative.   Cardiovascular: Negative.   Gastrointestinal: Negative.   Genitourinary: Positive for frequency. Negative for dysuria, flank pain and urgency.       Objective:   Physical Exam  Constitutional: He appears well-developed and well-nourished. No distress.  Neck: No thyromegaly present.  Cardiovascular: Normal rate, regular rhythm, normal heart sounds and intact distal pulses.   Pulmonary/Chest: Effort normal and breath sounds normal. No respiratory distress. He has no wheezes. He has no rales.  Abdominal: Soft. Bowel sounds are normal. He exhibits no distension and no mass. There is no tenderness. There is no rebound and no guarding.  Musculoskeletal:  He is very tender in a spot along the left middle back. No spasm, full ROM   Lymphadenopathy:    He has no cervical adenopathy.          Assessment & Plan:  Mechanical back pain. Use heat and 800 mg of Ibuprofen several times a day. Recheck prn.  Alysia Penna, MD

## 2017-06-26 NOTE — Patient Instructions (Signed)
WE NOW OFFER   [facility] FAST TRACK!!!  SAME DAY Appointments for ACUTE CARE  Such as: Sprains, Injuries, cuts, abrasions, rashes, muscle pain, joint pain, back pain Colds, flu, sore throats, headache, allergies, cough, fever  Ear pain, sinus and eye infections Abdominal pain, nausea, vomiting, diarrhea, upset stomach Animal/insect bites  3 Easy Ways to Schedule: Walk-In Scheduling Call in scheduling Mychart Sign-up: [email or web address]         

## 2017-07-25 ENCOUNTER — Other Ambulatory Visit: Payer: Self-pay | Admitting: Family Medicine

## 2017-07-25 DIAGNOSIS — E119 Type 2 diabetes mellitus without complications: Secondary | ICD-10-CM

## 2017-07-25 DIAGNOSIS — E785 Hyperlipidemia, unspecified: Secondary | ICD-10-CM

## 2017-07-26 DIAGNOSIS — E119 Type 2 diabetes mellitus without complications: Secondary | ICD-10-CM | POA: Insufficient documentation

## 2017-07-26 MED ORDER — METFORMIN HCL 1000 MG PO TABS: 1000.0000 mg | ORAL_TABLET | Freq: Two times a day (BID) | ORAL | 0 refills | Status: DC

## 2017-07-26 NOTE — Addendum Note (Signed)
Addended by: Alysia Penna A on: 07/26/2017 12:15 PM   Modules accepted: Orders

## 2017-07-26 NOTE — Telephone Encounter (Signed)
I ordered lipids, liver, and A1c. He can make a lab appt. In the meantime refill the metformin for one year

## 2017-07-26 NOTE — Telephone Encounter (Signed)
We need to order A1c, anything else?

## 2017-07-26 NOTE — Addendum Note (Signed)
Addended by: Myriam Forehand on: 07/26/2017 02:10 PM   Modules accepted: Orders

## 2017-07-31 ENCOUNTER — Other Ambulatory Visit (INDEPENDENT_AMBULATORY_CARE_PROVIDER_SITE_OTHER): Payer: 59

## 2017-07-31 DIAGNOSIS — E785 Hyperlipidemia, unspecified: Secondary | ICD-10-CM

## 2017-07-31 DIAGNOSIS — E119 Type 2 diabetes mellitus without complications: Secondary | ICD-10-CM | POA: Diagnosis not present

## 2017-07-31 LAB — LIPID PANEL
CHOL/HDL RATIO: 3
Cholesterol: 134 mg/dL (ref 0–200)
HDL: 43.1 mg/dL (ref >39.00)
LDL CALC: 73 mg/dL (ref 0–99)
NonHDL: 90.72
TRIGLYCERIDES: 89 mg/dL (ref 0.0–149.0)
VLDL: 17.8 mg/dL (ref 0.0–40.0)

## 2017-07-31 LAB — HEPATIC FUNCTION PANEL
ALT: 20 U/L (ref 0–53)
AST: 18 U/L (ref 0–37)
Albumin: 4.3 g/dL (ref 3.5–5.2)
Alkaline Phosphatase: 44 U/L (ref 39–117)
BILIRUBIN DIRECT: 0.2 mg/dL (ref 0.0–0.3)
BILIRUBIN TOTAL: 1 mg/dL (ref 0.2–1.2)
Total Protein: 6.2 g/dL (ref 6.0–8.3)

## 2017-07-31 LAB — HEMOGLOBIN A1C: Hgb A1c MFr Bld: 6.8 % — ABNORMAL HIGH (ref 4.6–6.5)

## 2017-08-25 ENCOUNTER — Encounter: Payer: Self-pay | Admitting: Family Medicine

## 2017-10-01 ENCOUNTER — Encounter: Payer: Self-pay | Admitting: Family Medicine

## 2017-10-02 NOTE — Telephone Encounter (Signed)
Please send in Contour test strips daily, #90 with 3 rf

## 2017-10-03 ENCOUNTER — Telehealth: Payer: Self-pay

## 2017-10-03 MED ORDER — GLUCOSE BLOOD VI STRP: ORAL_STRIP | 3 refills | Status: DC

## 2017-10-03 NOTE — Telephone Encounter (Signed)
Please send in Contour test strips daily, #90 with 3 rf

## 2017-10-18 ENCOUNTER — Other Ambulatory Visit: Payer: Self-pay | Admitting: Family Medicine

## 2017-10-19 NOTE — Telephone Encounter (Signed)
Last OV 06/26/2017  Last refilled 04/14/2017 disp 90 with 1 refill sent to PCP for approval

## 2017-10-20 NOTE — Telephone Encounter (Signed)
Refill for 6 months. 

## 2017-10-20 NOTE — Telephone Encounter (Signed)
Rx has been called in to the pharmacy. 

## 2017-10-21 ENCOUNTER — Other Ambulatory Visit: Payer: Self-pay | Admitting: Family Medicine

## 2017-10-24 DIAGNOSIS — Z961 Presence of intraocular lens: Secondary | ICD-10-CM | POA: Diagnosis not present

## 2017-10-24 DIAGNOSIS — E119 Type 2 diabetes mellitus without complications: Secondary | ICD-10-CM | POA: Diagnosis not present

## 2017-10-24 DIAGNOSIS — H30033 Focal chorioretinal inflammation, peripheral, bilateral: Secondary | ICD-10-CM | POA: Diagnosis not present

## 2017-10-24 DIAGNOSIS — H3581 Retinal edema: Secondary | ICD-10-CM | POA: Diagnosis not present

## 2017-10-26 DIAGNOSIS — H44113 Panuveitis, bilateral: Secondary | ICD-10-CM | POA: Diagnosis not present

## 2017-10-26 DIAGNOSIS — H30033 Focal chorioretinal inflammation, peripheral, bilateral: Secondary | ICD-10-CM | POA: Diagnosis not present

## 2017-11-20 ENCOUNTER — Other Ambulatory Visit: Payer: Self-pay | Admitting: Family Medicine

## 2017-11-20 NOTE — Telephone Encounter (Signed)
Last OV 06/26/2017   tamsulosin last refilled 06/06/2017 disp 30 with 5 refills   Rosuvastatin last refilled 06/06/2017 disp 90 with 1 refill   Sent to PCP for approval to fill tamsulosin

## 2017-11-21 DIAGNOSIS — H401112 Primary open-angle glaucoma, right eye, moderate stage: Secondary | ICD-10-CM | POA: Diagnosis not present

## 2017-11-21 DIAGNOSIS — H401123 Primary open-angle glaucoma, left eye, severe stage: Secondary | ICD-10-CM | POA: Diagnosis not present

## 2017-11-21 DIAGNOSIS — H26491 Other secondary cataract, right eye: Secondary | ICD-10-CM | POA: Diagnosis not present

## 2017-11-21 DIAGNOSIS — H20021 Recurrent acute iridocyclitis, right eye: Secondary | ICD-10-CM | POA: Diagnosis not present

## 2017-11-28 DIAGNOSIS — L812 Freckles: Secondary | ICD-10-CM | POA: Diagnosis not present

## 2017-11-28 DIAGNOSIS — L821 Other seborrheic keratosis: Secondary | ICD-10-CM | POA: Diagnosis not present

## 2017-11-28 DIAGNOSIS — L905 Scar conditions and fibrosis of skin: Secondary | ICD-10-CM | POA: Diagnosis not present

## 2017-11-28 DIAGNOSIS — I788 Other diseases of capillaries: Secondary | ICD-10-CM | POA: Diagnosis not present

## 2017-11-28 DIAGNOSIS — L570 Actinic keratosis: Secondary | ICD-10-CM | POA: Diagnosis not present

## 2017-11-28 DIAGNOSIS — D1801 Hemangioma of skin and subcutaneous tissue: Secondary | ICD-10-CM | POA: Diagnosis not present

## 2017-12-04 ENCOUNTER — Encounter: Payer: Self-pay | Admitting: Family Medicine

## 2017-12-05 NOTE — Telephone Encounter (Signed)
I wrote a rx he can pick up

## 2017-12-06 ENCOUNTER — Encounter: Payer: 59 | Admitting: Family Medicine

## 2017-12-08 ENCOUNTER — Encounter: Payer: Self-pay | Admitting: Family Medicine

## 2017-12-08 ENCOUNTER — Ambulatory Visit (INDEPENDENT_AMBULATORY_CARE_PROVIDER_SITE_OTHER): Payer: BLUE CROSS/BLUE SHIELD | Admitting: Family Medicine

## 2017-12-08 VITALS — BP 102/64 | HR 69 | Temp 97.9°F | Ht 72.5 in | Wt 186.2 lb

## 2017-12-08 DIAGNOSIS — Z0000 Encounter for general adult medical examination without abnormal findings: Secondary | ICD-10-CM | POA: Diagnosis not present

## 2017-12-08 DIAGNOSIS — E119 Type 2 diabetes mellitus without complications: Secondary | ICD-10-CM | POA: Diagnosis not present

## 2017-12-08 LAB — POC URINALSYSI DIPSTICK (AUTOMATED)
Bilirubin, UA: NEGATIVE
Blood, UA: NEGATIVE
Glucose, UA: NEGATIVE
KETONES UA: NEGATIVE
LEUKOCYTES UA: NEGATIVE
NITRITE UA: NEGATIVE
PH UA: 6 (ref 5.0–8.0)
PROTEIN UA: NEGATIVE
Spec Grav, UA: 1.025 (ref 1.010–1.025)
Urobilinogen, UA: 0.2 E.U./dL

## 2017-12-08 LAB — CBC WITH DIFFERENTIAL/PLATELET
BASOS ABS: 0 10*3/uL (ref 0.0–0.1)
Basophils Relative: 0.6 % (ref 0.0–3.0)
Eosinophils Absolute: 0.2 10*3/uL (ref 0.0–0.7)
Eosinophils Relative: 3.7 % (ref 0.0–5.0)
HCT: 42.3 % (ref 39.0–52.0)
Hemoglobin: 14.9 g/dL (ref 13.0–17.0)
Lymphocytes Relative: 26.6 % (ref 12.0–46.0)
Lymphs Abs: 1.2 10*3/uL (ref 0.7–4.0)
MCHC: 35.3 g/dL (ref 30.0–36.0)
MCV: 104.4 fl — AB (ref 78.0–100.0)
Monocytes Absolute: 0.6 10*3/uL (ref 0.1–1.0)
Monocytes Relative: 11.9 % (ref 3.0–12.0)
NEUTROS PCT: 57.2 % (ref 43.0–77.0)
Neutro Abs: 2.7 10*3/uL (ref 1.4–7.7)
Platelets: 145 10*3/uL — ABNORMAL LOW (ref 150.0–400.0)
RBC: 4.05 Mil/uL — AB (ref 4.22–5.81)
RDW: 13 % (ref 11.5–15.5)
WBC: 4.7 10*3/uL (ref 4.0–10.5)

## 2017-12-08 LAB — BASIC METABOLIC PANEL
BUN: 28 mg/dL — ABNORMAL HIGH (ref 6–23)
CALCIUM: 9.6 mg/dL (ref 8.4–10.5)
CO2: 29 meq/L (ref 19–32)
CREATININE: 0.91 mg/dL (ref 0.40–1.50)
Chloride: 99 mEq/L (ref 96–112)
GFR: 86.92 mL/min (ref >60.00)
Glucose, Bld: 160 mg/dL — ABNORMAL HIGH (ref 70–99)
Potassium: 4.3 mEq/L (ref 3.5–5.1)
Sodium: 135 mEq/L (ref 135–145)

## 2017-12-08 LAB — HEPATIC FUNCTION PANEL
ALBUMIN: 4.2 g/dL (ref 3.5–5.2)
ALK PHOS: 44 U/L (ref 39–117)
ALT: 24 U/L (ref 0–53)
AST: 16 U/L (ref 0–37)
BILIRUBIN DIRECT: 0.2 mg/dL (ref 0.0–0.3)
TOTAL PROTEIN: 6.4 g/dL (ref 6.0–8.3)
Total Bilirubin: 1.2 mg/dL (ref 0.2–1.2)

## 2017-12-08 LAB — HEMOGLOBIN A1C: HEMOGLOBIN A1C: 6.6 % — AB (ref 4.6–6.5)

## 2017-12-08 LAB — LIPID PANEL
Cholesterol: 120 mg/dL (ref 0–200)
HDL: 40.7 mg/dL (ref >39.00)
LDL Cholesterol: 59 mg/dL (ref 0–99)
NONHDL: 79.08
Total CHOL/HDL Ratio: 3
Triglycerides: 101 mg/dL (ref 0.0–149.0)
VLDL: 20.2 mg/dL (ref 0.0–40.0)

## 2017-12-08 LAB — TSH: TSH: 1.13 u[IU]/mL (ref 0.35–4.50)

## 2017-12-08 LAB — PSA: PSA: 1.88 ng/mL (ref 0.10–4.00)

## 2017-12-08 MED ORDER — TAMSULOSIN HCL 0.4 MG PO CAPS: 0.8000 mg | ORAL_CAPSULE | Freq: Every day | ORAL | 6 refills | Status: DC

## 2017-12-08 NOTE — Progress Notes (Signed)
   Subjective:    Patient ID: [name] Emerald., male    DOB: 07-13-1946, 72 y.o.   MRN: [id number]  HPI Here for a well exam. He feels great, but he does mention having to take a long time to start urinating. He had been on 0.8 mg of Flomax until last year when he went back down to 0.4 mg daily.    Review of Systems  Constitutional: Negative.   HENT: Negative.   Eyes: Negative.   Respiratory: Negative.   Cardiovascular: Negative.   Gastrointestinal: Negative.   Genitourinary: Positive for difficulty urinating. Negative for decreased urine volume, discharge, dysuria, enuresis, flank pain, frequency, genital sores, hematuria, penile pain, penile swelling, scrotal swelling, testicular pain and urgency.  Musculoskeletal: Negative.   Skin: Negative.   Neurological: Negative.   Psychiatric/Behavioral: Negative.        Objective:   Physical Exam  Constitutional: He is oriented to person, place, and time. He appears well-developed and well-nourished. No distress.  HENT:  Head: Normocephalic and atraumatic.  Right Ear: External ear normal.  Left Ear: External ear normal.  Nose: Nose normal.  Mouth/Throat: Oropharynx is clear and moist. No oropharyngeal exudate.  Eyes: Pupils are equal, round, and reactive to light. Conjunctivae and EOM are normal. Right eye exhibits no discharge. Left eye exhibits no discharge. No scleral icterus.  Neck: Neck supple. No JVD present. No tracheal deviation present. No thyromegaly present.  Cardiovascular: Normal rate, regular rhythm, normal heart sounds and intact distal pulses. Exam reveals no gallop and no friction rub.  No murmur heard. Pulmonary/Chest: Effort normal and breath sounds normal. No respiratory distress. He has no wheezes. He has no rales. He exhibits no tenderness.  Abdominal: Soft. Bowel sounds are normal. He exhibits no distension and no mass. There is no tenderness. There is no rebound and no guarding.  Genitourinary: Rectum normal  and penis normal. Rectal exam shows guaiac negative stool. No penile tenderness.  Genitourinary Comments: Prostate moderately enlarged, not tender or nodular   Musculoskeletal: Normal range of motion. He exhibits no edema or tenderness.  Lymphadenopathy:    He has no cervical adenopathy.  Neurological: He is alert and oriented to person, place, and time. He has normal reflexes. No cranial nerve deficit. He exhibits normal muscle tone. Coordination normal.  Skin: Skin is warm and dry. No rash noted. He is not diaphoretic. No erythema. No pallor.  Psychiatric: He has a normal mood and affect. His behavior is normal. Judgment and thought content normal.          Assessment & Plan:  Well exam. We discussed diet and exercise. Get fasting labs. For the BPH he will increase the Flomax to 2 pills a day.  Alysia Penna, MD

## 2017-12-10 ENCOUNTER — Encounter: Payer: Self-pay | Admitting: Family Medicine

## 2017-12-12 NOTE — Telephone Encounter (Signed)
Change the Zolpidem to a whole tablet every night. Call in #90 with one rf

## 2017-12-13 ENCOUNTER — Other Ambulatory Visit: Payer: Self-pay

## 2017-12-13 ENCOUNTER — Encounter: Payer: Self-pay | Admitting: Family Medicine

## 2017-12-13 MED ORDER — ZOLPIDEM TARTRATE 10 MG PO TABS: 10.0000 mg | ORAL_TABLET | Freq: Every day | ORAL | 1 refills | Status: DC

## 2017-12-13 NOTE — Telephone Encounter (Signed)
Change the Zolpidem to a whole tablet every night. Call in #90 with one rf

## 2017-12-15 ENCOUNTER — Encounter: Payer: Self-pay | Admitting: Family Medicine

## 2017-12-15 ENCOUNTER — Ambulatory Visit (INDEPENDENT_AMBULATORY_CARE_PROVIDER_SITE_OTHER): Payer: BLUE CROSS/BLUE SHIELD | Admitting: Family Medicine

## 2017-12-15 VITALS — BP 100/54 | HR 75 | Temp 97.8°F | Ht 72.5 in | Wt 186.6 lb

## 2017-12-15 DIAGNOSIS — N390 Urinary tract infection, site not specified: Secondary | ICD-10-CM

## 2017-12-15 DIAGNOSIS — R300 Dysuria: Secondary | ICD-10-CM

## 2017-12-15 LAB — POCT URINALYSIS DIPSTICK
BILIRUBIN UA: NEGATIVE
Glucose, UA: NEGATIVE
Ketones, UA: NEGATIVE
Nitrite, UA: NEGATIVE
Spec Grav, UA: 1.015 (ref 1.010–1.025)
Urobilinogen, UA: 0.2 E.U./dL
pH, UA: 6.5 (ref 5.0–8.0)

## 2017-12-15 MED ORDER — CEFTRIAXONE SODIUM 1 G IJ SOLR
1.0000 g | Freq: Once | INTRAMUSCULAR | Status: AC
  Administered 2017-12-15: 1 g via INTRAMUSCULAR

## 2017-12-15 MED ORDER — SULFAMETHOXAZOLE-TRIMETHOPRIM 800-160 MG PO TABS: 1.0000 | ORAL_TABLET | Freq: Two times a day (BID) | ORAL | 0 refills | Status: DC

## 2017-12-15 NOTE — Progress Notes (Signed)
   Subjective:    Patient ID: [name] [name]., male    DOB: 09/18/1945, 72 y.o.   MRN: [id number]  HPI Here for 3 days of fever to 101.6 degrees, low back pain, burning in urination and urgency. Drinking lots of water.    Review of Systems  Constitutional: Positive for fever.  Respiratory: Negative.   Cardiovascular: Negative.   Gastrointestinal: Negative.   Genitourinary: Positive for dysuria, frequency and urgency. Negative for flank pain and hematuria.       Objective:   Physical Exam  Constitutional: He appears well-developed and well-nourished.  Cardiovascular: Normal rate, regular rhythm, normal heart sounds and intact distal pulses.  Pulmonary/Chest: Effort normal and breath sounds normal. No respiratory distress. He has no wheezes. He has no rales.  Abdominal: Soft. Bowel sounds are normal. He exhibits no distension and no mass. There is no tenderness. There is no rebound and no guarding.          Assessment & Plan:  UTI, treat with Bactrim DS. Given a shot of Rocephin. Culture the sample.  Alysia Penna, MD

## 2017-12-15 NOTE — Addendum Note (Signed)
Addended by: Myriam Forehand on: 12/15/2017 01:46 PM   Modules accepted: Orders

## 2017-12-17 LAB — URINE CULTURE
MICRO NUMBER: 90423156
SPECIMEN QUALITY: ADEQUATE

## 2017-12-18 ENCOUNTER — Encounter: Payer: Self-pay | Admitting: Family Medicine

## 2017-12-18 NOTE — Telephone Encounter (Signed)
Tell him to decrease the Flomax back to one pill a day. Also call in Proscar 5 mg to take daily, #90 with 3 rf. This works in a different way than the [employer]

## 2017-12-19 ENCOUNTER — Other Ambulatory Visit: Payer: Self-pay

## 2017-12-19 MED ORDER — FINASTERIDE 5 MG PO TABS: 5.0000 mg | ORAL_TABLET | Freq: Every day | ORAL | 3 refills | Status: DC

## 2017-12-19 NOTE — Telephone Encounter (Signed)
The bacteria likely came from his skin, like they usually do. I suggest we repeat a UA after the antibiotic is done

## 2017-12-19 NOTE — Telephone Encounter (Signed)
Send in Rx for Proscar 5 MG take once daily disp 90 with 3 refills   Sent to [facility]   Rx has been sent

## 2017-12-22 ENCOUNTER — Telehealth: Payer: Self-pay

## 2017-12-22 NOTE — Telephone Encounter (Signed)
Copied from [place] [id number]. Topic: Inquiry >> Dec 19, 2017  2:03 PM Arletha Grippe wrote: Reason for CRM: wife ginni would like to have call back regarding labs. Please call [phone]

## 2017-12-25 ENCOUNTER — Encounter: Payer: Self-pay | Admitting: Family Medicine

## 2017-12-25 ENCOUNTER — Ambulatory Visit (INDEPENDENT_AMBULATORY_CARE_PROVIDER_SITE_OTHER): Payer: BLUE CROSS/BLUE SHIELD | Admitting: Family Medicine

## 2017-12-25 VITALS — BP 102/68 | HR 62 | Temp 97.7°F | Ht 72.5 in | Wt 188.4 lb

## 2017-12-25 DIAGNOSIS — N390 Urinary tract infection, site not specified: Secondary | ICD-10-CM | POA: Diagnosis not present

## 2017-12-25 LAB — POCT URINALYSIS DIPSTICK
BILIRUBIN UA: NEGATIVE
Blood, UA: NEGATIVE
Glucose, UA: NEGATIVE
Ketones, UA: NEGATIVE
Leukocytes, UA: NEGATIVE
Nitrite, UA: NEGATIVE
PROTEIN UA: NEGATIVE
SPEC GRAV UA: 1.02 (ref 1.010–1.025)
Urobilinogen, UA: 0.2 E.U./dL
pH, UA: 6 (ref 5.0–8.0)

## 2017-12-25 NOTE — Telephone Encounter (Signed)
Called and spoke with pt. Pt advised and voiced understanding.  

## 2017-12-25 NOTE — Progress Notes (Signed)
   Subjective:    Patient ID: [name] [name]., male    DOB: 21-Jan-1946, 72 y.o.   MRN: [id number]  HPI Here to follow up a UTI. His culture grew MRSA and he completed a course of Bactrim. He feels fine now.    Review of Systems  Constitutional: Negative.   Respiratory: Negative.   Cardiovascular: Negative.   Genitourinary: Negative.        Objective:   Physical Exam  Constitutional: He appears well-developed and well-nourished.  Cardiovascular: Normal rate, regular rhythm, normal heart sounds and intact distal pulses.  Pulmonary/Chest: Effort normal and breath sounds normal. No respiratory distress. He has no wheezes. He has no rales.          Assessment & Plan:  UTI, resolved.  Alysia Penna, MD

## 2017-12-26 DIAGNOSIS — H3581 Retinal edema: Secondary | ICD-10-CM | POA: Diagnosis not present

## 2017-12-26 DIAGNOSIS — Z7984 Long term (current) use of oral hypoglycemic drugs: Secondary | ICD-10-CM | POA: Diagnosis not present

## 2017-12-26 DIAGNOSIS — Z79899 Other long term (current) drug therapy: Secondary | ICD-10-CM | POA: Diagnosis not present

## 2017-12-26 DIAGNOSIS — E119 Type 2 diabetes mellitus without complications: Secondary | ICD-10-CM | POA: Diagnosis not present

## 2017-12-26 DIAGNOSIS — H30033 Focal chorioretinal inflammation, peripheral, bilateral: Secondary | ICD-10-CM | POA: Diagnosis not present

## 2017-12-29 DIAGNOSIS — H30033 Focal chorioretinal inflammation, peripheral, bilateral: Secondary | ICD-10-CM | POA: Diagnosis not present

## 2017-12-29 DIAGNOSIS — H44113 Panuveitis, bilateral: Secondary | ICD-10-CM | POA: Diagnosis not present

## 2017-12-29 DIAGNOSIS — Z79899 Other long term (current) drug therapy: Secondary | ICD-10-CM | POA: Diagnosis not present

## 2018-01-14 ENCOUNTER — Other Ambulatory Visit: Payer: Self-pay | Admitting: Family Medicine

## 2018-01-24 DIAGNOSIS — L821 Other seborrheic keratosis: Secondary | ICD-10-CM | POA: Diagnosis not present

## 2018-01-24 DIAGNOSIS — H20013 Primary iridocyclitis, bilateral: Secondary | ICD-10-CM | POA: Diagnosis not present

## 2018-01-25 ENCOUNTER — Encounter: Payer: Self-pay | Admitting: Family Medicine

## 2018-01-29 NOTE — Telephone Encounter (Signed)
It would be fine to stop the aspirin a week before this surgery

## 2018-02-23 ENCOUNTER — Other Ambulatory Visit: Payer: Self-pay | Admitting: Family Medicine

## 2018-03-12 DIAGNOSIS — L821 Other seborrheic keratosis: Secondary | ICD-10-CM | POA: Diagnosis not present

## 2018-03-20 DIAGNOSIS — H30033 Focal chorioretinal inflammation, peripheral, bilateral: Secondary | ICD-10-CM | POA: Diagnosis not present

## 2018-03-20 DIAGNOSIS — Z79899 Other long term (current) drug therapy: Secondary | ICD-10-CM | POA: Diagnosis not present

## 2018-03-20 DIAGNOSIS — H3581 Retinal edema: Secondary | ICD-10-CM | POA: Diagnosis not present

## 2018-03-20 DIAGNOSIS — Z961 Presence of intraocular lens: Secondary | ICD-10-CM | POA: Diagnosis not present

## 2018-03-20 DIAGNOSIS — E119 Type 2 diabetes mellitus without complications: Secondary | ICD-10-CM | POA: Diagnosis not present

## 2018-04-05 DIAGNOSIS — Z79899 Other long term (current) drug therapy: Secondary | ICD-10-CM | POA: Diagnosis not present

## 2018-04-05 DIAGNOSIS — H30033 Focal chorioretinal inflammation, peripheral, bilateral: Secondary | ICD-10-CM | POA: Diagnosis not present

## 2018-04-05 DIAGNOSIS — H44113 Panuveitis, bilateral: Secondary | ICD-10-CM | POA: Diagnosis not present

## 2018-04-10 DIAGNOSIS — H401112 Primary open-angle glaucoma, right eye, moderate stage: Secondary | ICD-10-CM | POA: Diagnosis not present

## 2018-04-10 DIAGNOSIS — H20021 Recurrent acute iridocyclitis, right eye: Secondary | ICD-10-CM | POA: Diagnosis not present

## 2018-04-10 DIAGNOSIS — H401123 Primary open-angle glaucoma, left eye, severe stage: Secondary | ICD-10-CM | POA: Diagnosis not present

## 2018-04-10 DIAGNOSIS — H1711 Central corneal opacity, right eye: Secondary | ICD-10-CM | POA: Diagnosis not present

## 2018-05-25 ENCOUNTER — Other Ambulatory Visit: Payer: Self-pay | Admitting: Family Medicine

## 2018-06-11 ENCOUNTER — Encounter: Payer: Self-pay | Admitting: Family Medicine

## 2018-06-12 ENCOUNTER — Ambulatory Visit (INDEPENDENT_AMBULATORY_CARE_PROVIDER_SITE_OTHER): Payer: BLUE CROSS/BLUE SHIELD | Admitting: Family Medicine

## 2018-06-12 ENCOUNTER — Encounter: Payer: Self-pay | Admitting: Family Medicine

## 2018-06-12 VITALS — BP 126/62 | HR 76 | Temp 98.3°F | Wt 188.1 lb

## 2018-06-12 DIAGNOSIS — N390 Urinary tract infection, site not specified: Secondary | ICD-10-CM

## 2018-06-12 DIAGNOSIS — R300 Dysuria: Secondary | ICD-10-CM | POA: Diagnosis not present

## 2018-06-12 DIAGNOSIS — Z961 Presence of intraocular lens: Secondary | ICD-10-CM | POA: Diagnosis not present

## 2018-06-12 DIAGNOSIS — H30033 Focal chorioretinal inflammation, peripheral, bilateral: Secondary | ICD-10-CM | POA: Diagnosis not present

## 2018-06-12 DIAGNOSIS — Z23 Encounter for immunization: Secondary | ICD-10-CM

## 2018-06-12 DIAGNOSIS — H3581 Retinal edema: Secondary | ICD-10-CM | POA: Diagnosis not present

## 2018-06-12 DIAGNOSIS — E119 Type 2 diabetes mellitus without complications: Secondary | ICD-10-CM | POA: Diagnosis not present

## 2018-06-12 LAB — POCT URINALYSIS DIPSTICK
BILIRUBIN UA: NEGATIVE
Blood, UA: POSITIVE
GLUCOSE UA: NEGATIVE
Ketones, UA: POSITIVE
Nitrite, UA: NEGATIVE
Protein, UA: POSITIVE — AB
Spec Grav, UA: 1.02 (ref 1.010–1.025)
Urobilinogen, UA: 1 E.U./dL
pH, UA: 6 (ref 5.0–8.0)

## 2018-06-12 MED ORDER — SULFAMETHOXAZOLE-TRIMETHOPRIM 800-160 MG PO TABS: 1.0000 | ORAL_TABLET | Freq: Two times a day (BID) | ORAL | 0 refills | Status: DC

## 2018-06-12 NOTE — Progress Notes (Signed)
   Subjective:    Patient ID: [name] [name]., male    DOB: 1946-04-20, 72 y.o.   MRN: [id number]  HPI Here for 3 days of urinary burning and frequency. No back pain or fever. He was treated for a MRSA UTI in April.    Review of Systems  Constitutional: Negative.   Respiratory: Negative.   Cardiovascular: Negative.   Genitourinary: Positive for dysuria and frequency. Negative for hematuria and urgency.       Objective:   Physical Exam  Constitutional: He appears well-developed and well-nourished.  Cardiovascular: Normal rate, regular rhythm, normal heart sounds and intact distal pulses.  Pulmonary/Chest: Effort normal and breath sounds normal.  Abdominal: Soft. Bowel sounds are normal. He exhibits no distension and no mass. There is no tenderness. There is no rebound and no guarding. No hernia.          Assessment & Plan:  UTI, treat with Bactrim DS. Culture the sample. Drink plenty of water.  Alysia Penna, MD

## 2018-06-12 NOTE — Addendum Note (Signed)
Addended by: Elie Confer on: 06/12/2018 12:26 PM   Modules accepted: Orders

## 2018-06-13 LAB — URINE CULTURE
MICRO NUMBER: 91177884
SPECIMEN QUALITY: ADEQUATE

## 2018-06-15 ENCOUNTER — Encounter: Payer: Self-pay | Admitting: *Deleted

## 2018-06-15 DIAGNOSIS — D0439 Carcinoma in situ of skin of other parts of face: Secondary | ICD-10-CM | POA: Diagnosis not present

## 2018-06-15 DIAGNOSIS — L570 Actinic keratosis: Secondary | ICD-10-CM | POA: Diagnosis not present

## 2018-06-18 DIAGNOSIS — H3581 Retinal edema: Secondary | ICD-10-CM | POA: Diagnosis not present

## 2018-06-18 DIAGNOSIS — H44113 Panuveitis, bilateral: Secondary | ICD-10-CM | POA: Diagnosis not present

## 2018-06-18 DIAGNOSIS — H30033 Focal chorioretinal inflammation, peripheral, bilateral: Secondary | ICD-10-CM | POA: Diagnosis not present

## 2018-06-18 DIAGNOSIS — Z79899 Other long term (current) drug therapy: Secondary | ICD-10-CM | POA: Diagnosis not present

## 2018-07-16 ENCOUNTER — Other Ambulatory Visit: Payer: Self-pay | Admitting: Family Medicine

## 2018-07-19 ENCOUNTER — Other Ambulatory Visit: Payer: Self-pay | Admitting: Family Medicine

## 2018-07-21 ENCOUNTER — Other Ambulatory Visit: Payer: Self-pay | Admitting: Family Medicine

## 2018-07-24 ENCOUNTER — Other Ambulatory Visit: Payer: Self-pay | Admitting: Family Medicine

## 2018-07-25 ENCOUNTER — Other Ambulatory Visit: Payer: Self-pay | Admitting: Family Medicine

## 2018-07-25 NOTE — Telephone Encounter (Signed)
Out of script for 3 days and patients wants this called in asap.

## 2018-07-26 DIAGNOSIS — C44329 Squamous cell carcinoma of skin of other parts of face: Secondary | ICD-10-CM | POA: Diagnosis not present

## 2018-07-26 NOTE — Telephone Encounter (Signed)
I sent these in  

## 2018-08-02 DIAGNOSIS — Z4802 Encounter for removal of sutures: Secondary | ICD-10-CM | POA: Diagnosis not present

## 2018-08-07 DIAGNOSIS — H20021 Recurrent acute iridocyclitis, right eye: Secondary | ICD-10-CM | POA: Diagnosis not present

## 2018-08-07 DIAGNOSIS — H401112 Primary open-angle glaucoma, right eye, moderate stage: Secondary | ICD-10-CM | POA: Diagnosis not present

## 2018-08-07 DIAGNOSIS — H43812 Vitreous degeneration, left eye: Secondary | ICD-10-CM | POA: Diagnosis not present

## 2018-08-07 DIAGNOSIS — H401123 Primary open-angle glaucoma, left eye, severe stage: Secondary | ICD-10-CM | POA: Diagnosis not present

## 2018-08-09 ENCOUNTER — Encounter: Payer: Self-pay | Admitting: Family Medicine

## 2018-08-12 ENCOUNTER — Ambulatory Visit (HOSPITAL_COMMUNITY)
Admission: EM | Admit: 2018-08-12 | Discharge: 2018-08-12 | Disposition: A | Discharge status: Home / Self Care | Payer: BLUE CROSS/BLUE SHIELD | Attending: Family Medicine | Admitting: Family Medicine

## 2018-08-12 ENCOUNTER — Other Ambulatory Visit: Payer: Self-pay

## 2018-08-12 ENCOUNTER — Encounter (HOSPITAL_COMMUNITY): Payer: Self-pay | Admitting: Emergency Medicine

## 2018-08-12 DIAGNOSIS — L03031 Cellulitis of right toe: Secondary | ICD-10-CM | POA: Diagnosis not present

## 2018-08-12 MED ORDER — DOXYCYCLINE HYCLATE 100 MG PO TABS: 100.0000 mg | ORAL_TABLET | Freq: Two times a day (BID) | ORAL | 0 refills | Status: DC

## 2018-08-12 NOTE — ED Provider Notes (Signed)
[place]    CSN: [id number] Arrival date & time: 08/12/18  1011     History   Chief Complaint Chief Complaint  Patient presents with  . Nail Problem    HPI [name] [name]. is a 72 y.o. male.   This is an initial visit for this 72 year old man.The patient presented to the [facility] with a complaint of redness and pain to the great toe on his right foot that he "stubbed" 11 days ago.  It is becoming quite red with some dorsal foot swelling.  Not very tender.     Past Medical History:  Diagnosis Date  . Cataract    Bil/ right eye surgery  . Diabetes mellitus    type II  . Diverticulitis of colon   . ED (erectile dysfunction)   . Gout    no meds  . H/O echocardiogram 2013   normal   . History of irregular heartbeat    Per pt, heart skips beats at times/had Echo  . Hyperlipidemia   . Hypertension   . Iritis, recurrent     sees Dr. Silvestre Gunner at [facility]   . Normal cardiac stress test 09/27/2002  . Palpitations    PVCs and PACs per event monitor  . Post-operative nausea and vomiting   . Uveitis    sees Dr Polly Cobia at [facility] Dr Manuella Ghazi now    Patient Active Problem List   Diagnosis Date Noted  . Type 2 diabetes mellitus without complication, without long-term current use of insulin ([place]) 07/26/2017  . Palpitations 02/07/2012  . OTHER TENOSYNOVITIS OF HAND AND WRIST 06/01/2008  . Dyslipidemia 03/05/2007  . GOUT 03/05/2007  . Essential hypertension 03/05/2007  . DIVERTICULOSIS, COLON 03/05/2007  . COLONIC POLYPS, BENIGN, HX OF 03/05/2007  . BPH with urinary obstruction 03/05/2007    Past Surgical History:  Procedure Laterality Date  . APPENDECTOMY    . CATARACT EXTRACTION     Dr Katy Fitch , left eye/ 2 years ago  . CATARACT EXTRACTION  03/16/2017   Right eye  . COLONOSCOPY  02-27-12   per Dr. Deatra Ina, clear, repeat in 5 yrs   . SHOULDER ARTHROSCOPY     both shoulders   . TONSILLECTOMY    . TONSILLECTOMY AND ADENOIDECTOMY    . tubes in ears      55 years ago       Home Medications    Prior to Admission medications   Medication Sig Start Date End Date Taking? Authorizing Provider  Adalimumab (HUMIRA) 40 MG/0.4ML PSKT Inject into the skin. Humira 40 mg SQ every other week    [provider]  aspirin 81 MG tablet Take 81 mg by mouth daily.      [provider]  brimonidine-timolol (COMBIGAN) 0.2-0.5 % ophthalmic solution Place 1 drop into both eyes every 12 (twelve) hours. Reported on 11/23/2015    [provider]  calcium-vitamin D (OSCAL WITH D) 500-200 MG-UNIT per tablet Take 2 tablets by mouth daily.      [provider]  doxycycline (VIBRA-TABS) 100 MG tablet Take 1 tablet (100 mg total) by mouth 2 (two) times daily. 08/12/18   Robyn Haber, MD  finasteride (PROSCAR) 5 MG tablet Take 1 tablet (5 mg total) by mouth daily. 12/19/17   Laurey Morale, MD  FOLIC ACID PO Take 10 mg by mouth daily.     [provider]  glucose blood test strip Contour test strips check blood sugar 3- 4 times  daily Dx: E11.9 10/03/17   Laurey Morale, MD  lisinopril (PRINIVIL,ZESTRIL) 20 MG tablet TAKE 1/2 TABLET BY MOUTH DAILY 02/26/18   Laurey Morale, MD  metFORMIN (GLUCOPHAGE) 1000 MG tablet TAKE 1 TABLET( 1,000 MG TOTAL) BY MOUTH TWICE DAILY WITH MEALS 01/15/18   Laurey Morale, MD  methotrexate (RHEUMATREX) 10 MG tablet Take 10 mg by mouth once a week. Caution: Chemotherapy. Protect from light.    [provider]  [place] TEST DAILY 05/25/18   Laurey Morale, MD  Multiple Vitamin (MULTIVITAMIN) tablet Take 1 tablet by mouth daily.    [provider]  rosuvastatin (CRESTOR) 20 MG tablet TAKE 1 TABLET(20 MG) BY MOUTH DAILY 11/21/17   Laurey Morale, MD  sulfamethoxazole-trimethoprim (BACTRIM DS,SEPTRA DS) 800-160 MG tablet Take 1 tablet by mouth 2 (two) times daily. 06/12/18   Laurey Morale, MD  zolpidem (AMBIEN) 10 MG tablet TAKE 1 TABLET BY MOUTH EVERY NIGHT AT BEDTIME  07/26/18   Laurey Morale, MD    Family History Family History  Problem Relation Age of Onset  . Heart attack Father 98  . Kidney disease Father   . Heart disease Father   . Colon cancer Father   . Thyroid cancer Mother   . Heart disease Mother   . Heart attack Paternal Grandmother   . Thyroid cancer Brother   . Diabetes Paternal Grandfather   . Stomach cancer Neg Hx     Social History Social History   Tobacco Use  . Smoking status: Former Smoker    Packs/day: 1.00    Years: 8.00    Pack years: 8.00    Types: Cigarettes    Last attempt to quit: 02/06/1970    Years since quitting: 48.5  . Smokeless tobacco: Never Used  Substance Use Topics  . Alcohol use: Yes    Alcohol/week: 7.0 standard drinks    Types: 7 Standard drinks or equivalent per week    Comment: glass of wine most days  . Drug use: No     Allergies   Contrast media [iodinated diagnostic agents]; Red dye; Fluorescein; and Ioxaglate   Review of Systems Review of Systems   Physical Exam Triage Vital Signs ED Triage Vitals  Enc Vitals Group     BP 08/12/18 1026 132/79     Pulse Rate 08/12/18 1026 72     Resp 08/12/18 1026 16     Temp 08/12/18 1026 98.6 F (37 C)     Temp Source 08/12/18 1026 Oral     SpO2 08/12/18 1026 95 %     Weight --      Height --      Head Circumference --      Peak Flow --      Pain Score 08/12/18 1027 4     Pain Loc --      Pain Edu? --      Excl. in [place]? --    No data found.  Updated Vital Signs BP 132/79 (BP Location: Left Arm)   Pulse 72   Temp 98.6 F (37 C) (Oral)   Resp 16   SpO2 95%    Physical Exam  Constitutional: He is oriented to person, place, and time. He appears well-developed and well-nourished.  HENT:  Right Ear: External ear normal.  Left Ear: External ear normal.  Eyes: Conjunctivae are normal.  Neck: Normal range of motion. Neck supple.  Pulmonary/Chest: Effort normal.  Musculoskeletal: Normal range of motion.  Neurological: He is  alert and oriented to person, place, and time.  Skin:  Red distal phalanx of the right great toe.  Nursing note and vitals reviewed.    UC Treatments / Results  Labs (all labs ordered are listed, but only abnormal results are displayed) Labs Reviewed - No data to display  EKG None  Radiology No results found.  Procedures Procedures (including critical care time)  Medications Ordered in UC Medications - No data to display  Initial Impression / Assessment and Plan / UC Course  I have reviewed the triage vital signs and the nursing notes.  Pertinent labs & imaging results that were available during my care of the patient were reviewed by me and considered in my medical decision making (see chart for details).    Final Clinical Impressions(s) / UC Diagnoses   Final diagnoses:  Cellulitis of great toe of right foot   Discharge Instructions   None    ED Prescriptions    Medication Sig Dispense Auth. Provider   doxycycline (VIBRA-TABS) 100 MG tablet Take 1 tablet (100 mg total) by mouth 2 (two) times daily. 20 tablet Robyn Haber, MD     Controlled Substance Prescriptions [place] Controlled Substance Registry consulted? Not Applicable   Robyn Haber, MD 08/12/18 1047

## 2018-08-12 NOTE — ED Triage Notes (Signed)
The patient presented to the [facility] with a complaint of redness and pain to the great toe on his right foot that he "stubbed" 11 days ago.

## 2018-08-13 NOTE — Telephone Encounter (Signed)
Dr. Fry please advise. Thanks  

## 2018-08-14 NOTE — Telephone Encounter (Signed)
If this is still bothering him, have him come in tomorrow so I can take a look at it (Xray if needed)

## 2018-08-14 NOTE — Telephone Encounter (Signed)
Called and spoke with pt and he was seen in the UC and treated for this.  He feels that this is getting better.  Dr. Sarajane Jews is aware.

## 2018-08-17 ENCOUNTER — Ambulatory Visit: Payer: Self-pay

## 2018-08-17 NOTE — Telephone Encounter (Signed)
Pt reports sustained puncture wound 3 weeks ago.  Seen at [facility] 08/12/18, "Cellulitis of right great toe." Pt placed on doxycyline 100 mg BID; on day 5 of 10. Reports redness remains "From joint up and appears to have a pus pocket bottom of nail." States some purulent drainage expressed from left side of nail.  Mild tenderness, able to wear "Soft shoes." States swelling remains but decreased. Denies fever. Pt aware no availability at practice today, offered OV at another practice, declined.  Pt states he will be visiting his nephew today who is a Environmental manager."  States nephew said he will assess. Informed pt [place] would alert Dr. Sarajane Jews. Care advise given per protocol.  Reason for Disposition . [1] Looks infected (red area or pus) AND [2] no fever    Seen 08/12/18 "Cellulitis" day 5 of 10 on doxycyline.  Answer Assessment - Initial Assessment Questions 1. LOCATION: "Where is the puncture located?"      Right great toe  2. OBJECT: "What was the object that punctured the skin?"     Gym equipment. 3. DEPTH: "How deep do you think the puncture goes?"     Treated 08/12/18 at UC 4. ONSET: "When did the injury occur?" (Minutes or hours)    3 weeks ago, little over. 5. PAIN: "Is it painful?" If so, ask: "How bad is the pain?"  (Scale 1-10; or mild, moderate, severe)     mild 6. TETANUS: "When was the last tetanus booster?" 1 month ago per pt report  Protocols used: [place]

## 2018-08-27 ENCOUNTER — Ambulatory Visit (INDEPENDENT_AMBULATORY_CARE_PROVIDER_SITE_OTHER): Payer: BLUE CROSS/BLUE SHIELD | Admitting: Family Medicine

## 2018-08-27 ENCOUNTER — Encounter: Payer: Self-pay | Admitting: Family

## 2018-08-27 ENCOUNTER — Encounter: Payer: Self-pay | Admitting: Family Medicine

## 2018-08-27 ENCOUNTER — Telehealth: Payer: BLUE CROSS/BLUE SHIELD | Admitting: Family

## 2018-08-27 VITALS — BP 136/70 | HR 80 | Temp 98.3°F | Wt 187.5 lb

## 2018-08-27 DIAGNOSIS — J029 Acute pharyngitis, unspecified: Secondary | ICD-10-CM

## 2018-08-27 DIAGNOSIS — R490 Dysphonia: Secondary | ICD-10-CM | POA: Diagnosis not present

## 2018-08-27 MED ORDER — BENZONATATE 100 MG PO CAPS: 100.0000 mg | ORAL_CAPSULE | Freq: Three times a day (TID) | ORAL | 0 refills | Status: DC | PRN

## 2018-08-27 NOTE — Progress Notes (Signed)
Thank you for the details you included in the comment boxes. Those details are very helpful in determining the best course of treatment for you and help [place] to provide the best care. This does not present as flu, but more as a virus. See below.  We are sorry that you are not feeling well.  Here is how we plan to help!  Based on your presentation I believe you most likely have A cough due to a virus.  This is called viral bronchitis and is best treated by rest, plenty of fluids and control of the cough.  You may use Ibuprofen or Tylenol as directed to help your symptoms.     In addition you may use A non-prescription cough medication called Mucinex DM: take 2 tablets every 12 hours. and A prescription cough medication called Tessalon Perles 100mg . You may take 1-2 capsules every 8 hours as needed for your cough.   From your responses in the eVisit questionnaire you describe inflammation in the upper respiratory tract which is causing a significant cough.  This is commonly called Bronchitis and has four common causes:    Allergies  Viral Infections  Acid Reflux  Bacterial Infection Allergies, viruses and acid reflux are treated by controlling symptoms or eliminating the cause. An example might be a cough caused by taking certain blood pressure medications. You stop the cough by changing the medication. Another example might be a cough caused by acid reflux. Controlling the reflux helps control the cough.  USE OF BRONCHODILATOR ("RESCUE") INHALERS: There is a risk from using your bronchodilator too frequently.  The risk is that over-reliance on a medication which only relaxes the muscles surrounding the breathing tubes can reduce the effectiveness of medications prescribed to reduce swelling and congestion of the tubes themselves.  Although you feel brief relief from the bronchodilator inhaler, your asthma may actually be worsening with the tubes becoming more swollen and filled with mucus.  This  can delay other crucial treatments, such as oral steroid medications. If you need to use a bronchodilator inhaler daily, several times per day, you should discuss this with your provider.  There are probably better treatments that could be used to keep your asthma under control.     HOME CARE . Only take medications as instructed by your medical team. . Complete the entire course of an antibiotic. . Drink plenty of fluids and get plenty of rest. . Avoid close contacts especially the very young and the elderly . Cover your mouth if you cough or cough into your sleeve. . Always remember to wash your hands . A steam or ultrasonic humidifier can help congestion.   GET HELP RIGHT AWAY IF: . You develop worsening fever. . You become short of breath . You cough up blood. . Your symptoms persist after you have completed your treatment plan MAKE SURE YOU   Understand these instructions.  Will watch your condition.  Will get help right away if you are not doing well or get worse.  Your e-visit answers were reviewed by a board certified advanced clinical practitioner to complete your personal care plan.  Depending on the condition, your plan could have included both over the counter or prescription medications. If there is a problem please reply  once you have received a response from your provider. Your safety is important to [place].  If you have drug allergies check your prescription carefully.    You can use MyChart to ask questions about today's visit, request  a non-urgent call back, or ask for a work or school excuse for 24 hours related to this e-Visit. If it has been greater than 24 hours you will need to follow up with your provider, or enter a new e-Visit to address those concerns. You will get an e-mail in the next two days asking about your experience.  I hope that your e-visit has been valuable and will speed your recovery. Thank you for using e-visits.

## 2018-08-27 NOTE — Progress Notes (Signed)
   Subjective:    Patient ID: [name] [name]., male    DOB: 1945/11/03, 72 y.o.   MRN: [id number]  HPI Here for 12 days of hoarseness and an occasional dry cough. No fever. No sinus congestion or PND. This started while he was taking a course of Doxycycline for a toe infection. He felt a lot of heartburn one day and even vomited once.    Review of Systems  Constitutional: Negative.   HENT: Positive for voice change. Negative for congestion, sinus pressure, sinus pain and sore throat.   Eyes: Negative.   Respiratory: Positive for cough.   Cardiovascular: Negative.   Gastrointestinal: Negative.        Objective:   Physical Exam Constitutional:      Appearance: Normal appearance.  HENT:     Right Ear: Tympanic membrane and ear canal normal.     Left Ear: Tympanic membrane and ear canal normal.     Nose: Nose normal.     Mouth/Throat:     Pharynx: Oropharynx is clear.  Eyes:     Conjunctiva/sclera: Conjunctivae normal.  Neck:     Musculoskeletal: Normal range of motion.  Cardiovascular:     Rate and Rhythm: Normal rate and regular rhythm.     Pulses: Normal pulses.     Heart sounds: Normal heart sounds.  Pulmonary:     Effort: Pulmonary effort is normal. No respiratory distress.     Breath sounds: Normal breath sounds. No stridor. No wheezing, rhonchi or rales.  Lymphadenopathy:     Cervical: No cervical adenopathy.  Neurological:     Mental Status: He is alert.           Assessment & Plan:  I think his hoarseness is from GERD, which was likely stirred up by his recent round of Doxycycline. He will take Prilosec OTC bid for 2 weeks or so until he clears.  Alysia Penna, MD

## 2018-09-02 ENCOUNTER — Encounter: Payer: Self-pay | Admitting: Family Medicine

## 2018-09-03 MED ORDER — METHYLPREDNISOLONE 4 MG PO TBPK: ORAL_TABLET | ORAL | 0 refills | Status: DC

## 2018-09-03 NOTE — Telephone Encounter (Signed)
Dr. Fry please advise. Thanks  

## 2018-09-03 NOTE — Telephone Encounter (Signed)
We can try that. Call in a Medrol dose pack.

## 2018-09-21 ENCOUNTER — Encounter: Payer: Self-pay | Admitting: Family Medicine

## 2018-10-02 ENCOUNTER — Other Ambulatory Visit: Payer: Self-pay | Admitting: Family Medicine

## 2018-10-16 DIAGNOSIS — E119 Type 2 diabetes mellitus without complications: Secondary | ICD-10-CM | POA: Diagnosis not present

## 2018-10-16 DIAGNOSIS — H3581 Retinal edema: Secondary | ICD-10-CM | POA: Diagnosis not present

## 2018-10-16 DIAGNOSIS — Z961 Presence of intraocular lens: Secondary | ICD-10-CM | POA: Diagnosis not present

## 2018-10-16 DIAGNOSIS — H30033 Focal chorioretinal inflammation, peripheral, bilateral: Secondary | ICD-10-CM | POA: Diagnosis not present

## 2018-10-16 DIAGNOSIS — Z79899 Other long term (current) drug therapy: Secondary | ICD-10-CM | POA: Diagnosis not present

## 2018-10-16 DIAGNOSIS — H44113 Panuveitis, bilateral: Secondary | ICD-10-CM | POA: Diagnosis not present

## 2018-10-17 DIAGNOSIS — R0989 Other specified symptoms and signs involving the circulatory and respiratory systems: Secondary | ICD-10-CM | POA: Diagnosis not present

## 2018-10-17 DIAGNOSIS — H6522 Chronic serous otitis media, left ear: Secondary | ICD-10-CM | POA: Diagnosis not present

## 2018-10-17 DIAGNOSIS — F458 Other somatoform disorders: Secondary | ICD-10-CM | POA: Diagnosis not present

## 2018-10-17 DIAGNOSIS — H903 Sensorineural hearing loss, bilateral: Secondary | ICD-10-CM | POA: Diagnosis not present

## 2018-10-22 DIAGNOSIS — L821 Other seborrheic keratosis: Secondary | ICD-10-CM | POA: Diagnosis not present

## 2018-10-22 DIAGNOSIS — Z85828 Personal history of other malignant neoplasm of skin: Secondary | ICD-10-CM | POA: Diagnosis not present

## 2018-10-22 DIAGNOSIS — L570 Actinic keratosis: Secondary | ICD-10-CM | POA: Diagnosis not present

## 2018-10-22 DIAGNOSIS — D1801 Hemangioma of skin and subcutaneous tissue: Secondary | ICD-10-CM | POA: Diagnosis not present

## 2018-10-22 DIAGNOSIS — D2271 Melanocytic nevi of right lower limb, including hip: Secondary | ICD-10-CM | POA: Diagnosis not present

## 2018-11-13 DIAGNOSIS — M50321 Other cervical disc degeneration at C4-C5 level: Secondary | ICD-10-CM | POA: Diagnosis not present

## 2018-11-13 DIAGNOSIS — M9901 Segmental and somatic dysfunction of cervical region: Secondary | ICD-10-CM | POA: Diagnosis not present

## 2018-11-13 DIAGNOSIS — M50322 Other cervical disc degeneration at C5-C6 level: Secondary | ICD-10-CM | POA: Diagnosis not present

## 2018-11-13 DIAGNOSIS — M50323 Other cervical disc degeneration at C6-C7 level: Secondary | ICD-10-CM | POA: Diagnosis not present

## 2018-11-27 DIAGNOSIS — Z79899 Other long term (current) drug therapy: Secondary | ICD-10-CM | POA: Diagnosis not present

## 2018-11-27 DIAGNOSIS — H44113 Panuveitis, bilateral: Secondary | ICD-10-CM | POA: Diagnosis not present

## 2018-11-27 DIAGNOSIS — H30033 Focal chorioretinal inflammation, peripheral, bilateral: Secondary | ICD-10-CM | POA: Diagnosis not present

## 2018-11-27 DIAGNOSIS — H3581 Retinal edema: Secondary | ICD-10-CM | POA: Diagnosis not present

## 2018-12-10 DIAGNOSIS — M50323 Other cervical disc degeneration at C6-C7 level: Secondary | ICD-10-CM | POA: Diagnosis not present

## 2018-12-10 DIAGNOSIS — M9901 Segmental and somatic dysfunction of cervical region: Secondary | ICD-10-CM | POA: Diagnosis not present

## 2018-12-10 DIAGNOSIS — M50321 Other cervical disc degeneration at C4-C5 level: Secondary | ICD-10-CM | POA: Diagnosis not present

## 2018-12-10 DIAGNOSIS — M50322 Other cervical disc degeneration at C5-C6 level: Secondary | ICD-10-CM | POA: Diagnosis not present

## 2018-12-12 ENCOUNTER — Other Ambulatory Visit: Payer: Self-pay | Admitting: Family Medicine

## 2018-12-18 DIAGNOSIS — Z79899 Other long term (current) drug therapy: Secondary | ICD-10-CM | POA: Diagnosis not present

## 2018-12-18 DIAGNOSIS — E119 Type 2 diabetes mellitus without complications: Secondary | ICD-10-CM | POA: Diagnosis not present

## 2018-12-18 DIAGNOSIS — H3581 Retinal edema: Secondary | ICD-10-CM | POA: Diagnosis not present

## 2018-12-18 DIAGNOSIS — H30033 Focal chorioretinal inflammation, peripheral, bilateral: Secondary | ICD-10-CM | POA: Diagnosis not present

## 2018-12-26 ENCOUNTER — Telehealth: Payer: Self-pay | Admitting: *Deleted

## 2018-12-26 NOTE — Telephone Encounter (Signed)
Copied from [place] [id number]. Topic: General - Other >> Dec 26, 2018 11:15 AM Carolyn Stare wrote:  Pt left a message on the [facility] appt line saying he was returning a call about an appt he has on 4.21.2020   I have called the pt and LM on the VM to call back.

## 2018-12-26 NOTE — Telephone Encounter (Signed)
This was for a physical. Have him postpone this for 3 months unless there is a problem he wants to address

## 2018-12-27 NOTE — Telephone Encounter (Signed)
Called and spoke with pt and he is aware to call back in a few months to reschedule

## 2018-12-31 ENCOUNTER — Encounter: Payer: Self-pay | Admitting: Family Medicine

## 2019-01-01 ENCOUNTER — Encounter: Payer: BLUE CROSS/BLUE SHIELD | Admitting: Family Medicine

## 2019-01-01 MED ORDER — FINASTERIDE 5 MG PO TABS: 5.0000 mg | ORAL_TABLET | Freq: Every day | ORAL | 3 refills | Status: DC

## 2019-01-01 NOTE — Telephone Encounter (Signed)
Call in Finasteride 5 mg daily, #90 with 3 rf

## 2019-01-01 NOTE — Telephone Encounter (Signed)
Please advise. I do not see this on the patients med list.  

## 2019-01-03 DIAGNOSIS — M50321 Other cervical disc degeneration at C4-C5 level: Secondary | ICD-10-CM | POA: Diagnosis not present

## 2019-01-03 DIAGNOSIS — M50322 Other cervical disc degeneration at C5-C6 level: Secondary | ICD-10-CM | POA: Diagnosis not present

## 2019-01-03 DIAGNOSIS — M50323 Other cervical disc degeneration at C6-C7 level: Secondary | ICD-10-CM | POA: Diagnosis not present

## 2019-01-03 DIAGNOSIS — M9901 Segmental and somatic dysfunction of cervical region: Secondary | ICD-10-CM | POA: Diagnosis not present

## 2019-01-22 DIAGNOSIS — M50322 Other cervical disc degeneration at C5-C6 level: Secondary | ICD-10-CM | POA: Diagnosis not present

## 2019-01-22 DIAGNOSIS — M50323 Other cervical disc degeneration at C6-C7 level: Secondary | ICD-10-CM | POA: Diagnosis not present

## 2019-01-22 DIAGNOSIS — M9901 Segmental and somatic dysfunction of cervical region: Secondary | ICD-10-CM | POA: Diagnosis not present

## 2019-01-22 DIAGNOSIS — M50321 Other cervical disc degeneration at C4-C5 level: Secondary | ICD-10-CM | POA: Diagnosis not present

## 2019-02-06 DIAGNOSIS — Z9622 Myringotomy tube(s) status: Secondary | ICD-10-CM | POA: Diagnosis not present

## 2019-02-06 DIAGNOSIS — H6692 Otitis media, unspecified, left ear: Secondary | ICD-10-CM | POA: Diagnosis not present

## 2019-02-06 DIAGNOSIS — H90A32 Mixed conductive and sensorineural hearing loss, unilateral, left ear with restricted hearing on the contralateral side: Secondary | ICD-10-CM | POA: Diagnosis not present

## 2019-02-06 DIAGNOSIS — H9072 Mixed conductive and sensorineural hearing loss, unilateral, left ear, with unrestricted hearing on the contralateral side: Secondary | ICD-10-CM | POA: Diagnosis not present

## 2019-02-06 DIAGNOSIS — H6983 Other specified disorders of Eustachian tube, bilateral: Secondary | ICD-10-CM | POA: Diagnosis not present

## 2019-02-12 DIAGNOSIS — M50323 Other cervical disc degeneration at C6-C7 level: Secondary | ICD-10-CM | POA: Diagnosis not present

## 2019-02-12 DIAGNOSIS — M9901 Segmental and somatic dysfunction of cervical region: Secondary | ICD-10-CM | POA: Diagnosis not present

## 2019-02-12 DIAGNOSIS — M50321 Other cervical disc degeneration at C4-C5 level: Secondary | ICD-10-CM | POA: Diagnosis not present

## 2019-02-12 DIAGNOSIS — M50322 Other cervical disc degeneration at C5-C6 level: Secondary | ICD-10-CM | POA: Diagnosis not present

## 2019-02-12 DIAGNOSIS — H90A32 Mixed conductive and sensorineural hearing loss, unilateral, left ear with restricted hearing on the contralateral side: Secondary | ICD-10-CM | POA: Diagnosis not present

## 2019-02-12 DIAGNOSIS — H90A21 Sensorineural hearing loss, unilateral, right ear, with restricted hearing on the contralateral side: Secondary | ICD-10-CM | POA: Diagnosis not present

## 2019-02-26 DIAGNOSIS — Z79899 Other long term (current) drug therapy: Secondary | ICD-10-CM | POA: Diagnosis not present

## 2019-02-26 DIAGNOSIS — H30033 Focal chorioretinal inflammation, peripheral, bilateral: Secondary | ICD-10-CM | POA: Diagnosis not present

## 2019-02-26 DIAGNOSIS — H44113 Panuveitis, bilateral: Secondary | ICD-10-CM | POA: Diagnosis not present

## 2019-03-01 DIAGNOSIS — Z1159 Encounter for screening for other viral diseases: Secondary | ICD-10-CM | POA: Diagnosis not present

## 2019-03-03 ENCOUNTER — Other Ambulatory Visit: Payer: Self-pay | Admitting: Family Medicine

## 2019-03-05 DIAGNOSIS — M50322 Other cervical disc degeneration at C5-C6 level: Secondary | ICD-10-CM | POA: Diagnosis not present

## 2019-03-05 DIAGNOSIS — M50321 Other cervical disc degeneration at C4-C5 level: Secondary | ICD-10-CM | POA: Diagnosis not present

## 2019-03-05 DIAGNOSIS — M9901 Segmental and somatic dysfunction of cervical region: Secondary | ICD-10-CM | POA: Diagnosis not present

## 2019-03-05 DIAGNOSIS — M50323 Other cervical disc degeneration at C6-C7 level: Secondary | ICD-10-CM | POA: Diagnosis not present

## 2019-03-10 ENCOUNTER — Other Ambulatory Visit: Payer: Self-pay | Admitting: Family Medicine

## 2019-03-11 NOTE — Telephone Encounter (Signed)
Dr. Sarajane Jews please advise on refill of the zolpidem.  Thanks

## 2019-03-12 DIAGNOSIS — H3581 Retinal edema: Secondary | ICD-10-CM | POA: Diagnosis not present

## 2019-03-12 DIAGNOSIS — E119 Type 2 diabetes mellitus without complications: Secondary | ICD-10-CM | POA: Diagnosis not present

## 2019-03-12 DIAGNOSIS — Z961 Presence of intraocular lens: Secondary | ICD-10-CM | POA: Diagnosis not present

## 2019-03-12 DIAGNOSIS — H30033 Focal chorioretinal inflammation, peripheral, bilateral: Secondary | ICD-10-CM | POA: Diagnosis not present

## 2019-03-12 NOTE — Telephone Encounter (Signed)
The Zolpidem is a duplicate. Call in Zolpidem #90 with one rf, also Lisinopril #90 with 3 rf

## 2019-03-18 ENCOUNTER — Telehealth: Payer: Self-pay | Admitting: Family Medicine

## 2019-03-18 MED ORDER — LISINOPRIL 20 MG PO TABS: 10.0000 mg | ORAL_TABLET | Freq: Every day | ORAL | 3 refills | Status: DC

## 2019-03-18 MED ORDER — MICROLET LANCETS MISC: 3 refills | Status: DC

## 2019-03-18 MED ORDER — GLUCOSE BLOOD VI STRP: ORAL_STRIP | 3 refills | Status: DC

## 2019-03-18 MED ORDER — ZOLPIDEM TARTRATE 10 MG PO TABS: 10.0000 mg | ORAL_TABLET | Freq: Every day | ORAL | 1 refills | Status: DC

## 2019-03-18 NOTE — Telephone Encounter (Signed)
All these were sent in electronically. Please contact him and remind him that the best way to communicate with me is by emails through My Chart, not by telephone calls

## 2019-03-19 ENCOUNTER — Encounter: Payer: Self-pay | Admitting: *Deleted

## 2019-04-09 DIAGNOSIS — M50321 Other cervical disc degeneration at C4-C5 level: Secondary | ICD-10-CM | POA: Diagnosis not present

## 2019-04-09 DIAGNOSIS — M9901 Segmental and somatic dysfunction of cervical region: Secondary | ICD-10-CM | POA: Diagnosis not present

## 2019-04-09 DIAGNOSIS — M50323 Other cervical disc degeneration at C6-C7 level: Secondary | ICD-10-CM | POA: Diagnosis not present

## 2019-04-09 DIAGNOSIS — M50322 Other cervical disc degeneration at C5-C6 level: Secondary | ICD-10-CM | POA: Diagnosis not present

## 2019-04-25 DIAGNOSIS — L570 Actinic keratosis: Secondary | ICD-10-CM | POA: Diagnosis not present

## 2019-04-29 ENCOUNTER — Ambulatory Visit (INDEPENDENT_AMBULATORY_CARE_PROVIDER_SITE_OTHER): Payer: BC Managed Care – PPO | Admitting: Family Medicine

## 2019-04-29 ENCOUNTER — Encounter: Payer: Self-pay | Admitting: Family Medicine

## 2019-04-29 VITALS — BP 124/68 | HR 68 | Temp 97.6°F | Wt 187.8 lb

## 2019-04-29 DIAGNOSIS — Z0000 Encounter for general adult medical examination without abnormal findings: Secondary | ICD-10-CM

## 2019-04-29 DIAGNOSIS — E119 Type 2 diabetes mellitus without complications: Secondary | ICD-10-CM | POA: Diagnosis not present

## 2019-04-29 LAB — HEMOGLOBIN A1C: Hgb A1c MFr Bld: 7.1 % — ABNORMAL HIGH (ref 4.6–6.5)

## 2019-04-29 LAB — CBC WITH DIFFERENTIAL/PLATELET
Basophils Absolute: 0 10*3/uL (ref 0.0–0.1)
Basophils Relative: 0.7 % (ref 0.0–3.0)
Eosinophils Absolute: 0.2 10*3/uL (ref 0.0–0.7)
Eosinophils Relative: 3.2 % (ref 0.0–5.0)
HCT: 43.3 % (ref 39.0–52.0)
Hemoglobin: 15 g/dL (ref 13.0–17.0)
Lymphocytes Relative: 21.8 % (ref 12.0–46.0)
Lymphs Abs: 1.3 10*3/uL (ref 0.7–4.0)
MCHC: 34.6 g/dL (ref 30.0–36.0)
MCV: 109.5 fl — ABNORMAL HIGH (ref 78.0–100.0)
Monocytes Absolute: 0.5 10*3/uL (ref 0.1–1.0)
Monocytes Relative: 9 % (ref 3.0–12.0)
Neutro Abs: 3.9 10*3/uL (ref 1.4–7.7)
Neutrophils Relative %: 65.3 % (ref 43.0–77.0)
Platelets: 163 10*3/uL (ref 150.0–400.0)
RBC: 3.95 Mil/uL — ABNORMAL LOW (ref 4.22–5.81)
RDW: 12.9 % (ref 11.5–15.5)
WBC: 6 10*3/uL (ref 4.0–10.5)

## 2019-04-29 LAB — BASIC METABOLIC PANEL
BUN: 28 mg/dL — ABNORMAL HIGH (ref 6–23)
CO2: 28 mEq/L (ref 19–32)
Calcium: 9.4 mg/dL (ref 8.4–10.5)
Chloride: 99 mEq/L (ref 96–112)
Creatinine, Ser: 1.2 mg/dL (ref 0.40–1.50)
GFR: 59.2 mL/min — ABNORMAL LOW (ref >60.00)
Glucose, Bld: 201 mg/dL — ABNORMAL HIGH (ref 70–99)
Potassium: 4.9 mEq/L (ref 3.5–5.1)
Sodium: 134 mEq/L — ABNORMAL LOW (ref 135–145)

## 2019-04-29 LAB — TSH: TSH: 1.22 u[IU]/mL (ref 0.35–4.50)

## 2019-04-29 LAB — POC URINALSYSI DIPSTICK (AUTOMATED)
Bilirubin, UA: NEGATIVE
Blood, UA: NEGATIVE
Glucose, UA: NEGATIVE
Ketones, UA: NEGATIVE
Leukocytes, UA: NEGATIVE
Nitrite, UA: NEGATIVE
Protein, UA: POSITIVE — AB
Spec Grav, UA: 1.02 (ref 1.010–1.025)
Urobilinogen, UA: 0.2 E.U./dL
pH, UA: 6 (ref 5.0–8.0)

## 2019-04-29 LAB — HEPATIC FUNCTION PANEL
ALT: 26 U/L (ref 0–53)
AST: 18 U/L (ref 0–37)
Albumin: 4.6 g/dL (ref 3.5–5.2)
Alkaline Phosphatase: 49 U/L (ref 39–117)
Bilirubin, Direct: 0.2 mg/dL (ref 0.0–0.3)
Total Bilirubin: 1.1 mg/dL (ref 0.2–1.2)
Total Protein: 6.6 g/dL (ref 6.0–8.3)

## 2019-04-29 LAB — LIPID PANEL
Cholesterol: 137 mg/dL (ref 0–200)
HDL: 40.7 mg/dL (ref >39.00)
LDL Cholesterol: 59 mg/dL (ref 0–99)
NonHDL: 96.04
Total CHOL/HDL Ratio: 3
Triglycerides: 184 mg/dL — ABNORMAL HIGH (ref 0.0–149.0)
VLDL: 36.8 mg/dL (ref 0.0–40.0)

## 2019-04-29 LAB — PSA: PSA: 1.35 ng/mL (ref 0.10–4.00)

## 2019-04-29 NOTE — Progress Notes (Signed)
   Subjective:    Patient ID: [name] [name]., male    DOB: November 07, 1945, 73 y.o.   MRN: [id number]  HPI Here for a well exam. He feels fine.    Review of Systems  Constitutional: Negative.   HENT: Negative.   Eyes: Negative.   Respiratory: Negative.   Cardiovascular: Negative.   Gastrointestinal: Negative.   Genitourinary: Negative.   Musculoskeletal: Negative.   Skin: Negative.   Neurological: Negative.   Psychiatric/Behavioral: Negative.        Objective:   Physical Exam Constitutional:      General: He is not in acute distress.    Appearance: He is well-developed. He is not diaphoretic.  HENT:     Head: Normocephalic and atraumatic.     Right Ear: External ear normal.     Left Ear: External ear normal.     Nose: Nose normal.     Mouth/Throat:     Pharynx: No oropharyngeal exudate.  Eyes:     General: No scleral icterus.       Right eye: No discharge.        Left eye: No discharge.     Conjunctiva/sclera: Conjunctivae normal.     Pupils: Pupils are equal, round, and reactive to light.  Neck:     Musculoskeletal: Neck supple.     Thyroid: No thyromegaly.     Vascular: No JVD.     Trachea: No tracheal deviation.  Cardiovascular:     Rate and Rhythm: Normal rate and regular rhythm.     Heart sounds: Normal heart sounds. No murmur. No friction rub. No gallop.   Pulmonary:     Effort: Pulmonary effort is normal. No respiratory distress.     Breath sounds: Normal breath sounds. No wheezing or rales.  Chest:     Chest wall: No tenderness.  Abdominal:     General: Bowel sounds are normal. There is no distension.     Palpations: Abdomen is soft. There is no mass.     Tenderness: There is no abdominal tenderness. There is no guarding or rebound.  Genitourinary:    Penis: Normal. No tenderness.      Scrotum/Testes: Normal.     Prostate: Normal.     Rectum: Normal. Guaiac result negative.  Musculoskeletal: Normal range of motion.        General: No  tenderness.  Lymphadenopathy:     Cervical: No cervical adenopathy.  Skin:    General: Skin is warm and dry.     Coloration: Skin is not pale.     Findings: No erythema or rash.  Neurological:     Mental Status: He is alert and oriented to person, place, and time.     Cranial Nerves: No cranial nerve deficit.     Motor: No abnormal muscle tone.     Coordination: Coordination normal.     Deep Tendon Reflexes: Reflexes are normal and symmetric. Reflexes normal.  Psychiatric:        Behavior: Behavior normal.        Thought Content: Thought content normal.        Judgment: Judgment normal.           Assessment & Plan:  Well exam. We discussed diet and exercise. Get fasting labs.  Alysia Penna, MD

## 2019-04-30 DIAGNOSIS — M50322 Other cervical disc degeneration at C5-C6 level: Secondary | ICD-10-CM | POA: Diagnosis not present

## 2019-04-30 DIAGNOSIS — M50323 Other cervical disc degeneration at C6-C7 level: Secondary | ICD-10-CM | POA: Diagnosis not present

## 2019-04-30 DIAGNOSIS — M9901 Segmental and somatic dysfunction of cervical region: Secondary | ICD-10-CM | POA: Diagnosis not present

## 2019-04-30 DIAGNOSIS — M50321 Other cervical disc degeneration at C4-C5 level: Secondary | ICD-10-CM | POA: Diagnosis not present

## 2019-05-01 DIAGNOSIS — H90A32 Mixed conductive and sensorineural hearing loss, unilateral, left ear with restricted hearing on the contralateral side: Secondary | ICD-10-CM | POA: Diagnosis not present

## 2019-05-23 DIAGNOSIS — M9901 Segmental and somatic dysfunction of cervical region: Secondary | ICD-10-CM | POA: Diagnosis not present

## 2019-05-23 DIAGNOSIS — M50322 Other cervical disc degeneration at C5-C6 level: Secondary | ICD-10-CM | POA: Diagnosis not present

## 2019-05-23 DIAGNOSIS — M50321 Other cervical disc degeneration at C4-C5 level: Secondary | ICD-10-CM | POA: Diagnosis not present

## 2019-05-23 DIAGNOSIS — M50323 Other cervical disc degeneration at C6-C7 level: Secondary | ICD-10-CM | POA: Diagnosis not present

## 2019-05-27 DIAGNOSIS — H44113 Panuveitis, bilateral: Secondary | ICD-10-CM | POA: Diagnosis not present

## 2019-05-27 DIAGNOSIS — H30033 Focal chorioretinal inflammation, peripheral, bilateral: Secondary | ICD-10-CM | POA: Diagnosis not present

## 2019-05-27 DIAGNOSIS — Z79899 Other long term (current) drug therapy: Secondary | ICD-10-CM | POA: Diagnosis not present

## 2019-05-31 DIAGNOSIS — H1711 Central corneal opacity, right eye: Secondary | ICD-10-CM | POA: Diagnosis not present

## 2019-05-31 DIAGNOSIS — H401123 Primary open-angle glaucoma, left eye, severe stage: Secondary | ICD-10-CM | POA: Diagnosis not present

## 2019-05-31 DIAGNOSIS — H401112 Primary open-angle glaucoma, right eye, moderate stage: Secondary | ICD-10-CM | POA: Diagnosis not present

## 2019-05-31 DIAGNOSIS — Z961 Presence of intraocular lens: Secondary | ICD-10-CM | POA: Diagnosis not present

## 2019-06-11 DIAGNOSIS — E119 Type 2 diabetes mellitus without complications: Secondary | ICD-10-CM | POA: Diagnosis not present

## 2019-06-11 DIAGNOSIS — H30033 Focal chorioretinal inflammation, peripheral, bilateral: Secondary | ICD-10-CM | POA: Diagnosis not present

## 2019-06-11 DIAGNOSIS — H3581 Retinal edema: Secondary | ICD-10-CM | POA: Diagnosis not present

## 2019-06-11 DIAGNOSIS — Z961 Presence of intraocular lens: Secondary | ICD-10-CM | POA: Diagnosis not present

## 2019-06-25 DIAGNOSIS — M50321 Other cervical disc degeneration at C4-C5 level: Secondary | ICD-10-CM | POA: Diagnosis not present

## 2019-06-25 DIAGNOSIS — M50322 Other cervical disc degeneration at C5-C6 level: Secondary | ICD-10-CM | POA: Diagnosis not present

## 2019-06-25 DIAGNOSIS — M9901 Segmental and somatic dysfunction of cervical region: Secondary | ICD-10-CM | POA: Diagnosis not present

## 2019-06-25 DIAGNOSIS — M50323 Other cervical disc degeneration at C6-C7 level: Secondary | ICD-10-CM | POA: Diagnosis not present

## 2019-07-09 DIAGNOSIS — L570 Actinic keratosis: Secondary | ICD-10-CM | POA: Diagnosis not present

## 2019-07-15 DIAGNOSIS — M5137 Other intervertebral disc degeneration, lumbosacral region: Secondary | ICD-10-CM | POA: Diagnosis not present

## 2019-07-15 DIAGNOSIS — M50323 Other cervical disc degeneration at C6-C7 level: Secondary | ICD-10-CM | POA: Diagnosis not present

## 2019-07-15 DIAGNOSIS — M9903 Segmental and somatic dysfunction of lumbar region: Secondary | ICD-10-CM | POA: Diagnosis not present

## 2019-07-15 DIAGNOSIS — M50321 Other cervical disc degeneration at C4-C5 level: Secondary | ICD-10-CM | POA: Diagnosis not present

## 2019-07-16 DIAGNOSIS — M50323 Other cervical disc degeneration at C6-C7 level: Secondary | ICD-10-CM | POA: Diagnosis not present

## 2019-07-16 DIAGNOSIS — M50321 Other cervical disc degeneration at C4-C5 level: Secondary | ICD-10-CM | POA: Diagnosis not present

## 2019-07-16 DIAGNOSIS — M9903 Segmental and somatic dysfunction of lumbar region: Secondary | ICD-10-CM | POA: Diagnosis not present

## 2019-07-16 DIAGNOSIS — M5137 Other intervertebral disc degeneration, lumbosacral region: Secondary | ICD-10-CM | POA: Diagnosis not present

## 2019-07-18 DIAGNOSIS — M5137 Other intervertebral disc degeneration, lumbosacral region: Secondary | ICD-10-CM | POA: Diagnosis not present

## 2019-07-18 DIAGNOSIS — M50321 Other cervical disc degeneration at C4-C5 level: Secondary | ICD-10-CM | POA: Diagnosis not present

## 2019-07-18 DIAGNOSIS — M50323 Other cervical disc degeneration at C6-C7 level: Secondary | ICD-10-CM | POA: Diagnosis not present

## 2019-07-18 DIAGNOSIS — M9903 Segmental and somatic dysfunction of lumbar region: Secondary | ICD-10-CM | POA: Diagnosis not present

## 2019-07-23 DIAGNOSIS — M50321 Other cervical disc degeneration at C4-C5 level: Secondary | ICD-10-CM | POA: Diagnosis not present

## 2019-07-23 DIAGNOSIS — M9903 Segmental and somatic dysfunction of lumbar region: Secondary | ICD-10-CM | POA: Diagnosis not present

## 2019-07-23 DIAGNOSIS — M5137 Other intervertebral disc degeneration, lumbosacral region: Secondary | ICD-10-CM | POA: Diagnosis not present

## 2019-07-23 DIAGNOSIS — M50323 Other cervical disc degeneration at C6-C7 level: Secondary | ICD-10-CM | POA: Diagnosis not present

## 2019-07-26 ENCOUNTER — Other Ambulatory Visit: Payer: Self-pay | Admitting: Family Medicine

## 2019-07-30 DIAGNOSIS — M50321 Other cervical disc degeneration at C4-C5 level: Secondary | ICD-10-CM | POA: Diagnosis not present

## 2019-07-30 DIAGNOSIS — M50323 Other cervical disc degeneration at C6-C7 level: Secondary | ICD-10-CM | POA: Diagnosis not present

## 2019-07-30 DIAGNOSIS — M5137 Other intervertebral disc degeneration, lumbosacral region: Secondary | ICD-10-CM | POA: Diagnosis not present

## 2019-07-30 DIAGNOSIS — M9903 Segmental and somatic dysfunction of lumbar region: Secondary | ICD-10-CM | POA: Diagnosis not present

## 2019-08-20 DIAGNOSIS — M9903 Segmental and somatic dysfunction of lumbar region: Secondary | ICD-10-CM | POA: Diagnosis not present

## 2019-08-20 DIAGNOSIS — M5137 Other intervertebral disc degeneration, lumbosacral region: Secondary | ICD-10-CM | POA: Diagnosis not present

## 2019-08-20 DIAGNOSIS — M50321 Other cervical disc degeneration at C4-C5 level: Secondary | ICD-10-CM | POA: Diagnosis not present

## 2019-08-20 DIAGNOSIS — M50323 Other cervical disc degeneration at C6-C7 level: Secondary | ICD-10-CM | POA: Diagnosis not present

## 2019-08-21 DIAGNOSIS — H26491 Other secondary cataract, right eye: Secondary | ICD-10-CM | POA: Diagnosis not present

## 2019-08-30 DIAGNOSIS — H44113 Panuveitis, bilateral: Secondary | ICD-10-CM | POA: Diagnosis not present

## 2019-08-30 DIAGNOSIS — H30033 Focal chorioretinal inflammation, peripheral, bilateral: Secondary | ICD-10-CM | POA: Diagnosis not present

## 2019-09-03 ENCOUNTER — Ambulatory Visit: Payer: BC Managed Care – PPO | Attending: Internal Medicine

## 2019-09-03 DIAGNOSIS — Z20828 Contact with and (suspected) exposure to other viral communicable diseases: Secondary | ICD-10-CM | POA: Diagnosis not present

## 2019-09-03 DIAGNOSIS — Z20822 Contact with and (suspected) exposure to covid-19: Secondary | ICD-10-CM

## 2019-09-04 LAB — NOVEL CORONAVIRUS, NAA: SARS-CoV-2, NAA: NOT DETECTED

## 2019-09-09 DIAGNOSIS — L570 Actinic keratosis: Secondary | ICD-10-CM | POA: Diagnosis not present

## 2019-09-10 DIAGNOSIS — M9903 Segmental and somatic dysfunction of lumbar region: Secondary | ICD-10-CM | POA: Diagnosis not present

## 2019-09-10 DIAGNOSIS — M50321 Other cervical disc degeneration at C4-C5 level: Secondary | ICD-10-CM | POA: Diagnosis not present

## 2019-09-10 DIAGNOSIS — M5137 Other intervertebral disc degeneration, lumbosacral region: Secondary | ICD-10-CM | POA: Diagnosis not present

## 2019-09-10 DIAGNOSIS — M50323 Other cervical disc degeneration at C6-C7 level: Secondary | ICD-10-CM | POA: Diagnosis not present

## 2019-09-16 ENCOUNTER — Encounter: Payer: Self-pay | Admitting: Family Medicine

## 2019-09-17 DIAGNOSIS — Z961 Presence of intraocular lens: Secondary | ICD-10-CM | POA: Diagnosis not present

## 2019-09-17 DIAGNOSIS — H3581 Retinal edema: Secondary | ICD-10-CM | POA: Diagnosis not present

## 2019-09-17 DIAGNOSIS — H30033 Focal chorioretinal inflammation, peripheral, bilateral: Secondary | ICD-10-CM | POA: Diagnosis not present

## 2019-09-17 DIAGNOSIS — E119 Type 2 diabetes mellitus without complications: Secondary | ICD-10-CM | POA: Diagnosis not present

## 2019-09-25 ENCOUNTER — Other Ambulatory Visit: Payer: Self-pay | Admitting: Family Medicine

## 2019-09-25 NOTE — Telephone Encounter (Signed)
Last filled 03/18/2019 Last OV 04/29/2019  Ok to fill?

## 2019-10-02 DIAGNOSIS — M50321 Other cervical disc degeneration at C4-C5 level: Secondary | ICD-10-CM | POA: Diagnosis not present

## 2019-10-02 DIAGNOSIS — M5137 Other intervertebral disc degeneration, lumbosacral region: Secondary | ICD-10-CM | POA: Diagnosis not present

## 2019-10-02 DIAGNOSIS — M50323 Other cervical disc degeneration at C6-C7 level: Secondary | ICD-10-CM | POA: Diagnosis not present

## 2019-10-02 DIAGNOSIS — M9903 Segmental and somatic dysfunction of lumbar region: Secondary | ICD-10-CM | POA: Diagnosis not present

## 2019-10-07 ENCOUNTER — Other Ambulatory Visit: Payer: Self-pay | Admitting: Family Medicine

## 2019-10-22 DIAGNOSIS — H401112 Primary open-angle glaucoma, right eye, moderate stage: Secondary | ICD-10-CM | POA: Diagnosis not present

## 2019-10-22 DIAGNOSIS — H20021 Recurrent acute iridocyclitis, right eye: Secondary | ICD-10-CM | POA: Diagnosis not present

## 2019-10-22 DIAGNOSIS — H1711 Central corneal opacity, right eye: Secondary | ICD-10-CM | POA: Diagnosis not present

## 2019-10-22 DIAGNOSIS — H401123 Primary open-angle glaucoma, left eye, severe stage: Secondary | ICD-10-CM | POA: Diagnosis not present

## 2019-10-23 DIAGNOSIS — M5137 Other intervertebral disc degeneration, lumbosacral region: Secondary | ICD-10-CM | POA: Diagnosis not present

## 2019-10-23 DIAGNOSIS — M50323 Other cervical disc degeneration at C6-C7 level: Secondary | ICD-10-CM | POA: Diagnosis not present

## 2019-10-23 DIAGNOSIS — M50321 Other cervical disc degeneration at C4-C5 level: Secondary | ICD-10-CM | POA: Diagnosis not present

## 2019-10-23 DIAGNOSIS — M9903 Segmental and somatic dysfunction of lumbar region: Secondary | ICD-10-CM | POA: Diagnosis not present

## 2019-10-29 DIAGNOSIS — Z85828 Personal history of other malignant neoplasm of skin: Secondary | ICD-10-CM | POA: Diagnosis not present

## 2019-10-29 DIAGNOSIS — D1801 Hemangioma of skin and subcutaneous tissue: Secondary | ICD-10-CM | POA: Diagnosis not present

## 2019-10-29 DIAGNOSIS — L812 Freckles: Secondary | ICD-10-CM | POA: Diagnosis not present

## 2019-10-29 DIAGNOSIS — L570 Actinic keratosis: Secondary | ICD-10-CM | POA: Diagnosis not present

## 2019-10-29 DIAGNOSIS — L821 Other seborrheic keratosis: Secondary | ICD-10-CM | POA: Diagnosis not present

## 2019-11-04 ENCOUNTER — Ambulatory Visit: Payer: BC Managed Care – PPO

## 2019-11-06 ENCOUNTER — Ambulatory Visit: Payer: BC Managed Care – PPO | Attending: Internal Medicine

## 2019-11-06 DIAGNOSIS — M9903 Segmental and somatic dysfunction of lumbar region: Secondary | ICD-10-CM | POA: Diagnosis not present

## 2019-11-06 DIAGNOSIS — Z20822 Contact with and (suspected) exposure to covid-19: Secondary | ICD-10-CM | POA: Diagnosis not present

## 2019-11-06 DIAGNOSIS — M5137 Other intervertebral disc degeneration, lumbosacral region: Secondary | ICD-10-CM | POA: Diagnosis not present

## 2019-11-06 DIAGNOSIS — M50323 Other cervical disc degeneration at C6-C7 level: Secondary | ICD-10-CM | POA: Diagnosis not present

## 2019-11-06 DIAGNOSIS — M50321 Other cervical disc degeneration at C4-C5 level: Secondary | ICD-10-CM | POA: Diagnosis not present

## 2019-11-07 ENCOUNTER — Ambulatory Visit: Payer: BC Managed Care – PPO

## 2019-11-07 LAB — NOVEL CORONAVIRUS, NAA: SARS-CoV-2, NAA: NOT DETECTED

## 2019-11-22 ENCOUNTER — Other Ambulatory Visit: Payer: Self-pay | Admitting: Family Medicine

## 2019-11-27 DIAGNOSIS — M9903 Segmental and somatic dysfunction of lumbar region: Secondary | ICD-10-CM | POA: Diagnosis not present

## 2019-11-27 DIAGNOSIS — M50321 Other cervical disc degeneration at C4-C5 level: Secondary | ICD-10-CM | POA: Diagnosis not present

## 2019-11-27 DIAGNOSIS — M50323 Other cervical disc degeneration at C6-C7 level: Secondary | ICD-10-CM | POA: Diagnosis not present

## 2019-11-27 DIAGNOSIS — M5137 Other intervertebral disc degeneration, lumbosacral region: Secondary | ICD-10-CM | POA: Diagnosis not present

## 2019-12-04 DIAGNOSIS — H30033 Focal chorioretinal inflammation, peripheral, bilateral: Secondary | ICD-10-CM | POA: Diagnosis not present

## 2019-12-04 DIAGNOSIS — H44113 Panuveitis, bilateral: Secondary | ICD-10-CM | POA: Diagnosis not present

## 2019-12-04 DIAGNOSIS — Z79899 Other long term (current) drug therapy: Secondary | ICD-10-CM | POA: Diagnosis not present

## 2019-12-18 DIAGNOSIS — M50321 Other cervical disc degeneration at C4-C5 level: Secondary | ICD-10-CM | POA: Diagnosis not present

## 2019-12-18 DIAGNOSIS — M9903 Segmental and somatic dysfunction of lumbar region: Secondary | ICD-10-CM | POA: Diagnosis not present

## 2019-12-18 DIAGNOSIS — M5137 Other intervertebral disc degeneration, lumbosacral region: Secondary | ICD-10-CM | POA: Diagnosis not present

## 2019-12-18 DIAGNOSIS — M50323 Other cervical disc degeneration at C6-C7 level: Secondary | ICD-10-CM | POA: Diagnosis not present

## 2020-01-07 DIAGNOSIS — Z961 Presence of intraocular lens: Secondary | ICD-10-CM | POA: Diagnosis not present

## 2020-01-07 DIAGNOSIS — H3581 Retinal edema: Secondary | ICD-10-CM | POA: Diagnosis not present

## 2020-01-07 DIAGNOSIS — E119 Type 2 diabetes mellitus without complications: Secondary | ICD-10-CM | POA: Diagnosis not present

## 2020-01-07 DIAGNOSIS — H30033 Focal chorioretinal inflammation, peripheral, bilateral: Secondary | ICD-10-CM | POA: Diagnosis not present

## 2020-01-15 DIAGNOSIS — M50323 Other cervical disc degeneration at C6-C7 level: Secondary | ICD-10-CM | POA: Diagnosis not present

## 2020-01-15 DIAGNOSIS — M9903 Segmental and somatic dysfunction of lumbar region: Secondary | ICD-10-CM | POA: Diagnosis not present

## 2020-01-15 DIAGNOSIS — M50321 Other cervical disc degeneration at C4-C5 level: Secondary | ICD-10-CM | POA: Diagnosis not present

## 2020-01-15 DIAGNOSIS — M5137 Other intervertebral disc degeneration, lumbosacral region: Secondary | ICD-10-CM | POA: Diagnosis not present

## 2020-01-18 ENCOUNTER — Other Ambulatory Visit: Payer: Self-pay | Admitting: Family Medicine

## 2020-01-23 ENCOUNTER — Telehealth: Payer: Self-pay | Admitting: Cardiovascular Disease

## 2020-01-23 NOTE — Telephone Encounter (Signed)
New Message   Received request to switch patient from Dr. Angelena Form to Dr. Acie Fredrickson. Just obtaining proper documentation.

## 2020-01-23 NOTE — Telephone Encounter (Signed)
OK with me Chris  

## 2020-02-03 ENCOUNTER — Encounter: Payer: Self-pay | Admitting: Cardiovascular Disease

## 2020-02-03 NOTE — Progress Notes (Signed)
Cardiology Office Note:    Date:  02/04/2020   ID:  [name] [name]., DOB 11-17-1945, MRN [id number]  PCP:  Laurey Morale, MD  Cardiologist:  Palma Buster  Electrophysiologist:  None   Referring MD: Laurey Morale, MD   Chief Complaint  Patient presents with  . Palpitations    History of Present Illness:    [name] [name]. is a 74 y.o. male with a hx of HTN, hyperlipidemia, palpitations He has a strong family hx of atrial fib ( father and sister )   The palptations seem to be worse with lack of sleep and before he eats .  Feels like his heart stops for a second or so ,  Then might have another episode less than a minute later.   No syncope or near syncope  Walks regularly ,  3-4 times a week .  2 miles at a time .  Feels well with walking .   BP is well controlled.   Past Medical History:  Diagnosis Date  . Cataract    Bil/ right eye surgery  . Diabetes mellitus    type II  . Diverticulitis of colon   . ED (erectile dysfunction)   . Gout    no meds  . H/O echocardiogram 2013   normal   . History of irregular heartbeat    Per pt, heart skips beats at times/had Echo  . Hyperlipidemia   . Hypertension   . Iritis, recurrent     sees Dr. Silvestre Gunner at [facility]   . Normal cardiac stress test 09/27/2002  . Palpitations    PVCs and PACs per event monitor  . Post-operative nausea and vomiting   . Uveitis    sees Dr Polly Cobia at [facility] Dr Manuella Ghazi now    Past Surgical History:  Procedure Laterality Date  . APPENDECTOMY    . CATARACT EXTRACTION     Dr Katy Fitch , left eye/ 2 years ago  . CATARACT EXTRACTION  03/16/2017   Right eye  . COLONOSCOPY  04/20/2017   per Dr. Loletha Carrow, adenomatous polyps, repeat in 5 yrs   . SHOULDER ARTHROSCOPY     both shoulders   . TONSILLECTOMY    . TONSILLECTOMY AND ADENOIDECTOMY    . tubes in ears     55 years ago    Current Medications: Current Meds  Medication Sig  . Adalimumab (HUMIRA) 40 MG/0.4ML PSKT Inject into the skin.  Humira 40 mg SQ every other week  . aspirin 81 MG tablet Take 81 mg by mouth daily.    . benzonatate (TESSALON PERLES) 100 MG capsule Take 1-2 capsules (100-200 mg total) by mouth every 8 (eight) hours as needed for cough.  . brimonidine (ALPHAGAN) 0.2 % ophthalmic solution Place 1 drop into both eyes as needed. Used 1 or 2 a year  . brimonidine-timolol (COMBIGAN) 0.2-0.5 % ophthalmic solution Place 1 drop into both eyes every 12 (twelve) hours. Reported on 11/23/2015  . calcium-vitamin D (OSCAL WITH D) 500-200 MG-UNIT per tablet Take 2 tablets by mouth daily.    . finasteride (PROSCAR) 5 MG tablet TAKE 1 TABLET(5 MG) BY MOUTH DAILY  . FOLIC ACID PO Take 10 mg by mouth daily.   Marland Kitchen glucose blood test strip Contour test strips check blood sugar 3- 4 times daily Dx: E11.9  . lisinopril (ZESTRIL) 20 MG tablet Take 0.5 tablets (10 mg total) by mouth daily.  . metFORMIN (GLUCOPHAGE) 1000 MG tablet TAKE 1 TABLET(1000  MG) BY MOUTH TWICE DAILY WITH MEALS  . methotrexate (RHEUMATREX) 2.5 MG tablet Take 4 tablets by mouth once a week.  . Microlet Lancets MISC TEST DAILY  . Multiple Vitamin (MULTIVITAMIN) tablet Take 1 tablet by mouth daily.  . prednisoLONE acetate (PRED FORTE) 1 % ophthalmic suspension Place 1 drop into both eyes 2 (two) times daily.  . rosuvastatin (CRESTOR) 20 MG tablet TAKE ONE TABLET BY MOUTH DAILY  . tamsulosin (FLOMAX) 0.4 MG CAPS capsule Take 0.4 mg by mouth daily.  . vitamin B-12 (CYANOCOBALAMIN) 500 MCG tablet Take 500 mcg by mouth daily.  Marland Kitchen zolpidem (AMBIEN) 10 MG tablet TAKE 1 TABLET(10 MG) BY MOUTH AT BEDTIME  . [DISCONTINUED] methotrexate (RHEUMATREX) 10 MG tablet Take 10 mg by mouth once a week. Caution: Chemotherapy. Protect from light.  . [DISCONTINUED] methylPREDNISolone (MEDROL) 4 MG TBPK tablet Take as directed per package instructions   Current Facility-Administered Medications for the 02/04/20 encounter (Office Visit) with Blayne Frankie, Wonda Cheng, MD  Medication  . 0.9 %   sodium chloride infusion     Allergies:   Contrast media [iodinated diagnostic agents], Red dye, Fluorescein, and Ioxaglate   Social History   Socioeconomic History  . Marital status: Married    Spouse name: Not on file  . Number of children: 1  . Years of education: Not on file  . Highest education level: Not on file  Occupational History  . Occupation: Nurse, learning disability  Tobacco Use  . Smoking status: Former Smoker    Packs/day: 1.00    Years: 8.00    Pack years: 8.00    Types: Cigarettes    Quit date: 02/06/1970    Years since quitting: 50.0  . Smokeless tobacco: Never Used  Substance and Sexual Activity  . Alcohol use: Yes    Alcohol/week: 7.0 standard drinks    Types: 7 Standard drinks or equivalent per week    Comment: glass of wine most days  . Drug use: No  . Sexual activity: Not on file  Other Topics Concern  . Not on file  Social History Narrative  . Not on file   Social Determinants of Health   Financial Resource Strain:   . Difficulty of Paying Living Expenses:   Food Insecurity:   . Worried About Charity fundraiser in the Last Year:   . Arboriculturist in the Last Year:   Transportation Needs:   . Film/video editor (Medical):   Marland Kitchen Lack of Transportation (Non-Medical):   Physical Activity:   . Days of Exercise per Week:   . Minutes of Exercise per Session:   Stress:   . Feeling of Stress :   Social Connections:   . Frequency of Communication with Friends and Family:   . Frequency of Social Gatherings with Friends and Family:   . Attends Religious Services:   . Active Member of Clubs or Organizations:   . Attends Archivist Meetings:   Marland Kitchen Marital Status:      Family History: The patient's family history includes Colon cancer in his father; Diabetes in his paternal grandfather; Heart attack in his paternal grandmother; Heart attack (age of onset: 12) in his father; Heart disease in his father and mother; Kidney disease in his father;  Thyroid cancer in his brother and mother. There is no history of Stomach cancer.  ROS:   Please see the history of present illness.     All other systems reviewed and are negative.  EKGs/Labs/Other  Studies Reviewed:    The following studies were reviewed today:   EKG:   Feb 04, 2020: Normal sinus rhythm at 73.  First-degree AV block.  T wave is fairly wide and somewhat blunted    Recent Labs: 04/29/2019: ALT 26; BUN 28; Creatinine, Ser 1.20; Hemoglobin 15.0; Platelets 163.0; Potassium 4.9; Sodium 134; TSH 1.22  Recent Lipid Panel    Component Value Date/Time   CHOL 137 04/29/2019 0943   TRIG 184.0 (H) 04/29/2019 0943   TRIG 284 (HH) 09/20/2006 0929   HDL 40.70 04/29/2019 0943   CHOLHDL 3 04/29/2019 0943   VLDL 36.8 04/29/2019 0943   LDLCALC 59 04/29/2019 0943   LDLDIRECT 156.1 09/21/2007 0910    Physical Exam:    VS:  BP 136/72   Pulse 72   Ht 6\' 2"  (1.88 m)   Wt 189 lb 12.8 oz (86.1 kg)   SpO2 99%   BMI 24.37 kg/m     Wt Readings from Last 3 Encounters:  02/04/20 189 lb 12.8 oz (86.1 kg)  04/29/19 187 lb 12.8 oz (85.2 kg)  08/27/18 187 lb 8 oz (85 kg)     GEN:  Well nourished, well developed in no acute distress HEENT: Normal NECK: No JVD; No carotid bruits LYMPHATICS: No lymphadenopathy CARDIAC: RRR, no murmurs, rubs, gallops RESPIRATORY:  Clear to auscultation without rales, wheezing or rhonchi  ABDOMEN: Soft, non-tender, non-distended MUSCULOSKELETAL:  No edema; No deformity  SKIN: Warm and dry NEUROLOGIC:  Alert and oriented x 3 PSYCHIATRIC:  Normal affect   ASSESSMENT:    1. Hypertensive heart disease without heart failure   2. Heart block AV first degree   3. Palpitations    PLAN:    In order of problems listed above:  1. Palpitations: Al presents for further evaluation of some palpitations.  He has a strong family history of atrial fibrillation.    I would like to place a 30-day event monitor on him for further evaluation of his A. fib.  We  will also get an echocardiogram.  We had a long discussion about what atrial fibrillation was and what it would mean if we found it.  I discussed the importance of starting anticoagulation.    2.  First-degree AV block: He has very poorly defined P waves and has first-degree AV block.  It is clear that he has some conduction issues.  3.  Hypertensive heart disease without congestive heart failure:  Cont meds Continue to watch his salt intake.    Medication Adjustments/Labs and Tests Ordered: Current medicines are reviewed at length with the patient today.  Concerns regarding medicines are outlined above.  Orders Placed This Encounter  Procedures  . Cardiac event monitor  . EKG 12-Lead  . ECHOCARDIOGRAM COMPLETE   No orders of the defined types were placed in this encounter.      Signed, Mertie Moores, MD  02/04/2020 6:20 PM    [facility]

## 2020-02-04 ENCOUNTER — Other Ambulatory Visit: Payer: Self-pay

## 2020-02-04 ENCOUNTER — Telehealth: Payer: Self-pay | Admitting: Radiology

## 2020-02-04 ENCOUNTER — Encounter: Payer: Self-pay | Admitting: Cardiovascular Disease

## 2020-02-04 ENCOUNTER — Ambulatory Visit (INDEPENDENT_AMBULATORY_CARE_PROVIDER_SITE_OTHER): Payer: BC Managed Care – PPO | Admitting: Cardiovascular Disease

## 2020-02-04 VITALS — BP 136/72 | HR 72 | Ht 74.0 in | Wt 189.8 lb

## 2020-02-04 DIAGNOSIS — R002 Palpitations: Secondary | ICD-10-CM | POA: Diagnosis not present

## 2020-02-04 DIAGNOSIS — I10 Essential (primary) hypertension: Secondary | ICD-10-CM

## 2020-02-04 DIAGNOSIS — I119 Hypertensive heart disease without heart failure: Secondary | ICD-10-CM

## 2020-02-04 DIAGNOSIS — I440 Atrioventricular block, first degree: Secondary | ICD-10-CM

## 2020-02-04 NOTE — Telephone Encounter (Signed)
Enrolled patient for a 30 day [employer] Event monitor to be mailed to patients home.  

## 2020-02-04 NOTE — Patient Instructions (Signed)
Medication Instructions:  Your physician recommends that you continue on your current medications as directed. Please refer to the Current Medication list given to you today.  *If you need a refill on your cardiac medications before your next appointment, please call your pharmacy*   Lab Work: None Ordered If you have labs (blood work) drawn today and your tests are completely normal, you will receive your results only by: Marland Kitchen MyChart Message (if you have MyChart) OR . A paper copy in the mail If you have any lab test that is abnormal or we need to change your treatment, we will call you to review the results.   Testing/Procedures: Your physician has requested that you have an echocardiogram. Echocardiography is a painless test that uses sound waves to create images of your heart. It provides your doctor with information about the size and shape of your heart and how well your heart's chambers and valves are working. This procedure takes approximately one hour. There are no restrictions for this procedure.  Your physician has recommended that you wear an event monitor. Event monitors are medical devices that record the heart's electrical activity. Doctors most often [place] these monitors to diagnose arrhythmias. Arrhythmias are problems with the speed or rhythm of the heartbeat. The monitor is a small, portable device. You can wear one while you do your normal daily activities. This is usually used to diagnose what is causing palpitations/syncope (passing out).   Follow-Up: At [facility], you and your health needs are our priority.  As part of our continuing mission to provide you with exceptional heart care, we have created designated Provider Care Teams.  These Care Teams include your primary Cardiologist (physician) and Advanced Practice Providers (APPs -  Physician Assistants and Nurse Practitioners) who all work together to provide you with the care you need, when you need it.   Your next  appointment:   1 year(s)  The format for your next appointment:   In Person  Provider:   You may see Mertie Moores, MD or one of the following Advanced Practice Providers on your designated Care Team:    Richardson Dopp, PA-C  Robbie Lis, PA-C              [facility] Low Glycemic Diet (Source: [facility], 2006) Low Glycemic Foods (20-49) (Decrease risk of developing heart disease) Breakfast Cereals: All-Bran All-Bran Fruit 'n Oats Fiber One Oatmeal (not instant) Oat bran Fruits and fruit juices: (Limit to 1-2 servings per day) Apples Apricots (fresh & dried) Blackberries Blueberries Cherries Cranberries Peaches Pears Plums Prunes Grapefruit Raspberries Strawberries Tangerine Apple juice Grapefruit juice Tomato juice Beans and legumes (fresh-cooked): Black-eyed peas Butter beans Chick peas Lentils  Green beans Lima beans Kidney beans Navy beans Pinto beans Snow peas Non-starchy vegetables: Asparagus, avocado, broccoli, cabbage, cauliflower, celery, cucumber, greens, lettuce, mushrooms, peppers, tomatoes, okra, onions, spinach, summer squash Grains: Barley Bulgur Rye Wild rice Nuts and oils : Almonds Peanuts Sunflower seeds Hazelnuts Pecans Walnuts Oils that are liquid at room temperature Dairy, fish, meat, soy, and eggs: Milk, skim Lowfat cheese Yogurt, lowfat, fruit sugar sweetened Lean red meat Fish  Skinless chicken & [place] Shellfish Egg whites (up to 3 daily) Soy products  Egg yolks (up to 7 or _____ per week) Moderate Glycemic Foods (50-69) Breakfast Cereals: Bran Buds Bran Chex Just Right Mini-Wheats  Special K Swiss muesli Fruits: Banana (under-ripe) Dates Figs Grapes Kiwi Mango Oranges Raisins Fruit Juices: Cranberry juice Orange juice Beans and  legumes: [place]-type baked beans Canned pinto, kidney, or navy beans Green peas Vegetables: Beets Carrots  Sweet potato Yam Corn on the cob Breads: Pita (pocket)  bread Oat bran bread Pumpernickel bread Rye bread Wheat bread, high fiber  Grains: Cornmeal Rice, brown Rice, white Couscous Pasta: Macaroni Pizza, cheese Ravioli, meat filled Spaghetti, white  Nuts: Cashews Macadamia Snacks: Chocolate Ice cream, lowfat Muffin Popcorn High Glycemic Foods (70-100)  Breakfast Cereals: Cheerios Corn Chex Corn Flakes Cream of Wheat Grape Nuts Grape Nut Flakes Grits Nutri-Grain Puffed Rice Puffed Wheat Rice Chex Rice Krispies Shredded Wheat Team Total Fruits: Pineapple Watermelon Banana (over-ripe) Beverages: Sodas, sweet tea, pineapple juice Vegetables: Potato, baked, boiled, fried, mashed [place] fries Canned or frozen corn Parsnips Winter squash Breads: Most breads (white and whole grain) Bagels Bread sticks Bread stuffing Kaiser roll Dinner rolls Grains: Rice, instant Tapioca, with milk Candy and most cookies Snacks: Donuts Corn chips Jelly beans Pretzels Pastries

## 2020-02-12 DIAGNOSIS — M50321 Other cervical disc degeneration at C4-C5 level: Secondary | ICD-10-CM | POA: Diagnosis not present

## 2020-02-12 DIAGNOSIS — M9903 Segmental and somatic dysfunction of lumbar region: Secondary | ICD-10-CM | POA: Diagnosis not present

## 2020-02-12 DIAGNOSIS — M50323 Other cervical disc degeneration at C6-C7 level: Secondary | ICD-10-CM | POA: Diagnosis not present

## 2020-02-12 DIAGNOSIS — M5137 Other intervertebral disc degeneration, lumbosacral region: Secondary | ICD-10-CM | POA: Diagnosis not present

## 2020-02-15 ENCOUNTER — Encounter (INDEPENDENT_AMBULATORY_CARE_PROVIDER_SITE_OTHER): Payer: BC Managed Care – PPO

## 2020-02-15 DIAGNOSIS — I119 Hypertensive heart disease without heart failure: Secondary | ICD-10-CM

## 2020-02-15 DIAGNOSIS — I440 Atrioventricular block, first degree: Secondary | ICD-10-CM

## 2020-02-15 DIAGNOSIS — R002 Palpitations: Secondary | ICD-10-CM | POA: Diagnosis not present

## 2020-02-19 DIAGNOSIS — H20021 Recurrent acute iridocyclitis, right eye: Secondary | ICD-10-CM | POA: Diagnosis not present

## 2020-02-19 DIAGNOSIS — H1711 Central corneal opacity, right eye: Secondary | ICD-10-CM | POA: Diagnosis not present

## 2020-02-19 DIAGNOSIS — H401112 Primary open-angle glaucoma, right eye, moderate stage: Secondary | ICD-10-CM | POA: Diagnosis not present

## 2020-02-19 DIAGNOSIS — H401123 Primary open-angle glaucoma, left eye, severe stage: Secondary | ICD-10-CM | POA: Diagnosis not present

## 2020-02-26 ENCOUNTER — Ambulatory Visit (HOSPITAL_COMMUNITY): Payer: BC Managed Care – PPO | Attending: Cardiovascular Disease

## 2020-02-26 ENCOUNTER — Other Ambulatory Visit: Payer: Self-pay

## 2020-02-26 DIAGNOSIS — Z79899 Other long term (current) drug therapy: Secondary | ICD-10-CM | POA: Diagnosis not present

## 2020-02-26 DIAGNOSIS — H30033 Focal chorioretinal inflammation, peripheral, bilateral: Secondary | ICD-10-CM | POA: Diagnosis not present

## 2020-02-26 DIAGNOSIS — I119 Hypertensive heart disease without heart failure: Secondary | ICD-10-CM | POA: Diagnosis not present

## 2020-02-26 DIAGNOSIS — I440 Atrioventricular block, first degree: Secondary | ICD-10-CM | POA: Diagnosis not present

## 2020-02-27 ENCOUNTER — Telehealth: Payer: Self-pay | Admitting: Cardiovascular Disease

## 2020-02-27 NOTE — Telephone Encounter (Signed)
New Message  Patient is returning call for echo results. Please give patient a call back.

## 2020-02-27 NOTE — Telephone Encounter (Signed)
Reviewed echo results with patient and questions were answered to his satisfaction. He denies concerns and states he will call back if he needs to see Dr. Acie Fredrickson prior to one year scheduled follow-up. He thanked me for the call.

## 2020-03-09 DIAGNOSIS — L570 Actinic keratosis: Secondary | ICD-10-CM | POA: Diagnosis not present

## 2020-03-14 ENCOUNTER — Other Ambulatory Visit: Payer: Self-pay | Admitting: Family Medicine

## 2020-03-18 NOTE — Telephone Encounter (Signed)
Patient need to schedule an ov for CPE for more refills.

## 2020-03-18 NOTE — Telephone Encounter (Signed)
[place] for another refill until pt schedules appt.?  LOV 04/2019 CPE  Last refill 09/25/2019 QTY 90 R 1

## 2020-03-19 ENCOUNTER — Telehealth: Payer: Self-pay | Admitting: Cardiovascular Disease

## 2020-03-19 NOTE — Telephone Encounter (Signed)
See results note on lab ./cy

## 2020-03-19 NOTE — Telephone Encounter (Signed)
Patient returning call for heart monitor results.

## 2020-03-23 ENCOUNTER — Other Ambulatory Visit: Payer: Self-pay | Admitting: Family Medicine

## 2020-03-25 DIAGNOSIS — M50321 Other cervical disc degeneration at C4-C5 level: Secondary | ICD-10-CM | POA: Diagnosis not present

## 2020-03-25 DIAGNOSIS — M5137 Other intervertebral disc degeneration, lumbosacral region: Secondary | ICD-10-CM | POA: Diagnosis not present

## 2020-03-25 DIAGNOSIS — M9903 Segmental and somatic dysfunction of lumbar region: Secondary | ICD-10-CM | POA: Diagnosis not present

## 2020-03-25 DIAGNOSIS — M50323 Other cervical disc degeneration at C6-C7 level: Secondary | ICD-10-CM | POA: Diagnosis not present

## 2020-04-14 DIAGNOSIS — H30033 Focal chorioretinal inflammation, peripheral, bilateral: Secondary | ICD-10-CM | POA: Diagnosis not present

## 2020-04-14 DIAGNOSIS — H3581 Retinal edema: Secondary | ICD-10-CM | POA: Diagnosis not present

## 2020-04-14 DIAGNOSIS — E119 Type 2 diabetes mellitus without complications: Secondary | ICD-10-CM | POA: Diagnosis not present

## 2020-04-14 DIAGNOSIS — Z961 Presence of intraocular lens: Secondary | ICD-10-CM | POA: Diagnosis not present

## 2020-04-15 DIAGNOSIS — M5137 Other intervertebral disc degeneration, lumbosacral region: Secondary | ICD-10-CM | POA: Diagnosis not present

## 2020-04-15 DIAGNOSIS — M50323 Other cervical disc degeneration at C6-C7 level: Secondary | ICD-10-CM | POA: Diagnosis not present

## 2020-04-15 DIAGNOSIS — M50321 Other cervical disc degeneration at C4-C5 level: Secondary | ICD-10-CM | POA: Diagnosis not present

## 2020-04-15 DIAGNOSIS — M9903 Segmental and somatic dysfunction of lumbar region: Secondary | ICD-10-CM | POA: Diagnosis not present

## 2020-05-06 DIAGNOSIS — L570 Actinic keratosis: Secondary | ICD-10-CM | POA: Diagnosis not present

## 2020-05-06 DIAGNOSIS — D1801 Hemangioma of skin and subcutaneous tissue: Secondary | ICD-10-CM | POA: Diagnosis not present

## 2020-05-06 DIAGNOSIS — Z85828 Personal history of other malignant neoplasm of skin: Secondary | ICD-10-CM | POA: Diagnosis not present

## 2020-05-06 DIAGNOSIS — L821 Other seborrheic keratosis: Secondary | ICD-10-CM | POA: Diagnosis not present

## 2020-05-06 DIAGNOSIS — L812 Freckles: Secondary | ICD-10-CM | POA: Diagnosis not present

## 2020-05-15 ENCOUNTER — Encounter: Payer: BC Managed Care – PPO | Admitting: Family Medicine

## 2020-05-20 ENCOUNTER — Other Ambulatory Visit: Payer: Self-pay

## 2020-05-20 DIAGNOSIS — M5137 Other intervertebral disc degeneration, lumbosacral region: Secondary | ICD-10-CM | POA: Diagnosis not present

## 2020-05-20 DIAGNOSIS — M50321 Other cervical disc degeneration at C4-C5 level: Secondary | ICD-10-CM | POA: Diagnosis not present

## 2020-05-20 DIAGNOSIS — M50323 Other cervical disc degeneration at C6-C7 level: Secondary | ICD-10-CM | POA: Diagnosis not present

## 2020-05-20 DIAGNOSIS — M9903 Segmental and somatic dysfunction of lumbar region: Secondary | ICD-10-CM | POA: Diagnosis not present

## 2020-05-21 ENCOUNTER — Ambulatory Visit (INDEPENDENT_AMBULATORY_CARE_PROVIDER_SITE_OTHER): Payer: BC Managed Care – PPO | Admitting: Family Medicine

## 2020-05-21 ENCOUNTER — Encounter: Payer: Self-pay | Admitting: Family Medicine

## 2020-05-21 VITALS — BP 118/70 | HR 73 | Temp 97.7°F | Ht 74.0 in | Wt 182.5 lb

## 2020-05-21 DIAGNOSIS — H209 Unspecified iridocyclitis: Secondary | ICD-10-CM | POA: Diagnosis not present

## 2020-05-21 DIAGNOSIS — Z0000 Encounter for general adult medical examination without abnormal findings: Secondary | ICD-10-CM | POA: Diagnosis not present

## 2020-05-21 DIAGNOSIS — E119 Type 2 diabetes mellitus without complications: Secondary | ICD-10-CM

## 2020-05-21 MED ORDER — FINASTERIDE 5 MG PO TABS: ORAL_TABLET | ORAL | 3 refills | Status: DC

## 2020-05-21 NOTE — Progress Notes (Signed)
Subjective:    Patient ID: [name] [name]., male    DOB: Feb 16, 1946, 74 y.o.   MRN: [id number]  HPI Here for a well exam. He feels well but he does mention some mild numbness and tingling in the left lower leg and foot that comes and goes. This started about a month ago. No pain. He had a good exam with Dr. Acie Fredrickson, and his uveitis is stable. He is fully vaccinated against the Covid virus, and he is scheduled to get a booster this afternoon.    Review of Systems  Constitutional: Negative.   HENT: Negative.   Eyes: Negative.   Respiratory: Negative.   Cardiovascular: Negative.   Gastrointestinal: Negative.   Genitourinary: Negative.   Musculoskeletal: Negative.   Skin: Negative.   Neurological: Positive for numbness.  Psychiatric/Behavioral: Negative.        Objective:   Physical Exam Constitutional:      General: He is not in acute distress.    Appearance: He is well-developed. He is not diaphoretic.  HENT:     Head: Normocephalic and atraumatic.     Right Ear: External ear normal.     Left Ear: External ear normal.     Nose: Nose normal.     Mouth/Throat:     Pharynx: No oropharyngeal exudate.  Eyes:     General: No scleral icterus.       Right eye: No discharge.        Left eye: No discharge.     Conjunctiva/sclera: Conjunctivae normal.     Pupils: Pupils are equal, round, and reactive to light.  Neck:     Thyroid: No thyromegaly.     Vascular: No JVD.     Trachea: No tracheal deviation.  Cardiovascular:     Rate and Rhythm: Normal rate and regular rhythm.     Heart sounds: Normal heart sounds. No murmur heard.  No friction rub. No gallop.   Pulmonary:     Effort: Pulmonary effort is normal. No respiratory distress.     Breath sounds: Normal breath sounds. No wheezing or rales.  Chest:     Chest wall: No tenderness.  Abdominal:     General: Bowel sounds are normal. There is no distension.     Palpations: Abdomen is soft. There is no mass.      Tenderness: There is no abdominal tenderness. There is no guarding or rebound.  Genitourinary:    Penis: Normal. No tenderness.      Prostate: Normal.     Rectum: Normal. Guaiac result negative.  Musculoskeletal:        General: No tenderness. Normal range of motion.     Cervical back: Neck supple.  Lymphadenopathy:     Cervical: No cervical adenopathy.  Skin:    General: Skin is warm and dry.     Coloration: Skin is not pale.     Findings: No erythema or rash.  Neurological:     Mental Status: He is alert and oriented to person, place, and time.     Cranial Nerves: No cranial nerve deficit.     Motor: No abnormal muscle tone.     Coordination: Coordination normal.     Deep Tendon Reflexes: Reflexes are normal and symmetric. Reflexes normal.  Psychiatric:        Behavior: Behavior normal.        Thought Content: Thought content normal.        Judgment: Judgment normal.  Assessment & Plan:  Well exam. We discussed diet and exercise. Get fasting labs. He seems to have the beginnings of some diabetic neuropathy. He already takes vitamins including a B complex. We will monitor this for now.  Alysia Penna, MD

## 2020-05-22 LAB — LIPID PANEL
Cholesterol: 139 mg/dL (ref <200)
HDL: 43 mg/dL (ref >=40)
LDL Cholesterol (Calc): 71 mg/dL (calc)
Non-HDL Cholesterol (Calc): 96 mg/dL (calc) (ref <130)
Total CHOL/HDL Ratio: 3.2 (calc) (ref <5.0)
Triglycerides: 178 mg/dL — ABNORMAL HIGH (ref <150)

## 2020-05-22 LAB — HEPATIC FUNCTION PANEL
AG Ratio: 2 (calc) (ref 1.0–2.5)
ALT: 29 U/L (ref 9–46)
AST: 29 U/L (ref 10–35)
Albumin: 4.4 g/dL (ref 3.6–5.1)
Alkaline phosphatase (APISO): 47 U/L (ref 35–144)
Bilirubin, Direct: 0.1 mg/dL (ref 0.0–0.2)
Globulin: 2.2 g/dL (calc) (ref 1.9–3.7)
Indirect Bilirubin: 0.6 mg/dL (calc) (ref 0.2–1.2)
Total Bilirubin: 0.7 mg/dL (ref 0.2–1.2)
Total Protein: 6.6 g/dL (ref 6.1–8.1)

## 2020-05-22 LAB — BASIC METABOLIC PANEL
BUN: 24 mg/dL (ref 7–25)
CO2: 25 mmol/L (ref 20–32)
Calcium: 9.8 mg/dL (ref 8.6–10.3)
Chloride: 104 mmol/L (ref 98–110)
Creat: 1.18 mg/dL (ref 0.70–1.18)
Glucose, Bld: 141 mg/dL — ABNORMAL HIGH (ref 65–99)
Potassium: 4.6 mmol/L (ref 3.5–5.3)
Sodium: 138 mmol/L (ref 135–146)

## 2020-05-22 LAB — CBC WITH DIFFERENTIAL/PLATELET
Absolute Monocytes: 435 cells/uL (ref 200–950)
Basophils Absolute: 50 cells/uL (ref 0–200)
Basophils Relative: 0.9 %
Eosinophils Absolute: 204 cells/uL (ref 15–500)
Eosinophils Relative: 3.7 %
HCT: 42.3 % (ref 38.5–50.0)
Hemoglobin: 14.7 g/dL (ref 13.2–17.1)
Lymphs Abs: 1590 cells/uL (ref 850–3900)
MCH: 37.7 pg — ABNORMAL HIGH (ref 27.0–33.0)
MCHC: 34.8 g/dL (ref 32.0–36.0)
MCV: 108.5 fL — ABNORMAL HIGH (ref 80.0–100.0)
MPV: 9.5 fL (ref 7.5–12.5)
Monocytes Relative: 7.9 %
Neutro Abs: 3223 cells/uL (ref 1500–7800)
Neutrophils Relative %: 58.6 %
Platelets: 193 10*3/uL (ref 140–400)
RBC: 3.9 10*6/uL — ABNORMAL LOW (ref 4.20–5.80)
RDW: 12.9 % (ref 11.0–15.0)
Total Lymphocyte: 28.9 %
WBC: 5.5 10*3/uL (ref 3.8–10.8)

## 2020-05-22 LAB — TSH: TSH: 0.94 mIU/L (ref 0.40–4.50)

## 2020-05-22 LAB — HEMOGLOBIN A1C
Hgb A1c MFr Bld: 7.3 % of total Hgb — ABNORMAL HIGH (ref <5.7)
Mean Plasma Glucose: 163 (calc)
eAG (mmol/L): 9 (calc)

## 2020-05-22 LAB — PSA: PSA: 0.8 ng/mL (ref <=4.0)

## 2020-05-26 ENCOUNTER — Other Ambulatory Visit: Payer: Self-pay | Admitting: Family Medicine

## 2020-06-08 ENCOUNTER — Other Ambulatory Visit: Payer: Self-pay | Admitting: Family Medicine

## 2020-06-17 ENCOUNTER — Other Ambulatory Visit: Payer: Self-pay | Admitting: Family Medicine

## 2020-06-17 DIAGNOSIS — M50323 Other cervical disc degeneration at C6-C7 level: Secondary | ICD-10-CM | POA: Diagnosis not present

## 2020-06-17 DIAGNOSIS — M9903 Segmental and somatic dysfunction of lumbar region: Secondary | ICD-10-CM | POA: Diagnosis not present

## 2020-06-17 DIAGNOSIS — M50321 Other cervical disc degeneration at C4-C5 level: Secondary | ICD-10-CM | POA: Diagnosis not present

## 2020-06-17 DIAGNOSIS — M5137 Other intervertebral disc degeneration, lumbosacral region: Secondary | ICD-10-CM | POA: Diagnosis not present

## 2020-06-22 DIAGNOSIS — H401123 Primary open-angle glaucoma, left eye, severe stage: Secondary | ICD-10-CM | POA: Diagnosis not present

## 2020-06-22 DIAGNOSIS — H401112 Primary open-angle glaucoma, right eye, moderate stage: Secondary | ICD-10-CM | POA: Diagnosis not present

## 2020-06-22 DIAGNOSIS — H20021 Recurrent acute iridocyclitis, right eye: Secondary | ICD-10-CM | POA: Diagnosis not present

## 2020-06-22 DIAGNOSIS — Z961 Presence of intraocular lens: Secondary | ICD-10-CM | POA: Diagnosis not present

## 2020-07-10 DIAGNOSIS — Z09 Encounter for follow-up examination after completed treatment for conditions other than malignant neoplasm: Secondary | ICD-10-CM | POA: Diagnosis not present

## 2020-07-10 DIAGNOSIS — H6123 Impacted cerumen, bilateral: Secondary | ICD-10-CM | POA: Diagnosis not present

## 2020-07-14 DIAGNOSIS — H44113 Panuveitis, bilateral: Secondary | ICD-10-CM | POA: Diagnosis not present

## 2020-07-14 DIAGNOSIS — Z79899 Other long term (current) drug therapy: Secondary | ICD-10-CM | POA: Diagnosis not present

## 2020-07-14 DIAGNOSIS — H30033 Focal chorioretinal inflammation, peripheral, bilateral: Secondary | ICD-10-CM | POA: Diagnosis not present

## 2020-07-21 ENCOUNTER — Encounter: Payer: Self-pay | Admitting: Family Medicine

## 2020-07-21 DIAGNOSIS — M79672 Pain in left foot: Secondary | ICD-10-CM

## 2020-07-21 NOTE — Telephone Encounter (Signed)
I need to know what the problem is to refer

## 2020-07-22 DIAGNOSIS — M9903 Segmental and somatic dysfunction of lumbar region: Secondary | ICD-10-CM | POA: Diagnosis not present

## 2020-07-22 DIAGNOSIS — M50321 Other cervical disc degeneration at C4-C5 level: Secondary | ICD-10-CM | POA: Diagnosis not present

## 2020-07-22 DIAGNOSIS — M5137 Other intervertebral disc degeneration, lumbosacral region: Secondary | ICD-10-CM | POA: Diagnosis not present

## 2020-07-22 DIAGNOSIS — M50323 Other cervical disc degeneration at C6-C7 level: Secondary | ICD-10-CM | POA: Diagnosis not present

## 2020-07-23 NOTE — Telephone Encounter (Signed)
I understand. The referral was done

## 2020-07-27 ENCOUNTER — Ambulatory Visit: Payer: BC Managed Care – PPO | Admitting: Podiatry

## 2020-07-28 DIAGNOSIS — H30033 Focal chorioretinal inflammation, peripheral, bilateral: Secondary | ICD-10-CM | POA: Diagnosis not present

## 2020-07-28 DIAGNOSIS — H3581 Retinal edema: Secondary | ICD-10-CM | POA: Diagnosis not present

## 2020-07-28 DIAGNOSIS — E119 Type 2 diabetes mellitus without complications: Secondary | ICD-10-CM | POA: Diagnosis not present

## 2020-07-28 DIAGNOSIS — Z961 Presence of intraocular lens: Secondary | ICD-10-CM | POA: Diagnosis not present

## 2020-07-29 DIAGNOSIS — G5762 Lesion of plantar nerve, left lower limb: Secondary | ICD-10-CM | POA: Diagnosis not present

## 2020-07-29 DIAGNOSIS — M79672 Pain in left foot: Secondary | ICD-10-CM | POA: Insufficient documentation

## 2020-07-29 DIAGNOSIS — M2022 Hallux rigidus, left foot: Secondary | ICD-10-CM | POA: Diagnosis not present

## 2020-08-15 ENCOUNTER — Other Ambulatory Visit: Payer: Self-pay | Admitting: Family Medicine

## 2020-08-19 DIAGNOSIS — M5137 Other intervertebral disc degeneration, lumbosacral region: Secondary | ICD-10-CM | POA: Diagnosis not present

## 2020-08-19 DIAGNOSIS — M50321 Other cervical disc degeneration at C4-C5 level: Secondary | ICD-10-CM | POA: Diagnosis not present

## 2020-08-19 DIAGNOSIS — M50323 Other cervical disc degeneration at C6-C7 level: Secondary | ICD-10-CM | POA: Diagnosis not present

## 2020-08-19 DIAGNOSIS — M9903 Segmental and somatic dysfunction of lumbar region: Secondary | ICD-10-CM | POA: Diagnosis not present

## 2020-08-31 DIAGNOSIS — L570 Actinic keratosis: Secondary | ICD-10-CM | POA: Diagnosis not present

## 2020-09-01 DIAGNOSIS — M25562 Pain in left knee: Secondary | ICD-10-CM | POA: Diagnosis not present

## 2020-09-11 ENCOUNTER — Other Ambulatory Visit: Payer: Self-pay | Admitting: Family Medicine

## 2020-09-16 DIAGNOSIS — M5137 Other intervertebral disc degeneration, lumbosacral region: Secondary | ICD-10-CM | POA: Diagnosis not present

## 2020-09-16 DIAGNOSIS — M50321 Other cervical disc degeneration at C4-C5 level: Secondary | ICD-10-CM | POA: Diagnosis not present

## 2020-09-16 DIAGNOSIS — M9903 Segmental and somatic dysfunction of lumbar region: Secondary | ICD-10-CM | POA: Diagnosis not present

## 2020-09-16 DIAGNOSIS — M50323 Other cervical disc degeneration at C6-C7 level: Secondary | ICD-10-CM | POA: Diagnosis not present

## 2020-09-16 NOTE — Telephone Encounter (Signed)
Last office visit-05/21/2020 Last refill- 03/18/2020- 90tabs with 1 refill

## 2020-10-12 ENCOUNTER — Other Ambulatory Visit: Payer: Self-pay | Admitting: Family Medicine

## 2020-10-13 NOTE — Telephone Encounter (Signed)
Requesting: Ambien Contract: 11/01/13. UDS: 11/01/13. Last Visit: 05/21/2020 Next Visit: none Last Refill: 09/18/2020  Please Advise

## 2020-10-14 DIAGNOSIS — M50321 Other cervical disc degeneration at C4-C5 level: Secondary | ICD-10-CM | POA: Diagnosis not present

## 2020-10-14 DIAGNOSIS — M5137 Other intervertebral disc degeneration, lumbosacral region: Secondary | ICD-10-CM | POA: Diagnosis not present

## 2020-10-14 DIAGNOSIS — M9903 Segmental and somatic dysfunction of lumbar region: Secondary | ICD-10-CM | POA: Diagnosis not present

## 2020-10-14 DIAGNOSIS — M50323 Other cervical disc degeneration at C6-C7 level: Secondary | ICD-10-CM | POA: Diagnosis not present

## 2020-10-19 DIAGNOSIS — H401112 Primary open-angle glaucoma, right eye, moderate stage: Secondary | ICD-10-CM | POA: Diagnosis not present

## 2020-10-19 DIAGNOSIS — H20021 Recurrent acute iridocyclitis, right eye: Secondary | ICD-10-CM | POA: Diagnosis not present

## 2020-10-19 DIAGNOSIS — H43812 Vitreous degeneration, left eye: Secondary | ICD-10-CM | POA: Diagnosis not present

## 2020-10-19 DIAGNOSIS — H401123 Primary open-angle glaucoma, left eye, severe stage: Secondary | ICD-10-CM | POA: Diagnosis not present

## 2020-10-21 DIAGNOSIS — Z85828 Personal history of other malignant neoplasm of skin: Secondary | ICD-10-CM | POA: Diagnosis not present

## 2020-10-21 DIAGNOSIS — L570 Actinic keratosis: Secondary | ICD-10-CM | POA: Diagnosis not present

## 2020-10-21 DIAGNOSIS — L821 Other seborrheic keratosis: Secondary | ICD-10-CM | POA: Diagnosis not present

## 2020-10-21 DIAGNOSIS — L812 Freckles: Secondary | ICD-10-CM | POA: Diagnosis not present

## 2020-10-21 DIAGNOSIS — D1801 Hemangioma of skin and subcutaneous tissue: Secondary | ICD-10-CM | POA: Diagnosis not present

## 2020-10-23 DIAGNOSIS — M79672 Pain in left foot: Secondary | ICD-10-CM | POA: Diagnosis not present

## 2020-10-26 DIAGNOSIS — H30033 Focal chorioretinal inflammation, peripheral, bilateral: Secondary | ICD-10-CM | POA: Diagnosis not present

## 2020-10-26 DIAGNOSIS — Z79899 Other long term (current) drug therapy: Secondary | ICD-10-CM | POA: Diagnosis not present

## 2020-11-02 DIAGNOSIS — M25562 Pain in left knee: Secondary | ICD-10-CM | POA: Diagnosis not present

## 2020-11-03 DIAGNOSIS — Z961 Presence of intraocular lens: Secondary | ICD-10-CM | POA: Diagnosis not present

## 2020-11-03 DIAGNOSIS — H3581 Retinal edema: Secondary | ICD-10-CM | POA: Diagnosis not present

## 2020-11-03 DIAGNOSIS — E119 Type 2 diabetes mellitus without complications: Secondary | ICD-10-CM | POA: Diagnosis not present

## 2020-11-03 DIAGNOSIS — H30033 Focal chorioretinal inflammation, peripheral, bilateral: Secondary | ICD-10-CM | POA: Diagnosis not present

## 2020-11-04 DIAGNOSIS — M50323 Other cervical disc degeneration at C6-C7 level: Secondary | ICD-10-CM | POA: Diagnosis not present

## 2020-11-04 DIAGNOSIS — M9903 Segmental and somatic dysfunction of lumbar region: Secondary | ICD-10-CM | POA: Diagnosis not present

## 2020-11-04 DIAGNOSIS — M5137 Other intervertebral disc degeneration, lumbosacral region: Secondary | ICD-10-CM | POA: Diagnosis not present

## 2020-11-04 DIAGNOSIS — M50321 Other cervical disc degeneration at C4-C5 level: Secondary | ICD-10-CM | POA: Diagnosis not present

## 2020-12-02 DIAGNOSIS — M50321 Other cervical disc degeneration at C4-C5 level: Secondary | ICD-10-CM | POA: Diagnosis not present

## 2020-12-02 DIAGNOSIS — M5137 Other intervertebral disc degeneration, lumbosacral region: Secondary | ICD-10-CM | POA: Diagnosis not present

## 2020-12-02 DIAGNOSIS — M50323 Other cervical disc degeneration at C6-C7 level: Secondary | ICD-10-CM | POA: Diagnosis not present

## 2020-12-02 DIAGNOSIS — M9903 Segmental and somatic dysfunction of lumbar region: Secondary | ICD-10-CM | POA: Diagnosis not present

## 2020-12-09 ENCOUNTER — Ambulatory Visit (INDEPENDENT_AMBULATORY_CARE_PROVIDER_SITE_OTHER): Payer: BC Managed Care – PPO | Admitting: Family Medicine

## 2020-12-09 ENCOUNTER — Other Ambulatory Visit: Payer: Self-pay

## 2020-12-09 ENCOUNTER — Encounter: Payer: Self-pay | Admitting: Family Medicine

## 2020-12-09 VITALS — BP 110/68 | HR 63 | Temp 97.6°F | Wt 183.0 lb

## 2020-12-09 DIAGNOSIS — G629 Polyneuropathy, unspecified: Secondary | ICD-10-CM

## 2020-12-09 DIAGNOSIS — H6123 Impacted cerumen, bilateral: Secondary | ICD-10-CM | POA: Diagnosis not present

## 2020-12-09 MED ORDER — GABAPENTIN 100 MG PO CAPS: 100.0000 mg | ORAL_CAPSULE | Freq: Two times a day (BID) | ORAL | 3 refills | Status: DC

## 2020-12-09 NOTE — Progress Notes (Signed)
   Subjective:    Patient ID: [name] [name]., male    DOB: 1945/11/23, 75 y.o.   MRN: [id number]  HPI Here for possible neuropathy in the feet. This started about a year ago with numbness and tingling in the toes of his left foot. Now he has some of this in the right foot as well. Sometimes he feels burning in the toes. He takes a B complex vitamin every day. His diabetes is well controlled. He checks an A1c at home from time to time, and yesterday it was 5.5.    Review of Systems  Constitutional: Negative.   Respiratory: Negative.   Cardiovascular: Negative.   Neurological: Positive for numbness.       Objective:   Physical Exam Constitutional:      Appearance: Normal appearance.  Cardiovascular:     Rate and Rhythm: Normal rate and regular rhythm.     Pulses: Normal pulses.     Heart sounds: Normal heart sounds.  Pulmonary:     Effort: Pulmonary effort is normal.     Breath sounds: Normal breath sounds.  Neurological:     Mental Status: He is alert.     Comments: Both feet are pink and warm. Full pedal pulses. He has slightly decreased sensation to light touch in the left foot compared to the right            Assessment & Plan:  Neuropathy. He will try Gabapentin 100 mg BID and will report back in one month.  Alysia Penna, MD

## 2020-12-29 DIAGNOSIS — M25562 Pain in left knee: Secondary | ICD-10-CM | POA: Diagnosis not present

## 2021-01-04 DIAGNOSIS — M50323 Other cervical disc degeneration at C6-C7 level: Secondary | ICD-10-CM | POA: Diagnosis not present

## 2021-01-04 DIAGNOSIS — M50321 Other cervical disc degeneration at C4-C5 level: Secondary | ICD-10-CM | POA: Diagnosis not present

## 2021-01-04 DIAGNOSIS — M5137 Other intervertebral disc degeneration, lumbosacral region: Secondary | ICD-10-CM | POA: Diagnosis not present

## 2021-01-04 DIAGNOSIS — M9903 Segmental and somatic dysfunction of lumbar region: Secondary | ICD-10-CM | POA: Diagnosis not present

## 2021-01-12 ENCOUNTER — Other Ambulatory Visit: Payer: Self-pay | Admitting: Family Medicine

## 2021-01-20 DIAGNOSIS — H44113 Panuveitis, bilateral: Secondary | ICD-10-CM | POA: Diagnosis not present

## 2021-01-20 DIAGNOSIS — H30033 Focal chorioretinal inflammation, peripheral, bilateral: Secondary | ICD-10-CM | POA: Diagnosis not present

## 2021-01-20 DIAGNOSIS — Z79899 Other long term (current) drug therapy: Secondary | ICD-10-CM | POA: Diagnosis not present

## 2021-02-10 DIAGNOSIS — M50321 Other cervical disc degeneration at C4-C5 level: Secondary | ICD-10-CM | POA: Diagnosis not present

## 2021-02-10 DIAGNOSIS — M50323 Other cervical disc degeneration at C6-C7 level: Secondary | ICD-10-CM | POA: Diagnosis not present

## 2021-02-10 DIAGNOSIS — M9903 Segmental and somatic dysfunction of lumbar region: Secondary | ICD-10-CM | POA: Diagnosis not present

## 2021-02-10 DIAGNOSIS — M5137 Other intervertebral disc degeneration, lumbosacral region: Secondary | ICD-10-CM | POA: Diagnosis not present

## 2021-02-12 ENCOUNTER — Ambulatory Visit: Payer: BC Managed Care – PPO | Admitting: Cardiovascular Disease

## 2021-02-17 ENCOUNTER — Encounter: Payer: Self-pay | Admitting: Family Medicine

## 2021-02-17 ENCOUNTER — Telehealth (INDEPENDENT_AMBULATORY_CARE_PROVIDER_SITE_OTHER): Payer: BC Managed Care – PPO | Admitting: Family Medicine

## 2021-02-17 VITALS — Temp 97.6°F | Wt 181.2 lb

## 2021-02-17 DIAGNOSIS — U071 COVID-19: Secondary | ICD-10-CM

## 2021-02-17 MED ORDER — NIRMATRELVIR/RITONAVIR (PAXLOVID)TABLET: 3.0000 | ORAL_TABLET | Freq: Two times a day (BID) | ORAL | 0 refills | Status: AC

## 2021-02-17 NOTE — Progress Notes (Signed)
   Subjective:    Patient ID: [name] [name]., male    DOB: 1946-05-16, 75 y.o.   MRN: [id number]  HPI Virtual Visit via Telephone Note  I connected with the patient on 02/17/21 at  8:45 AM EDT by telephone and verified that I am speaking with the correct person using two identifiers.   I discussed the limitations, risks, security and privacy concerns of performing an evaluation and management service by telephone and the availability of in person appointments. I also discussed with the patient that there may be a patient responsible charge related to this service. The patient expressed understanding and agreed to proceed.  Location patient: home Location provider: work or home office Participants present for the call: patient, provider Patient did not have a visit in the prior 7 days to address this/these issue(s).   History of Present Illness: Here for a Covid-19 infection. Last week he was traveling in [facility] [place] and he developed fatigue, body aches, and fever to 100.7 degrees. No cough or SOB or NVD. He is drinking fluids and taking Tylenol. He tested positive for the Covid virus twice on 02-14-21 and again on 02-16-21.    Observations/Objective: Patient sounds cheerful and well on the phone. I do not appreciate any SOB. Speech and thought processing are grossly intact. Patient reported vitals:  Assessment and Plan: Covid-19 infection. We will treat him with 5 days of Paxlovid. He will quarantine for 5 days. His wife, who was traveling with him, is also coughing but she has not been tested for the Covid virus. I told him she most likely has it also, and she should take a test and then contact us afterwards.  Alysia Penna, MD   Follow Up Instructions:     [phone] [phone] 11-20 [id number] 21-30 I did not refer this patient for an OV in the next 24 hours for this/these issue(s).  I discussed the assessment and treatment plan with the patient. The patient was provided an  opportunity to ask questions and all were answered. The patient agreed with the plan and demonstrated an understanding of the instructions.   The patient was advised to call back or seek an in-person evaluation if the symptoms worsen or if the condition fails to improve as anticipated.  I provided 16 minutes of non-face-to-face time during this encounter.   Alysia Penna, MD    Review of Systems     Objective:   Physical Exam        Assessment & Plan:

## 2021-02-21 ENCOUNTER — Encounter: Payer: Self-pay | Admitting: Family Medicine

## 2021-02-24 NOTE — Telephone Encounter (Signed)
I have not heard this from anyone else, but it is possibly an effect of the medication. If so, this should settle down in a few days

## 2021-02-26 ENCOUNTER — Ambulatory Visit: Payer: BC Managed Care – PPO | Admitting: Cardiovascular Disease

## 2021-03-01 DIAGNOSIS — Z961 Presence of intraocular lens: Secondary | ICD-10-CM | POA: Diagnosis not present

## 2021-03-01 DIAGNOSIS — H401112 Primary open-angle glaucoma, right eye, moderate stage: Secondary | ICD-10-CM | POA: Diagnosis not present

## 2021-03-01 DIAGNOSIS — H20021 Recurrent acute iridocyclitis, right eye: Secondary | ICD-10-CM | POA: Diagnosis not present

## 2021-03-01 DIAGNOSIS — H401123 Primary open-angle glaucoma, left eye, severe stage: Secondary | ICD-10-CM | POA: Diagnosis not present

## 2021-03-02 ENCOUNTER — Encounter: Payer: Self-pay | Admitting: Family Medicine

## 2021-03-03 NOTE — Telephone Encounter (Signed)
See my other note from today

## 2021-03-03 NOTE — Telephone Encounter (Signed)
Spoke with pt, notified of Dr Sarajane Jews advise, verbalized understanding

## 2021-03-03 NOTE — Telephone Encounter (Signed)
This encounter is resolved 

## 2021-03-03 NOTE — Telephone Encounter (Signed)
I would say his contagious period would have been for 5 days starting on June 15. Therefore he is NOT contagious at this point

## 2021-03-10 DIAGNOSIS — L821 Other seborrheic keratosis: Secondary | ICD-10-CM | POA: Diagnosis not present

## 2021-03-10 DIAGNOSIS — Z85828 Personal history of other malignant neoplasm of skin: Secondary | ICD-10-CM | POA: Diagnosis not present

## 2021-03-10 DIAGNOSIS — L570 Actinic keratosis: Secondary | ICD-10-CM | POA: Diagnosis not present

## 2021-03-24 DIAGNOSIS — M9903 Segmental and somatic dysfunction of lumbar region: Secondary | ICD-10-CM | POA: Diagnosis not present

## 2021-03-24 DIAGNOSIS — M50323 Other cervical disc degeneration at C6-C7 level: Secondary | ICD-10-CM | POA: Diagnosis not present

## 2021-03-24 DIAGNOSIS — M50321 Other cervical disc degeneration at C4-C5 level: Secondary | ICD-10-CM | POA: Diagnosis not present

## 2021-03-24 DIAGNOSIS — M5137 Other intervertebral disc degeneration, lumbosacral region: Secondary | ICD-10-CM | POA: Diagnosis not present

## 2021-03-30 ENCOUNTER — Other Ambulatory Visit: Payer: Self-pay | Admitting: Family Medicine

## 2021-03-30 DIAGNOSIS — H3581 Retinal edema: Secondary | ICD-10-CM | POA: Diagnosis not present

## 2021-03-30 DIAGNOSIS — H30033 Focal chorioretinal inflammation, peripheral, bilateral: Secondary | ICD-10-CM | POA: Diagnosis not present

## 2021-03-30 DIAGNOSIS — Z961 Presence of intraocular lens: Secondary | ICD-10-CM | POA: Diagnosis not present

## 2021-03-30 DIAGNOSIS — E119 Type 2 diabetes mellitus without complications: Secondary | ICD-10-CM | POA: Diagnosis not present

## 2021-04-14 ENCOUNTER — Other Ambulatory Visit: Payer: Self-pay | Admitting: Family Medicine

## 2021-04-14 DIAGNOSIS — M5137 Other intervertebral disc degeneration, lumbosacral region: Secondary | ICD-10-CM | POA: Diagnosis not present

## 2021-04-14 DIAGNOSIS — M9903 Segmental and somatic dysfunction of lumbar region: Secondary | ICD-10-CM | POA: Diagnosis not present

## 2021-04-14 DIAGNOSIS — M50321 Other cervical disc degeneration at C4-C5 level: Secondary | ICD-10-CM | POA: Diagnosis not present

## 2021-04-14 DIAGNOSIS — M50323 Other cervical disc degeneration at C6-C7 level: Secondary | ICD-10-CM | POA: Diagnosis not present

## 2021-04-16 ENCOUNTER — Other Ambulatory Visit: Payer: Self-pay

## 2021-04-16 ENCOUNTER — Encounter: Payer: Self-pay | Admitting: Cardiovascular Disease

## 2021-04-16 ENCOUNTER — Ambulatory Visit (INDEPENDENT_AMBULATORY_CARE_PROVIDER_SITE_OTHER): Payer: BC Managed Care – PPO | Admitting: Cardiovascular Disease

## 2021-04-16 VITALS — BP 110/68 | HR 67 | Ht 74.0 in | Wt 184.0 lb

## 2021-04-16 DIAGNOSIS — I119 Hypertensive heart disease without heart failure: Secondary | ICD-10-CM | POA: Diagnosis not present

## 2021-04-16 DIAGNOSIS — I10 Essential (primary) hypertension: Secondary | ICD-10-CM

## 2021-04-16 DIAGNOSIS — I440 Atrioventricular block, first degree: Secondary | ICD-10-CM | POA: Diagnosis not present

## 2021-04-16 DIAGNOSIS — R002 Palpitations: Secondary | ICD-10-CM | POA: Diagnosis not present

## 2021-04-16 NOTE — Patient Instructions (Signed)

## 2021-04-16 NOTE — Progress Notes (Signed)
Cardiology Office Note:    Date:  04/16/2021   ID:  [name] Emerald., DOB 12/11/1945, MRN [id number]  PCP:  [name] Morale, MD  Cardiologist:  [name] [name]  Electrophysiologist:  None   Referring MD: [name] Morale, MD   Chief Complaint  Patient presents with   Hypertension     Prior Notes;    [name] Bunker. is a 74 y.o. male with a hx of HTN, hyperlipidemia, palpitations. Takes Humira for Iritis   He has a strong family hx of atrial fib ( father and sister )   The palptations seem to be worse with lack of sleep and before he eats .  Feels like his heart stops for a second or so ,  Then might have another episode less than a minute later.   No syncope or near syncope  Walks regularly ,  3-4 times a week .  2 miles at a time .  Feels well with walking .   BP is well controlled.   Aug. 5, 2022 [name] [name] is seen today for follow up of his hypertension and history of palpitations.  He also has a history of hyperlipidemia.  Event monitor showed occasional PVCs, no atrial fib   Has developed a neuropathy Is on gabapentin - which helps with the pain   Past Medical History:  Diagnosis Date   Cataract    Bil/ right eye surgery   Diabetes mellitus    type II   Diverticulitis of colon    ED (erectile dysfunction)    Gout    no meds   H/O echocardiogram 2013   normal    History of irregular heartbeat    Per pt, heart skips beats at times/had Echo   Hyperlipidemia    Hypertension    Iritis, recurrent     sees Dr. Silvestre Gun[facility] at [facility]    Normal cardiac stress test 09/27/2002   Palpitations    PVCs and PACs per event monitor   Post-operative nausea and vomiting    Uveitis    sees Dr Polly Cobia at [facility] Dr Manuella Ghazi now    Past Surgical History:  Procedure Laterality Date   APPENDECTOMY     CATARACT EXTRACTION     Dr Katy Fitch , left eye/ 2 years ago   CATARACT EXTRACTION  03/16/2017   Right eye   COLONOSCOPY  04/20/2017   per Dr. Loletha Carrow, adenomatous polyps, repeat in  5 yrs    SHOULDER ARTHROSCOPY     both shoulders    TONSILLECTOMY     TONSILLECTOMY AND ADENOIDECTOMY     tubes in ears     55 years ago    Current Medications: Current Meds  Medication Sig   acyclovir (ZOVIRAX) 800 MG tablet Take 800 mg by mouth 2 (two) times daily.   Adalimumab 40 MG/0.4ML PSKT Inject into the skin. Humira 40 mg SQ every other week   aspirin 81 MG tablet Take 81 mg by mouth daily.   brimonidine (ALPHAGAN) 0.2 % ophthalmic solution Place 1 drop into both eyes as needed. Used 1 or 2 a year   brimonidine-timolol (COMBIGAN) 0.2-0.5 % ophthalmic solution Place 1 drop into both eyes every 12 (twelve) hours. Reported on 11/23/2015   calcium-vitamin D (OSCAL WITH D) 500-200 MG-UNIT per tablet Take 2 tablets by mouth daily.   CONTOUR NEXT TEST test strip CHECK BLOOD SUGAR THREE TO FOUR TIMES DAILY   finasteride (PROSCAR) 5 MG tablet TAKE 1 TABLET(5 MG) BY  MOUTH DAILY   FOLIC ACID PO Take 10 mg by mouth daily.    gabapentin (NEURONTIN) 100 MG capsule TAKE 1 CAPSULE(100 MG) BY MOUTH TWICE DAILY   lisinopril (ZESTRIL) 20 MG tablet TAKE 1/2 TABLET BY MOUTH DAILY   metFORMIN (GLUCOPHAGE) 1000 MG tablet Take 1 tablet (1,000 mg total) by mouth 2 (two) times daily with a meal.   methotrexate (RHEUMATREX) 2.5 MG tablet Take 4 tablets by mouth once a week.   Microlet Lancets MISC TEST DAILY   Multiple Vitamin (MULTIVITAMIN) tablet Take 1 tablet by mouth daily.   rosuvastatin (CRESTOR) 20 MG tablet TAKE ONE TABLET BY MOUTH DAILY   tamsulosin (FLOMAX) 0.4 MG CAPS capsule Take 0.4 mg by mouth daily.   vitamin B-12 (CYANOCOBALAMIN) 500 MCG tablet Take 500 mcg by mouth daily.   zolpidem (AMBIEN) 10 MG tablet TAKE 1 TABLET(10 MG) BY MOUTH AT BEDTIME   Current Facility-Administered Medications for the 04/16/21 encounter (Office Visit) with [name] [name], Wonda Cheng, MD  Medication   0.9 %  sodium chloride infusion     Allergies:   Contrast media [iodinated diagnostic agents], Red dye, Fluorescein,  and Ioxaglate   Social History   Socioeconomic History   Marital status: Married    Spouse name: Not on file   Number of children: 1   Years of education: Not on file   Highest education level: Not on file  Occupational History   Occupation: Nurse, learning disability  Tobacco Use   Smoking status: Former    Packs/day: 1.00    Years: 8.00    Pack years: 8.00    Types: Cigarettes    Quit date: 02/06/1970    Years since quitting: 51.2   Smokeless tobacco: Never  Substance and Sexual Activity   Alcohol use: Yes    Alcohol/week: 7.0 standard drinks    Types: 7 Standard drinks or equivalent per week    Comment: glass of wine most days   Drug use: No   Sexual activity: Not on file  Other Topics Concern   Not on file  Social History Narrative   Not on file   Social Determinants of Health   Financial Resource Strain: Not on file  Food Insecurity: Not on file  Transportation Needs: Not on file  Physical Activity: Not on file  Stress: Not on file  Social Connections: Not on file     Family History: The patient's family history includes Colon cancer in his father; Diabetes in his paternal grandfather; Heart attack in his paternal grandmother; Heart attack (age of onset: 15) in his father; Heart disease in his father and mother; Kidney disease in his father; Thyroid cancer in his brother and mother. There is no history of Stomach cancer.  ROS:   Please see the history of present illness.     All other systems reviewed and are negative.  EKGs/Labs/Other Studies Reviewed:    The following studies were reviewed today:   EKG:     Aug. 5, 2022:   NSR at 67.  1st degree AV block   Recent Labs: 05/21/2020: ALT 29; BUN 24; Creat 1.18; Hemoglobin 14.7; Platelets 193; Potassium 4.6; Sodium 138; TSH 0.94  Recent Lipid Panel    Component Value Date/Time   CHOL 139 05/21/2020 0833   TRIG 178 (H) 05/21/2020 0833   TRIG 284 (HH) 09/20/2006 0929   HDL 43 05/21/2020 0833   CHOLHDL 3.2  05/21/2020 0833   VLDL 36.8 04/29/2019 0943   LDLCALC 71 05/21/2020 [id number]  LDLDIRECT 156.1 09/21/2007 0910    Physical Exam:    Physical Exam: Blood pressure 110/68, pulse 67, height '6\' 2"'$  (1.88 m), weight 184 lb (83.5 kg), SpO2 97 %.  GEN:  Well nourished, well developed in no acute distress HEENT: Normal NECK: No JVD; No carotid bruits LYMPHATICS: No lymphadenopathy CARDIAC: RRR   RESPIRATORY:  Clear to auscultation without rales, wheezing or rhonchi  ABDOMEN: Soft, non-tender, non-distended MUSCULOSKELETAL:  No edema; No deformity  SKIN: Warm and dry NEUROLOGIC:  Alert and oriented x 3   ASSESSMENT:    1. Hypertensive heart disease without heart failure   2. Palpitations   3. Essential hypertension   4. Heart block AV first degree     PLAN:       PVCs:   He has had some palpitations in the past.  Event monitor showed that these are premature ventricular contractions.  He did not have any atrial fibrillation.  We will continue to monitor him for atrial fibs since he has a strong family history of atrial fibrillation.  2.  First-degree AV block: This is stable.  He has not had any episodes of syncope.  3.  Hypertensive heart disease without congestive heart failure: Blood pressure remains stable.  Continue current medications.  He will follow-up with Dr. Sarajane Jews.  4.  Hyperlipidemia: His lipids from September, 2021 look good.  He will continue to follow-up with Dr. Sarajane Jews.  Continue current medications.    Medication Adjustments/Labs and Tests Ordered: Current medicines are reviewed at length with the patient today.  Concerns regarding medicines are outlined above.  Orders Placed This Encounter  Procedures   EKG 12-Lead    No orders of the defined types were placed in this encounter.      Signed, Mertie Moores, MD  04/16/2021 8:30 AM    [place]

## 2021-04-19 ENCOUNTER — Other Ambulatory Visit: Payer: Self-pay | Admitting: Family Medicine

## 2021-04-19 DIAGNOSIS — H6123 Impacted cerumen, bilateral: Secondary | ICD-10-CM | POA: Insufficient documentation

## 2021-04-19 DIAGNOSIS — L928 Other granulomatous disorders of the skin and subcutaneous tissue: Secondary | ICD-10-CM | POA: Diagnosis not present

## 2021-04-19 DIAGNOSIS — H7392 Unspecified disorder of tympanic membrane, left ear: Secondary | ICD-10-CM | POA: Diagnosis not present

## 2021-04-19 DIAGNOSIS — L929 Granulomatous disorder of the skin and subcutaneous tissue, unspecified: Secondary | ICD-10-CM | POA: Diagnosis not present

## 2021-04-19 DIAGNOSIS — H938X2 Other specified disorders of left ear: Secondary | ICD-10-CM | POA: Diagnosis not present

## 2021-04-19 NOTE — Telephone Encounter (Signed)
Pt LOV was on 02/17/2021 Last refill was done on 10/13/2020 for 90 tablets with 1 refill Please advise

## 2021-04-21 DIAGNOSIS — D1801 Hemangioma of skin and subcutaneous tissue: Secondary | ICD-10-CM | POA: Diagnosis not present

## 2021-04-21 DIAGNOSIS — L570 Actinic keratosis: Secondary | ICD-10-CM | POA: Diagnosis not present

## 2021-04-21 DIAGNOSIS — L812 Freckles: Secondary | ICD-10-CM | POA: Diagnosis not present

## 2021-04-21 DIAGNOSIS — D692 Other nonthrombocytopenic purpura: Secondary | ICD-10-CM | POA: Diagnosis not present

## 2021-04-21 DIAGNOSIS — Z85828 Personal history of other malignant neoplasm of skin: Secondary | ICD-10-CM | POA: Diagnosis not present

## 2021-05-03 DIAGNOSIS — H7392 Unspecified disorder of tympanic membrane, left ear: Secondary | ICD-10-CM | POA: Diagnosis not present

## 2021-05-03 DIAGNOSIS — Z8669 Personal history of other diseases of the nervous system and sense organs: Secondary | ICD-10-CM | POA: Diagnosis not present

## 2021-05-03 DIAGNOSIS — H6122 Impacted cerumen, left ear: Secondary | ICD-10-CM | POA: Diagnosis not present

## 2021-05-03 DIAGNOSIS — Z888 Allergy status to other drugs, medicaments and biological substances status: Secondary | ICD-10-CM | POA: Diagnosis not present

## 2021-05-12 DIAGNOSIS — M5137 Other intervertebral disc degeneration, lumbosacral region: Secondary | ICD-10-CM | POA: Diagnosis not present

## 2021-05-12 DIAGNOSIS — M50323 Other cervical disc degeneration at C6-C7 level: Secondary | ICD-10-CM | POA: Diagnosis not present

## 2021-05-12 DIAGNOSIS — M50321 Other cervical disc degeneration at C4-C5 level: Secondary | ICD-10-CM | POA: Diagnosis not present

## 2021-05-12 DIAGNOSIS — M9903 Segmental and somatic dysfunction of lumbar region: Secondary | ICD-10-CM | POA: Diagnosis not present

## 2021-05-25 ENCOUNTER — Other Ambulatory Visit: Payer: Self-pay

## 2021-05-26 ENCOUNTER — Encounter: Payer: Self-pay | Admitting: Family Medicine

## 2021-05-26 ENCOUNTER — Telehealth: Payer: Self-pay | Admitting: Family Medicine

## 2021-05-26 ENCOUNTER — Ambulatory Visit (INDEPENDENT_AMBULATORY_CARE_PROVIDER_SITE_OTHER): Payer: BC Managed Care – PPO | Admitting: Family Medicine

## 2021-05-26 VITALS — BP 108/68 | HR 72 | Temp 98.2°F | Ht 74.0 in | Wt 182.0 lb

## 2021-05-26 DIAGNOSIS — Z23 Encounter for immunization: Secondary | ICD-10-CM

## 2021-05-26 DIAGNOSIS — Z0000 Encounter for general adult medical examination without abnormal findings: Secondary | ICD-10-CM | POA: Diagnosis not present

## 2021-05-26 LAB — BASIC METABOLIC PANEL
BUN: 30 mg/dL — ABNORMAL HIGH (ref 6–23)
CO2: 24 mEq/L (ref 19–32)
Calcium: 9.6 mg/dL (ref 8.4–10.5)
Chloride: 99 mEq/L (ref 96–112)
Creatinine, Ser: 1.25 mg/dL (ref 0.40–1.50)
GFR: 56.15 mL/min — ABNORMAL LOW (ref >60.00)
Glucose, Bld: 129 mg/dL — ABNORMAL HIGH (ref 70–99)
Potassium: 5 mEq/L (ref 3.5–5.1)
Sodium: 133 mEq/L — ABNORMAL LOW (ref 135–145)

## 2021-05-26 LAB — CBC WITH DIFFERENTIAL/PLATELET
Basophils Absolute: 0 10*3/uL (ref 0.0–0.1)
Basophils Relative: 0.6 % (ref 0.0–3.0)
Eosinophils Absolute: 0.2 10*3/uL (ref 0.0–0.7)
Eosinophils Relative: 3.5 % (ref 0.0–5.0)
HCT: 41.5 % (ref 39.0–52.0)
Hemoglobin: 14.5 g/dL (ref 13.0–17.0)
Lymphocytes Relative: 31.5 % (ref 12.0–46.0)
Lymphs Abs: 2 10*3/uL (ref 0.7–4.0)
MCHC: 34.8 g/dL (ref 30.0–36.0)
MCV: 111.6 fl — ABNORMAL HIGH (ref 78.0–100.0)
Monocytes Absolute: 0.6 10*3/uL (ref 0.1–1.0)
Monocytes Relative: 9.4 % (ref 3.0–12.0)
Neutro Abs: 3.4 10*3/uL (ref 1.4–7.7)
Neutrophils Relative %: 55 % (ref 43.0–77.0)
Platelets: 141 10*3/uL — ABNORMAL LOW (ref 150.0–400.0)
RBC: 3.72 Mil/uL — ABNORMAL LOW (ref 4.22–5.81)
RDW: 13.5 % (ref 11.5–15.5)
WBC: 6.3 10*3/uL (ref 4.0–10.5)

## 2021-05-26 LAB — HEPATIC FUNCTION PANEL
ALT: 30 U/L (ref 0–53)
AST: 20 U/L (ref 0–37)
Albumin: 4.4 g/dL (ref 3.5–5.2)
Alkaline Phosphatase: 44 U/L (ref 39–117)
Bilirubin, Direct: 0.2 mg/dL (ref 0.0–0.3)
Total Bilirubin: 0.9 mg/dL (ref 0.2–1.2)
Total Protein: 6.5 g/dL (ref 6.0–8.3)

## 2021-05-26 LAB — LIPID PANEL
Cholesterol: 130 mg/dL (ref 0–200)
HDL: 39.5 mg/dL (ref >39.00)
NonHDL: 90.57
Total CHOL/HDL Ratio: 3
Triglycerides: 246 mg/dL — ABNORMAL HIGH (ref 0.0–149.0)
VLDL: 49.2 mg/dL — ABNORMAL HIGH (ref 0.0–40.0)

## 2021-05-26 LAB — LDL CHOLESTEROL, DIRECT: Direct LDL: 58 mg/dL

## 2021-05-26 LAB — TSH: TSH: 1.44 u[IU]/mL (ref 0.35–5.50)

## 2021-05-26 LAB — HEMOGLOBIN A1C: Hgb A1c MFr Bld: 7.2 % — ABNORMAL HIGH (ref 4.6–6.5)

## 2021-05-26 LAB — PSA: PSA: 0.73 ng/mL (ref 0.10–4.00)

## 2021-05-26 MED ORDER — LISINOPRIL 20 MG PO TABS: 10.0000 mg | ORAL_TABLET | Freq: Every day | ORAL | 3 refills | Status: DC

## 2021-05-26 MED ORDER — METFORMIN HCL 1000 MG PO TABS: 1000.0000 mg | ORAL_TABLET | Freq: Two times a day (BID) | ORAL | 3 refills | Status: DC

## 2021-05-26 MED ORDER — GABAPENTIN 300 MG PO CAPS: 300.0000 mg | ORAL_CAPSULE | Freq: Two times a day (BID) | ORAL | 3 refills | Status: DC

## 2021-05-26 MED ORDER — FINASTERIDE 5 MG PO TABS: 5.0000 mg | ORAL_TABLET | Freq: Every day | ORAL | 3 refills | Status: DC

## 2021-05-26 NOTE — Addendum Note (Signed)
Addended by: Wyvonne Lenz on: 05/26/2021 08:44 AM   Modules accepted: Orders

## 2021-05-26 NOTE — Progress Notes (Signed)
Subjective:    Patient ID: [name] [name]., male    DOB: 03-23-1946, 75 y.o.   MRN: [id number]  HPI Here for a well exam. His only complaint is the neuropathy in his feet. It hurts for him to be on his feet for any length of time. He is taking Gabapentin 100 mg BID. He saw Dr. Acie Fredrickson a few weeks ago, and he is doing very well from a cardiologic standpoint. His uveitis is stable by using Methotrexate and Humira. He also mentions a lump that appeared on his right wrist 5 weeks ago. No hx of trauma. This gets sore at times but for the most part does not bother him.    Review of Systems  Constitutional: Negative.   HENT: Negative.    Eyes: Negative.   Respiratory: Negative.    Cardiovascular: Negative.   Gastrointestinal: Negative.   Genitourinary: Negative.   Musculoskeletal: Negative.   Skin: Negative.   Neurological: Negative.   Psychiatric/Behavioral: Negative.        Objective:   Physical Exam Constitutional:      General: He is not in acute distress.    Appearance: Normal appearance. He is well-developed. He is not diaphoretic.  HENT:     Head: Normocephalic and atraumatic.     Right Ear: External ear normal.     Left Ear: External ear normal.     Nose: Nose normal.     Mouth/Throat:     Pharynx: No oropharyngeal exudate.  Eyes:     General: No scleral icterus.       Right eye: No discharge.        Left eye: No discharge.     Conjunctiva/sclera: Conjunctivae normal.     Pupils: Pupils are equal, round, and reactive to light.  Neck:     Thyroid: No thyromegaly.     Vascular: No JVD.     Trachea: No tracheal deviation.  Cardiovascular:     Rate and Rhythm: Normal rate and regular rhythm.     Heart sounds: Normal heart sounds. No murmur heard.   No friction rub. No gallop.  Pulmonary:     Effort: Pulmonary effort is normal. No respiratory distress.     Breath sounds: Normal breath sounds. No wheezing or rales.  Chest:     Chest wall: No tenderness.   Abdominal:     General: Bowel sounds are normal. There is no distension.     Palpations: Abdomen is soft. There is no mass.     Tenderness: There is no abdominal tenderness. There is no guarding or rebound.  Genitourinary:    Penis: Normal. No tenderness.      Testes: Normal.     Prostate: Normal.     Rectum: Normal. Guaiac result negative.  Musculoskeletal:        General: No tenderness. Normal range of motion.     Cervical back: Neck supple.     Comments: There is a round, smooth, mobile, slightly tender lump on the right dorsolateral wrist. The wrist has full ROM   Lymphadenopathy:     Cervical: No cervical adenopathy.  Skin:    General: Skin is warm and dry.     Coloration: Skin is not pale.     Findings: No erythema or rash.  Neurological:     Mental Status: He is alert and oriented to person, place, and time.     Cranial Nerves: No cranial nerve deficit.     Motor: No abnormal muscle  tone.     Coordination: Coordination normal.     Deep Tendon Reflexes: Reflexes are normal and symmetric. Reflexes normal.  Psychiatric:        Behavior: Behavior normal.        Thought Content: Thought content normal.        Judgment: Judgment normal.          Assessment & Plan:  Well exam. We discussed diet and exercise. Get fasting labs. He has a ganglion cyst on the wrist. We decided we would simply observe this for now. He will let us know if it becomes more worrisome. For the neuropathy we will increase the Gabapentin to 300 mg BID. Alysia Penna, MD

## 2021-05-26 NOTE — Addendum Note (Signed)
Addended by: Amanda Cockayne on: 05/26/2021 08:46 AM   Modules accepted: Orders

## 2021-05-26 NOTE — Telephone Encounter (Signed)
Patient dropped off paperwork that he would like Dr. Sarajane Jews to complete.  Patient would like paperwork faxed to [phone] once completed.  Paperwork will be placed in folder.  Please advise.

## 2021-05-27 ENCOUNTER — Telehealth: Payer: Self-pay

## 2021-05-27 ENCOUNTER — Other Ambulatory Visit: Payer: Self-pay

## 2021-05-27 MED ORDER — DAPAGLIFLOZIN PROPANEDIOL 5 MG PO TABS: 5.0000 mg | ORAL_TABLET | Freq: Every day | ORAL | 3 refills | Status: DC

## 2021-05-27 NOTE — Telephone Encounter (Signed)
Pt form has been received, awaiting completion before faxing

## 2021-05-27 NOTE — Telephone Encounter (Signed)
Pt will come by the office this afternoon to sign a form he left with Dr Sarajane Jews for his Health Assessment, form is in the front office cabinet. Please place the signed form back in Dr Sarajane Jews folder for [employer]

## 2021-06-01 NOTE — Telephone Encounter (Signed)
Pt Results Form was completed and faxed to [facility], confirmation received and pt was notified

## 2021-06-11 ENCOUNTER — Other Ambulatory Visit: Payer: Self-pay | Admitting: Family Medicine

## 2021-06-13 ENCOUNTER — Encounter: Payer: Self-pay | Admitting: Family Medicine

## 2021-06-14 NOTE — Telephone Encounter (Signed)
Noted  

## 2021-06-16 DIAGNOSIS — M5137 Other intervertebral disc degeneration, lumbosacral region: Secondary | ICD-10-CM | POA: Diagnosis not present

## 2021-06-16 DIAGNOSIS — M50323 Other cervical disc degeneration at C6-C7 level: Secondary | ICD-10-CM | POA: Diagnosis not present

## 2021-06-16 DIAGNOSIS — M9903 Segmental and somatic dysfunction of lumbar region: Secondary | ICD-10-CM | POA: Diagnosis not present

## 2021-06-16 DIAGNOSIS — M50321 Other cervical disc degeneration at C4-C5 level: Secondary | ICD-10-CM | POA: Diagnosis not present

## 2021-06-17 ENCOUNTER — Encounter: Payer: Self-pay | Admitting: Family Medicine

## 2021-06-17 ENCOUNTER — Telehealth: Payer: Self-pay

## 2021-06-17 ENCOUNTER — Ambulatory Visit (INDEPENDENT_AMBULATORY_CARE_PROVIDER_SITE_OTHER): Payer: BC Managed Care – PPO

## 2021-06-17 ENCOUNTER — Ambulatory Visit (INDEPENDENT_AMBULATORY_CARE_PROVIDER_SITE_OTHER): Payer: BC Managed Care – PPO | Admitting: Family Medicine

## 2021-06-17 ENCOUNTER — Ambulatory Visit: Payer: BC Managed Care – PPO

## 2021-06-17 ENCOUNTER — Other Ambulatory Visit: Payer: Self-pay

## 2021-06-17 VITALS — BP 104/68 | HR 84 | Temp 97.7°F | Wt 186.0 lb

## 2021-06-17 DIAGNOSIS — M7731 Calcaneal spur, right foot: Secondary | ICD-10-CM | POA: Diagnosis not present

## 2021-06-17 DIAGNOSIS — M1A9XX0 Chronic gout, unspecified, without tophus (tophi): Secondary | ICD-10-CM | POA: Diagnosis not present

## 2021-06-17 DIAGNOSIS — M79672 Pain in left foot: Secondary | ICD-10-CM | POA: Diagnosis not present

## 2021-06-17 DIAGNOSIS — M79671 Pain in right foot: Secondary | ICD-10-CM

## 2021-06-17 DIAGNOSIS — M19071 Primary osteoarthritis, right ankle and foot: Secondary | ICD-10-CM | POA: Diagnosis not present

## 2021-06-17 DIAGNOSIS — M19072 Primary osteoarthritis, left ankle and foot: Secondary | ICD-10-CM | POA: Diagnosis not present

## 2021-06-17 DIAGNOSIS — M159 Polyosteoarthritis, unspecified: Secondary | ICD-10-CM

## 2021-06-17 DIAGNOSIS — M1990 Unspecified osteoarthritis, unspecified site: Secondary | ICD-10-CM | POA: Insufficient documentation

## 2021-06-17 LAB — URIC ACID: Uric Acid, Serum: 7.2 mg/dL (ref 4.0–7.8)

## 2021-06-17 MED ORDER — COLCHICINE 0.6 MG PO TABS: 0.6000 mg | ORAL_TABLET | Freq: Four times a day (QID) | ORAL | 5 refills | Status: DC | PRN

## 2021-06-17 MED ORDER — MELOXICAM 15 MG PO TABS: 15.0000 mg | ORAL_TABLET | Freq: Every day | ORAL | 3 refills | Status: DC

## 2021-06-17 MED ORDER — TADALAFIL 20 MG PO TABS
20.0000 mg | ORAL_TABLET | Freq: Every day | ORAL | 11 refills | Status: DC | PRN
Start: 2021-06-17 — End: 2021-08-18

## 2021-06-17 NOTE — Telephone Encounter (Signed)
Pt PA for tadalafil was sent to plan for approval (Key: [id number])

## 2021-06-17 NOTE — Progress Notes (Signed)
   Subjective:    Patient ID: [name] [name]., male    DOB: 09-Aug-1946, 75 y.o.   MRN: [id number]  HPI Here to discuss arthritis and gout. He saw a rheumatologist who treated his gout 20 years ago, but her stopped because the gout seemed to go away for a long time. Now for the past 6 months he has been getting gout flares again. These mostly involved the left great toe. It swell and becomes very painful at times. In addition, he complains of a milder sharp pain he gets in multiple joints including the MTP joints of both feet and the fingers on both hands. He takes Ibuprofen for this but it does not help much.    Review of Systems  Constitutional: Negative.   Respiratory: Negative.    Cardiovascular: Negative.   Musculoskeletal:  Positive for arthralgias.      Objective:   Physical Exam Constitutional:      Appearance: Normal appearance.  Cardiovascular:     Rate and Rhythm: Normal rate and regular rhythm.     Pulses: Normal pulses.     Heart sounds: Normal heart sounds.  Pulmonary:     Effort: Pulmonary effort is normal.     Breath sounds: Normal breath sounds.  Musculoskeletal:     Comments: He has tenderness and swelling most of the PIP's and DIP's of both hands. He is also very tender over the left great toe MTP joint   Neurological:     Mental Status: He is alert.          Assessment & Plan:  He has some underlying osteoarthritis in multiple joints, and he has flares of gout in the feet in top of that. We will check a uric acid level today and we will get Xrays of both feet. He will start taking Meloxicam every day for the OA, and he will try Colchicine for gout flares.  Alysia Penna, MD

## 2021-06-18 ENCOUNTER — Other Ambulatory Visit: Payer: Self-pay

## 2021-06-18 MED ORDER — ALLOPURINOL 100 MG PO TABS: 100.0000 mg | ORAL_TABLET | Freq: Every day | ORAL | 3 refills | Status: DC

## 2021-06-20 ENCOUNTER — Other Ambulatory Visit: Payer: Self-pay | Admitting: Family Medicine

## 2021-06-21 ENCOUNTER — Encounter: Payer: Self-pay | Admitting: Family Medicine

## 2021-06-22 NOTE — Telephone Encounter (Signed)
He is already on one of the best arthritis medications (Meloxicam). As for the feet, he may benefit from seeing a Podiatrist to make custom inserts to put in the shoes. I can refer him if he wishes

## 2021-07-05 DIAGNOSIS — M79675 Pain in left toe(s): Secondary | ICD-10-CM | POA: Diagnosis not present

## 2021-07-05 DIAGNOSIS — M19072 Primary osteoarthritis, left ankle and foot: Secondary | ICD-10-CM | POA: Diagnosis not present

## 2021-07-12 DIAGNOSIS — H44113 Panuveitis, bilateral: Secondary | ICD-10-CM | POA: Diagnosis not present

## 2021-07-12 DIAGNOSIS — H30033 Focal chorioretinal inflammation, peripheral, bilateral: Secondary | ICD-10-CM | POA: Diagnosis not present

## 2021-07-12 DIAGNOSIS — Z79899 Other long term (current) drug therapy: Secondary | ICD-10-CM | POA: Diagnosis not present

## 2021-07-13 DIAGNOSIS — M19072 Primary osteoarthritis, left ankle and foot: Secondary | ICD-10-CM | POA: Diagnosis not present

## 2021-07-13 DIAGNOSIS — M79675 Pain in left toe(s): Secondary | ICD-10-CM | POA: Diagnosis not present

## 2021-07-14 ENCOUNTER — Encounter: Payer: Self-pay | Admitting: Family Medicine

## 2021-07-14 DIAGNOSIS — M67431 Ganglion, right wrist: Secondary | ICD-10-CM

## 2021-07-16 NOTE — Telephone Encounter (Signed)
His Orthopedist can do this

## 2021-07-20 DIAGNOSIS — H30033 Focal chorioretinal inflammation, peripheral, bilateral: Secondary | ICD-10-CM | POA: Diagnosis not present

## 2021-07-20 DIAGNOSIS — Z961 Presence of intraocular lens: Secondary | ICD-10-CM | POA: Diagnosis not present

## 2021-07-20 DIAGNOSIS — L570 Actinic keratosis: Secondary | ICD-10-CM | POA: Diagnosis not present

## 2021-07-20 DIAGNOSIS — H3581 Retinal edema: Secondary | ICD-10-CM | POA: Diagnosis not present

## 2021-07-20 DIAGNOSIS — E119 Type 2 diabetes mellitus without complications: Secondary | ICD-10-CM | POA: Diagnosis not present

## 2021-07-21 DIAGNOSIS — M50323 Other cervical disc degeneration at C6-C7 level: Secondary | ICD-10-CM | POA: Diagnosis not present

## 2021-07-21 DIAGNOSIS — M9903 Segmental and somatic dysfunction of lumbar region: Secondary | ICD-10-CM | POA: Diagnosis not present

## 2021-07-21 DIAGNOSIS — M5137 Other intervertebral disc degeneration, lumbosacral region: Secondary | ICD-10-CM | POA: Diagnosis not present

## 2021-07-21 DIAGNOSIS — M79675 Pain in left toe(s): Secondary | ICD-10-CM | POA: Diagnosis not present

## 2021-07-21 DIAGNOSIS — M19072 Primary osteoarthritis, left ankle and foot: Secondary | ICD-10-CM | POA: Diagnosis not present

## 2021-07-21 DIAGNOSIS — M50321 Other cervical disc degeneration at C4-C5 level: Secondary | ICD-10-CM | POA: Diagnosis not present

## 2021-07-22 NOTE — Addendum Note (Signed)
Addended by: Alysia Penna A on: 07/22/2021 12:52 PM   Modules accepted: Orders

## 2021-07-22 NOTE — Telephone Encounter (Signed)
I ddi the referral to Orthopedics

## 2021-07-28 DIAGNOSIS — M79675 Pain in left toe(s): Secondary | ICD-10-CM | POA: Diagnosis not present

## 2021-07-28 DIAGNOSIS — M19072 Primary osteoarthritis, left ankle and foot: Secondary | ICD-10-CM | POA: Diagnosis not present

## 2021-07-29 DIAGNOSIS — H6123 Impacted cerumen, bilateral: Secondary | ICD-10-CM | POA: Diagnosis not present

## 2021-07-30 ENCOUNTER — Ambulatory Visit (INDEPENDENT_AMBULATORY_CARE_PROVIDER_SITE_OTHER): Payer: BC Managed Care – PPO | Admitting: Orthopedic Surgery

## 2021-07-30 ENCOUNTER — Encounter: Payer: Self-pay | Admitting: Orthopedic Surgery

## 2021-07-30 ENCOUNTER — Other Ambulatory Visit: Payer: Self-pay

## 2021-07-30 DIAGNOSIS — M67431 Ganglion, right wrist: Secondary | ICD-10-CM | POA: Diagnosis not present

## 2021-07-30 NOTE — Progress Notes (Signed)
Office Visit Note   Patient: [name] [name].           Date of Birth: 1945-12-13           MRN: [id number] Visit Date: 07/30/2021              Requested by: Laurey Morale, MD [place],  [place] [id number] PCP: Laurey Morale, MD   Assessment & Plan: Visit Diagnoses:  1. Ganglion of right wrist     Plan: We discussed the diagnosis, prognosis, non-operative and operative treatment options for ganglion cysts.  After our discussion, the patient would like to proceed with aspiration of the cyst.  We reviewed the risks and benefits of conservative management including cyst recurrence.  The patient expressed understanding of the reasoning and strategy going forward.  All patient questions and concerns were addressed.   Approximately 3 cc of mucinous fluid was aspirated with complete deflation of the cyst.   Follow-Up Instructions: No follow-ups on file.   Orders:  No orders of the defined types were placed in this encounter.  No orders of the defined types were placed in this encounter.     Procedures: Hand/UE Inj: R wrist for dorsal carpal ganglion on 07/30/2021 9:04 AM Details: ulnar approach Aspirate: clear Outcome: tolerated well, no immediate complications Procedure, treatment alternatives, risks and benefits explained, specific risks discussed. Consent was given by the patient. Patient was prepped and draped in the usual sterile fashion.      Clinical Data: No additional findings.   Subjective: Chief Complaint  Patient presents with   Right Wrist - Ganglion Cyst    Right handed, Pain: .5/10---noticed it 2-3 months ago, size will go up and down, been this size for about 5 weeks now    This is a 75 yo RHD M who presents with a painless mass at the dorsal ulnar aspect of the wrist.  It has been present for 2-3 months.  It will occasionally increase and decrease in size.  He denies any numbness or paresthesias in the hand.     Review of  Systems   Objective: Vital Signs: BP 96/62 (BP Location: Left Arm, Patient Position: Sitting)   Pulse 76   Ht 6\' 2"  (1.88 m)   Wt 186 lb (84.4 kg)   BMI 23.88 kg/m   Physical Exam Constitutional:      Appearance: Normal appearance.  Cardiovascular:     Rate and Rhythm: Normal rate.     Pulses: Normal pulses.  Pulmonary:     Effort: Pulmonary effort is normal.  Skin:    General: Skin is warm and dry.     Capillary Refill: Capillary refill takes less than 2 seconds.  Neurological:     Mental Status: He is alert.    Right Hand Exam   Tenderness  The patient is experiencing no tenderness.   Range of Motion  The patient has normal right wrist ROM.   Muscle Strength  The patient has normal right wrist strength.  Other  Erythema: absent Sensation: normal Pulse: present  Comments:  Approx 2.5 x 2.5 cm mass at dorsal ulnar aspect of wrist.  Mass is firm, well circumscribed, and superficial.  It transilluminates.      Specialty Comments:  No specialty comments available.  Imaging: No results found.   PMFS History: Patient Active Problem List   Diagnosis Date Noted   Ganglion of right wrist 07/30/2021   OA (osteoarthritis) 06/17/2021  COVID-19 virus infection 02/17/2021   Neuropathy 12/09/2020   Uveitis of both eyes 05/21/2020   Type 2 diabetes mellitus without complication, without long-term current use of insulin ([place]) 07/26/2017   Palpitations 02/07/2012   OTHER TENOSYNOVITIS OF HAND AND WRIST 06/01/2008   Dyslipidemia 03/05/2007   GOUT 03/05/2007   Essential hypertension 03/05/2007   DIVERTICULOSIS, COLON 03/05/2007   COLONIC POLYPS, BENIGN, HX OF 03/05/2007   BPH with urinary obstruction 03/05/2007   Past Medical History:  Diagnosis Date   Cataract    Bil/ right eye surgery   Diabetes mellitus    type II   Diverticulitis of colon    ED (erectile dysfunction)    Gout    no meds   H/O echocardiogram 2013   normal    History of irregular  heartbeat    Per pt, heart skips beats at times/had Echo   Hyperlipidemia    Hypertension    Iritis, recurrent     sees Dr. Silvestre Gunner at [facility]    Normal cardiac stress test 09/27/2002   Palpitations    PVCs and PACs per event monitor   Post-operative nausea and vomiting    Uveitis    sees Dr Polly Cobia at [facility] Dr Manuella Ghazi now    Family History  Problem Relation Age of Onset   Heart attack Father 57   Kidney disease Father    Heart disease Father    Colon cancer Father    Thyroid cancer Mother    Heart disease Mother    Heart attack Paternal Grandmother    Thyroid cancer Brother    Diabetes Paternal Grandfather    Stomach cancer Neg Hx     Past Surgical History:  Procedure Laterality Date   APPENDECTOMY     CATARACT EXTRACTION     Dr Katy Fitch , left eye/ 2 years ago   CATARACT EXTRACTION  03/16/2017   Right eye   COLONOSCOPY  04/20/2017   per Dr. Loletha Carrow, adenomatous polyps, repeat in 5 yrs    SHOULDER ARTHROSCOPY     both shoulders    TONSILLECTOMY     TONSILLECTOMY AND ADENOIDECTOMY     tubes in ears     41 years ago   Social History   Occupational History   Occupation: Nurse, learning disability  Tobacco Use   Smoking status: Former    Packs/day: 1.00    Years: 8.00    Pack years: 8.00    Types: Cigarettes    Quit date: 02/06/1970    Years since quitting: 51.5   Smokeless tobacco: Never  Substance and Sexual Activity   Alcohol use: Yes    Alcohol/week: 7.0 standard drinks    Types: 7 Standard drinks or equivalent per week    Comment: glass of wine most days   Drug use: No   Sexual activity: Not on file

## 2021-08-04 DIAGNOSIS — M19072 Primary osteoarthritis, left ankle and foot: Secondary | ICD-10-CM | POA: Diagnosis not present

## 2021-08-04 DIAGNOSIS — M79675 Pain in left toe(s): Secondary | ICD-10-CM | POA: Diagnosis not present

## 2021-08-10 ENCOUNTER — Other Ambulatory Visit: Payer: Self-pay | Admitting: Family Medicine

## 2021-08-17 ENCOUNTER — Telehealth: Payer: Self-pay | Admitting: Family Medicine

## 2021-08-17 NOTE — Telephone Encounter (Signed)
Patient calling in with respiratory symptoms: Shortness of breath, chest pain, palpitations or other red words send to Triage  Does the patient have a fever over 100, cough, congestion, sore throat, runny nose, lost of taste/smell within the last 5 days (please list symptoms that patient has)? For the past 11 to 12 days patient has been sick. Started as sore throat, but now is just cough and congestion. Has tested negative twice  Have you tested for Covid in the last 5 days? YES  If yes, was it positive OR negative [x] ? If positive in the last 5 days, please schedule virtual visit now. If negative, schedule for an in person OV with the next available provider if PCP has no openings. Please also let patient know they will be tested again (follow the script below)  "you will have to arrive 3mins prior to your appt time to be Covid tested. Please park in back of office at the cone & call [phone] to let the staff know you have arrived. A staff member will meet you at your car to do a rapid covid test. Once the test has resulted you will be notified by phone of your results to determine if appt will remain an in person visit or be converted to a virtual/phone visit. If you arrive less than 57mins before your appt time, your visit will be automatically converted to virtual & any recommended testing will happen AFTER the visit."   [place]  If no availability for virtual visit in office,  please schedule another [place] office  If no availability at another [place] office, please instruct patient that they can schedule an evisit or virtual visit through their mychart account. Visits up to 8pm  patients can be seen in office 5 days after positive COVID test

## 2021-08-18 ENCOUNTER — Ambulatory Visit (INDEPENDENT_AMBULATORY_CARE_PROVIDER_SITE_OTHER): Payer: BC Managed Care – PPO | Admitting: Family Medicine

## 2021-08-18 ENCOUNTER — Encounter: Payer: Self-pay | Admitting: Family Medicine

## 2021-08-18 VITALS — BP 108/62 | HR 88 | Temp 98.8°F | Wt 183.0 lb

## 2021-08-18 DIAGNOSIS — J019 Acute sinusitis, unspecified: Secondary | ICD-10-CM

## 2021-08-18 MED ORDER — AZITHROMYCIN 250 MG PO TABS: ORAL_TABLET | ORAL | 0 refills | Status: DC

## 2021-08-18 NOTE — Progress Notes (Signed)
   Subjective:    Patient ID: [name] [name]., male    DOB: 09/01/1946, 75 y.o.   MRN: [id number]  HPI Here for 10 days of stuffy head, PND, a dry cough, and blowing bloody mucus from the nose. No fever or SOB. No body aches. He tested negative for the Covid-19 virus. He is taking Robitussin and using saline nasal sprays.    Review of Systems  Constitutional: Negative.   HENT:  Positive for congestion, postnasal drip and sinus pressure. Negative for sore throat.   Eyes: Negative.   Respiratory:  Positive for cough.   Gastrointestinal: Negative.       Objective:   Physical Exam Constitutional:      Appearance: Normal appearance. He is not ill-appearing.  HENT:     Right Ear: Tympanic membrane, ear canal and external ear normal.     Left Ear: Tympanic membrane, ear canal and external ear normal.     Nose: Nose normal.     Mouth/Throat:     Pharynx: Oropharynx is clear.  Eyes:     Conjunctiva/sclera: Conjunctivae normal.  Cardiovascular:     Rate and Rhythm: Normal rate and regular rhythm.     Pulses: Normal pulses.     Heart sounds: Normal heart sounds.  Pulmonary:     Effort: Pulmonary effort is normal.     Breath sounds: Normal breath sounds.  Lymphadenopathy:     Cervical: No cervical adenopathy.  Neurological:     Mental Status: He is alert.          Assessment & Plan:  Sinusitis, treat with a Zpack.  Alysia Penna, MD

## 2021-08-23 DIAGNOSIS — M50321 Other cervical disc degeneration at C4-C5 level: Secondary | ICD-10-CM | POA: Diagnosis not present

## 2021-08-23 DIAGNOSIS — M5137 Other intervertebral disc degeneration, lumbosacral region: Secondary | ICD-10-CM | POA: Diagnosis not present

## 2021-08-23 DIAGNOSIS — M50323 Other cervical disc degeneration at C6-C7 level: Secondary | ICD-10-CM | POA: Diagnosis not present

## 2021-08-23 DIAGNOSIS — M9903 Segmental and somatic dysfunction of lumbar region: Secondary | ICD-10-CM | POA: Diagnosis not present

## 2021-08-25 ENCOUNTER — Encounter: Payer: Self-pay | Admitting: Family Medicine

## 2021-08-25 ENCOUNTER — Ambulatory Visit (INDEPENDENT_AMBULATORY_CARE_PROVIDER_SITE_OTHER): Payer: BC Managed Care – PPO | Admitting: Family Medicine

## 2021-08-25 ENCOUNTER — Other Ambulatory Visit: Payer: Self-pay | Admitting: Family Medicine

## 2021-08-25 VITALS — BP 110/68 | HR 61 | Temp 98.2°F | Wt 182.0 lb

## 2021-08-25 DIAGNOSIS — K219 Gastro-esophageal reflux disease without esophagitis: Secondary | ICD-10-CM

## 2021-08-25 DIAGNOSIS — E119 Type 2 diabetes mellitus without complications: Secondary | ICD-10-CM | POA: Diagnosis not present

## 2021-08-25 LAB — POCT GLYCOSYLATED HEMOGLOBIN (HGB A1C): Hemoglobin A1C: 6.9 % — AB (ref 4.0–5.6)

## 2021-08-25 MED ORDER — OMEPRAZOLE 40 MG PO CPDR: 40.0000 mg | DELAYED_RELEASE_CAPSULE | Freq: Every day | ORAL | 5 refills | Status: DC

## 2021-08-25 NOTE — Progress Notes (Signed)
° °  Subjective:    Patient ID: [name] [name]., male    DOB: 1946/02/21, 75 y.o.   MRN: [id number]  HPI Here for 3 weeks of frequent upper abdominal pains with some heartburn. He has also ben producing more flatus than usual. No fever or nausea. He takes Mylanta several times a day, and this helps briefly.    Review of Systems  Constitutional: Negative.   Respiratory: Negative.    Cardiovascular: Negative.   Gastrointestinal:  Positive for abdominal pain. Negative for abdominal distention, anal bleeding, blood in stool, constipation, diarrhea, nausea and vomiting.      Objective:   Physical Exam Constitutional:      General: He is not in acute distress.    Appearance: Normal appearance.  Cardiovascular:     Rate and Rhythm: Normal rate and regular rhythm.     Pulses: Normal pulses.     Heart sounds: Normal heart sounds.  Pulmonary:     Effort: Pulmonary effort is normal.     Breath sounds: Normal breath sounds.  Abdominal:     General: Abdomen is flat. Bowel sounds are normal. There is no distension.     Palpations: Abdomen is soft. There is no mass.     Tenderness: There is no abdominal tenderness. There is no guarding or rebound.     Hernia: No hernia is present.  Neurological:     Mental Status: He is alert.          Assessment & Plan:  GERD, he will try Omeprazole 40 mg every morning.  Alysia Penna, MD

## 2021-08-31 DIAGNOSIS — H9193 Unspecified hearing loss, bilateral: Secondary | ICD-10-CM | POA: Diagnosis not present

## 2021-08-31 DIAGNOSIS — H903 Sensorineural hearing loss, bilateral: Secondary | ICD-10-CM | POA: Diagnosis not present

## 2021-09-01 DIAGNOSIS — M5137 Other intervertebral disc degeneration, lumbosacral region: Secondary | ICD-10-CM | POA: Diagnosis not present

## 2021-09-01 DIAGNOSIS — M9903 Segmental and somatic dysfunction of lumbar region: Secondary | ICD-10-CM | POA: Diagnosis not present

## 2021-09-01 DIAGNOSIS — M50323 Other cervical disc degeneration at C6-C7 level: Secondary | ICD-10-CM | POA: Diagnosis not present

## 2021-09-01 DIAGNOSIS — M50321 Other cervical disc degeneration at C4-C5 level: Secondary | ICD-10-CM | POA: Diagnosis not present

## 2021-09-02 DIAGNOSIS — L820 Inflamed seborrheic keratosis: Secondary | ICD-10-CM | POA: Diagnosis not present

## 2021-09-02 DIAGNOSIS — D1801 Hemangioma of skin and subcutaneous tissue: Secondary | ICD-10-CM | POA: Diagnosis not present

## 2021-09-02 DIAGNOSIS — Z85828 Personal history of other malignant neoplasm of skin: Secondary | ICD-10-CM | POA: Diagnosis not present

## 2021-09-02 DIAGNOSIS — L570 Actinic keratosis: Secondary | ICD-10-CM | POA: Diagnosis not present

## 2021-09-02 DIAGNOSIS — D225 Melanocytic nevi of trunk: Secondary | ICD-10-CM | POA: Diagnosis not present

## 2021-09-02 DIAGNOSIS — L821 Other seborrheic keratosis: Secondary | ICD-10-CM | POA: Diagnosis not present

## 2021-09-07 ENCOUNTER — Other Ambulatory Visit: Payer: Self-pay | Admitting: Family Medicine

## 2021-09-22 DIAGNOSIS — M9903 Segmental and somatic dysfunction of lumbar region: Secondary | ICD-10-CM | POA: Diagnosis not present

## 2021-09-22 DIAGNOSIS — M50321 Other cervical disc degeneration at C4-C5 level: Secondary | ICD-10-CM | POA: Diagnosis not present

## 2021-09-22 DIAGNOSIS — M5137 Other intervertebral disc degeneration, lumbosacral region: Secondary | ICD-10-CM | POA: Diagnosis not present

## 2021-09-22 DIAGNOSIS — M50323 Other cervical disc degeneration at C6-C7 level: Secondary | ICD-10-CM | POA: Diagnosis not present

## 2021-09-28 ENCOUNTER — Ambulatory Visit (INDEPENDENT_AMBULATORY_CARE_PROVIDER_SITE_OTHER): Payer: BC Managed Care – PPO | Admitting: Orthopedic Surgery

## 2021-09-28 ENCOUNTER — Other Ambulatory Visit: Payer: Self-pay

## 2021-09-28 DIAGNOSIS — M67431 Ganglion, right wrist: Secondary | ICD-10-CM

## 2021-09-28 NOTE — Progress Notes (Signed)
Office Visit Note   Patient: [name] [name].           Date of Birth: [birth year]           MRN: [id number] Visit Date: 09/28/2021              Requested by: Laurey Morale, MD [place],  [place] [address] PCP: Laurey Morale, MD   Assessment & Plan: Visit Diagnoses:  1. Ganglion of right wrist     Plan: Patient has recurrent ganglion cyst at the dorsal and ulnar aspect of the wrist that is failed aspiration.  This was aspirated approximately 2 months ago.  He has minimal symptoms but the mass seems to be enlarging.  He is interested in surgical excision.  Discussed I would like to get an ultrasound or MRI to further evaluate the cyst and see where it is coming from so I can more appropriately excise it and minimize the likelihood of recurrence.  He will let me know once the study is completed and he and I will talk over the phone about the results.  Follow-Up Instructions: No follow-ups on file.   Orders:  No orders of the defined types were placed in this encounter.  No orders of the defined types were placed in this encounter.     Procedures: No procedures performed   Clinical Data: No additional findings.   Subjective: Chief Complaint  Patient presents with   Right Hand - Follow-up, Cyst    States that the knot on lateral side of hand never went down after last aspiration, now has more fluid in it    Is a 76 year old right-hand-dominant male who presents for follow-up of a painless mass to the dorsal ulnar aspect of the right wrist.  This been present since about August or September.  It has increased and decreased in size over time.  He had the mass aspirated my office on 07/30/2021 with subsequent recurrence.  He is interested in discussing surgical excision today.   Review of Systems   Objective: Vital Signs: There were no vitals taken for this visit.  Physical Exam Constitutional:      Appearance: Normal appearance.   Cardiovascular:     Rate and Rhythm: Normal rate.     Pulses: Normal pulses.  Pulmonary:     Effort: Pulmonary effort is normal.  Skin:    General: Skin is warm and dry.     Capillary Refill: Capillary refill takes less than 2 seconds.  Neurological:     Mental Status: He is alert.    Right Hand Exam   Tenderness  The patient is experiencing no tenderness.   Range of Motion  The patient has normal right wrist ROM.   Other  Erythema: absent Sensation: normal Pulse: present  Comments:  Approximately 2x2 cm, firm, well circumscribed mass at dorsal ulnar aspect of wrist distal to ulnar head.  Mass transilluminates.  No associated numbness or paresthesias.  Mass is non pulsatile.      Specialty Comments:  No specialty comments available.  Imaging: No results found.   PMFS History: Patient Active Problem List   Diagnosis Date Noted   Ganglion of right wrist 07/30/2021   OA (osteoarthritis) 06/17/2021   COVID-19 virus infection 02/17/2021   Neuropathy 12/09/2020   Uveitis of both eyes 05/21/2020   Type 2 diabetes mellitus without complication, without long-term current use of insulin ([place]) 07/26/2017   Palpitations 02/07/2012   OTHER TENOSYNOVITIS  OF HAND AND WRIST 06/01/2008   Dyslipidemia 03/05/2007   GOUT 03/05/2007   Essential hypertension 03/05/2007   DIVERTICULOSIS, COLON 03/05/2007   COLONIC POLYPS, BENIGN, HX OF 03/05/2007   BPH with urinary obstruction 03/05/2007   Past Medical History:  Diagnosis Date   Cataract    Bil/ right eye surgery   Diabetes mellitus    type II   Diverticulitis of colon    ED (erectile dysfunction)    Gout    no meds   H/O echocardiogram 2013   normal    History of irregular heartbeat    Per pt, heart skips beats at times/had Echo   Hyperlipidemia    Hypertension    Iritis, recurrent     sees Dr. Silvestre Gunner at [facility]    Normal cardiac stress test 09/27/2002   Palpitations    PVCs and PACs per event monitor    Post-operative nausea and vomiting    Uveitis    sees Dr Polly Cobia at [facility] Dr Manuella Ghazi now    Family History  Problem Relation Age of Onset   Heart attack Father 68   Kidney disease Father    Heart disease Father    Colon cancer Father    Thyroid cancer Mother    Heart disease Mother    Heart attack Paternal Grandmother    Thyroid cancer Brother    Diabetes Paternal Grandfather    Stomach cancer Neg Hx     Past Surgical History:  Procedure Laterality Date   APPENDECTOMY     CATARACT EXTRACTION     Dr Katy Fitch , left eye/ 2 years ago   CATARACT EXTRACTION  03/16/2017   Right eye   COLONOSCOPY  04/20/2017   per Dr. Loletha Carrow, adenomatous polyps, repeat in 5 yrs    SHOULDER ARTHROSCOPY     both shoulders    TONSILLECTOMY     TONSILLECTOMY AND ADENOIDECTOMY     tubes in ears     77 years ago   Social History   Occupational History   Occupation: Nurse, learning disability  Tobacco Use   Smoking status: Former    Packs/day: 1.00    Years: 8.00    Pack years: 8.00    Types: Cigarettes    Quit date: 02/06/1970    Years since quitting: 51.6   Smokeless tobacco: Never  Substance and Sexual Activity   Alcohol use: Yes    Alcohol/week: 7.0 standard drinks    Types: 7 Standard drinks or equivalent per week    Comment: glass of wine most days   Drug use: No   Sexual activity: Not on file

## 2021-09-28 NOTE — Addendum Note (Signed)
Addended by: Sherilyn Cooter on: 09/28/2021 09:48 AM   Modules accepted: Orders

## 2021-10-05 ENCOUNTER — Other Ambulatory Visit: Payer: Self-pay | Admitting: Family Medicine

## 2021-10-05 NOTE — Telephone Encounter (Signed)
Pt LOV was on 08/25/2021 Last refill done on 04/19/2021 Please advise

## 2021-10-08 ENCOUNTER — Other Ambulatory Visit: Payer: Self-pay | Admitting: Family Medicine

## 2021-10-18 DIAGNOSIS — H30033 Focal chorioretinal inflammation, peripheral, bilateral: Secondary | ICD-10-CM | POA: Diagnosis not present

## 2021-10-18 DIAGNOSIS — Z79899 Other long term (current) drug therapy: Secondary | ICD-10-CM | POA: Diagnosis not present

## 2021-10-20 DIAGNOSIS — M9903 Segmental and somatic dysfunction of lumbar region: Secondary | ICD-10-CM | POA: Diagnosis not present

## 2021-10-20 DIAGNOSIS — M5137 Other intervertebral disc degeneration, lumbosacral region: Secondary | ICD-10-CM | POA: Diagnosis not present

## 2021-10-20 DIAGNOSIS — M50321 Other cervical disc degeneration at C4-C5 level: Secondary | ICD-10-CM | POA: Diagnosis not present

## 2021-10-20 DIAGNOSIS — M50323 Other cervical disc degeneration at C6-C7 level: Secondary | ICD-10-CM | POA: Diagnosis not present

## 2021-10-21 ENCOUNTER — Ambulatory Visit
Admission: RE | Admit: 2021-10-21 | Discharge: 2021-10-21 | Disposition: A | Discharge status: Home / Self Care | Payer: BC Managed Care – PPO | Source: Ambulatory Visit | Attending: Orthopedic Surgery | Admitting: Orthopedic Surgery

## 2021-10-21 DIAGNOSIS — M67431 Ganglion, right wrist: Secondary | ICD-10-CM

## 2021-10-26 DIAGNOSIS — H3581 Retinal edema: Secondary | ICD-10-CM | POA: Diagnosis not present

## 2021-10-26 DIAGNOSIS — E119 Type 2 diabetes mellitus without complications: Secondary | ICD-10-CM | POA: Diagnosis not present

## 2021-10-26 DIAGNOSIS — H30033 Focal chorioretinal inflammation, peripheral, bilateral: Secondary | ICD-10-CM | POA: Diagnosis not present

## 2021-10-26 DIAGNOSIS — Z961 Presence of intraocular lens: Secondary | ICD-10-CM | POA: Diagnosis not present

## 2021-10-28 ENCOUNTER — Telehealth: Payer: Self-pay | Admitting: Orthopedic Surgery

## 2021-10-28 NOTE — Telephone Encounter (Signed)
Pt called wanting to speak about his MRI results. He states benfield told him he would call him about the results. He will be leaving to go out of town tomorrow so would really like a call today.   CB [phone]

## 2021-11-15 ENCOUNTER — Telehealth: Payer: Self-pay

## 2021-11-15 NOTE — Telephone Encounter (Signed)
Patient called into the office to advise that his cyst has grown and he wanted to know if he needed another apt to be seen or what the next steps would be. Please advise  ?

## 2021-11-15 NOTE — Telephone Encounter (Signed)
Contacted patient and he has been scheduled to see you in the office on 11/16/2021 at 2:30PM. ?

## 2021-11-16 ENCOUNTER — Other Ambulatory Visit: Payer: Self-pay

## 2021-11-16 ENCOUNTER — Ambulatory Visit (INDEPENDENT_AMBULATORY_CARE_PROVIDER_SITE_OTHER): Payer: BC Managed Care – PPO | Admitting: Orthopedic Surgery

## 2021-11-16 DIAGNOSIS — M67431 Ganglion, right wrist: Secondary | ICD-10-CM

## 2021-11-16 NOTE — H&P (View-Only) (Signed)
? ?Office Visit Note ?  ?Patient: [name] [name].           ?Date of Birth: 01-03-1946           ?MRN: [id number] ?Visit Date: 11/16/2021 ?             ?Requested by: Laurey Morale, MD ?[address] ?[place],  [place] [address] ?PCP: Laurey Morale, MD ? ? ?Assessment & Plan: ?Visit Diagnoses:  ?1. Ganglion of right wrist   ? ? ?Plan: Patient notes that the ganglion cyst at the ulnar aspect of the right wrist has gotten bigger since it was aspirated back in November.  An MRI was obtained to try to find the origin of the cyst.  It was unclear where the cyst originates based on the imaging but there is a suggestion that it comes from the Pisotriquetral joint.  Patient is interested in discussing surgical management of the cyst given the recent increase in size and failed aspiration.  We discussed the risks of cyst excision including bleeding, infection, damage to nearby blood vessels nerves including the dorsal sensory branch of the ulnar nerve, recurrence, need for additional procedures.  Patient understands risks and wants to proceed with surgery.  A surgical date will be confirmed with the patient. ? ?Follow-Up Instructions: No follow-ups on file.  ? ?Orders:  ?No orders of the defined types were placed in this encounter. ? ?No orders of the defined types were placed in this encounter. ? ? ? ? Procedures: ?No procedures performed ? ? ?Clinical Data: ?No additional findings. ? ? ?Subjective: ?Chief Complaint  ?Patient presents with  ? Right Wrist - Follow-up  ?  Cyst is getting larger  ? ? ?This is a 76 year old right-hand-dominant male presents for follow-up of a ganglion cyst to the ulnar aspect of the right wrist.  This was first noticed in August or September.  I saw him in November where the cyst was aspirated and 3 cc of mucinous fluid was obtained.  That has since recurred and actually gotten larger since the aspiration.  An MRI was obtained which demonstrated a ganglion cyst that likely  originates from the pisotriquetral joint.  It is largely asymptomatic in terms of pain and decreased function.  He is interested in discussing surgery today given the recurrence following aspiration ? ? ?Review of Systems ? ? ?Objective: ?Vital Signs: There were no vitals taken for this visit. ? ?Physical Exam ?Constitutional:   ?   Appearance: Normal appearance.  ?Cardiovascular:  ?   Rate and Rhythm: Normal rate.  ?   Pulses: Normal pulses.  ?Pulmonary:  ?   Effort: Pulmonary effort is normal.  ?Skin: ?   General: Skin is warm and dry.  ?   Capillary Refill: Capillary refill takes less than 2 seconds.  ?Neurological:  ?   Mental Status: He is alert.  ? ? ?Right Hand Exam  ? ?Tenderness  ?The patient is experiencing no tenderness.  ? ?Range of Motion  ?The patient has normal right wrist ROM.  ? ?Other  ?Erythema: absent ?Sensation: normal ?Pulse: present ? ?Comments:  Large cystic mass at ulnar aspect of wrist around the lateral apect of the ulnar head.  Mass is superficial, firm, mobile, approximately 3 cm proximal to distal and approximately 2 cm in width.  ? ? ? ? ?Specialty Comments:  ?No specialty comments available. ? ?Imaging: ?No results found. ? ? ?PMFS History: ?Patient Active Problem List  ? Diagnosis Date  Noted  ? Ganglion of right wrist 07/30/2021  ? OA (osteoarthritis) 06/17/2021  ? COVID-19 virus infection 02/17/2021  ? Neuropathy 12/09/2020  ? Uveitis of both eyes 05/21/2020  ? Type 2 diabetes mellitus without complication, without long-term current use of insulin ([place]) 07/26/2017  ? Palpitations 02/07/2012  ? OTHER TENOSYNOVITIS OF HAND AND WRIST 06/01/2008  ? Dyslipidemia 03/05/2007  ? GOUT 03/05/2007  ? Essential hypertension 03/05/2007  ? DIVERTICULOSIS, COLON 03/05/2007  ? COLONIC POLYPS, BENIGN, HX OF 03/05/2007  ? BPH with urinary obstruction 03/05/2007  ? ?Past Medical History:  ?Diagnosis Date  ? Cataract   ? Bil/ right eye surgery  ? Diabetes mellitus   ? type II  ? Diverticulitis of colon    ? ED (erectile dysfunction)   ? Gout   ? no meds  ? H/O echocardiogram 2013  ? normal   ? History of irregular heartbeat   ? Per pt, heart skips beats at times/had Echo  ? Hyperlipidemia   ? Hypertension   ? Iritis, recurrent   ?  sees Dr. Silvestre Gunner at [facility]   ? Normal cardiac stress test 09/27/2002  ? Palpitations   ? PVCs and PACs per event monitor  ? Post-operative nausea and vomiting   ? Uveitis   ? sees Dr Polly Cobia at [facility] Dr Manuella Ghazi now  ?  ?Family History  ?Problem Relation Age of Onset  ? Heart attack Father 88  ? Kidney disease Father   ? Heart disease Father   ? Colon cancer Father   ? Thyroid cancer Mother   ? Heart disease Mother   ? Heart attack Paternal Grandmother   ? Thyroid cancer Brother   ? Diabetes Paternal Grandfather   ? Stomach cancer Neg Hx   ?  ?Past Surgical History:  ?Procedure Laterality Date  ? APPENDECTOMY    ? CATARACT EXTRACTION    ? Dr Katy Fitch , left eye/ 2 years ago  ? CATARACT EXTRACTION  03/16/2017  ? Right eye  ? COLONOSCOPY  04/20/2017  ? per Dr. Loletha Carrow, adenomatous polyps, repeat in 5 yrs   ? SHOULDER ARTHROSCOPY    ? both shoulders   ? TONSILLECTOMY    ? TONSILLECTOMY AND ADENOIDECTOMY    ? tubes in ears    ? 76 years ago  ? ?Social History  ? ?Occupational History  ? Occupation: Nurse, learning disability  ?Tobacco Use  ? Smoking status: Former  ?  Packs/day: 1.00  ?  Years: 8.00  ?  Pack years: 8.00  ?  Types: Cigarettes  ?  Quit date: 02/06/1970  ?  Years since quitting: 77  ? Smokeless tobacco: Never  ?Substance and Sexual Activity  ? Alcohol use: Yes  ?  Alcohol/week: 7.0 standard drinks  ?  Types: 7 Standard drinks or equivalent per week  ?  Comment: glass of wine most days  ? Drug use: No  ? Sexual activity: Not on file  ? ? ? ? ? ? ?

## 2021-11-16 NOTE — Progress Notes (Signed)
? ?Office Visit Note ?  ?Patient: [name] [name].           ?Date of Birth: Jul 05, 1946           ?MRN: [id number] ?Visit Date: 11/16/2021 ?             ?Requested by: Laurey Morale, MD ?[address] ?[place],  [place] [address] ?PCP: Laurey Morale, MD ? ? ?Assessment & Plan: ?Visit Diagnoses:  ?1. Ganglion of right wrist   ? ? ?Plan: Patient notes that the ganglion cyst at the ulnar aspect of the right wrist has gotten bigger since it was aspirated back in November.  An MRI was obtained to try to find the origin of the cyst.  It was unclear where the cyst originates based on the imaging but there is a suggestion that it comes from the Pisotriquetral joint.  Patient is interested in discussing surgical management of the cyst given the recent increase in size and failed aspiration.  We discussed the risks of cyst excision including bleeding, infection, damage to nearby blood vessels nerves including the dorsal sensory branch of the ulnar nerve, recurrence, need for additional procedures.  Patient understands risks and wants to proceed with surgery.  A surgical date will be confirmed with the patient. ? ?Follow-Up Instructions: No follow-ups on file.  ? ?Orders:  ?No orders of the defined types were placed in this encounter. ? ?No orders of the defined types were placed in this encounter. ? ? ? ? Procedures: ?No procedures performed ? ? ?Clinical Data: ?No additional findings. ? ? ?Subjective: ?Chief Complaint  ?Patient presents with  ? Right Wrist - Follow-up  ?  Cyst is getting larger  ? ? ?This is a 76 year old right-hand-dominant male presents for follow-up of a ganglion cyst to the ulnar aspect of the right wrist.  This was first noticed in August or September.  I saw him in November where the cyst was aspirated and 3 cc of mucinous fluid was obtained.  That has since recurred and actually gotten larger since the aspiration.  An MRI was obtained which demonstrated a ganglion cyst that likely  originates from the pisotriquetral joint.  It is largely asymptomatic in terms of pain and decreased function.  He is interested in discussing surgery today given the recurrence following aspiration ? ? ?Review of Systems ? ? ?Objective: ?Vital Signs: There were no vitals taken for this visit. ? ?Physical Exam ?Constitutional:   ?   Appearance: Normal appearance.  ?Cardiovascular:  ?   Rate and Rhythm: Normal rate.  ?   Pulses: Normal pulses.  ?Pulmonary:  ?   Effort: Pulmonary effort is normal.  ?Skin: ?   General: Skin is warm and dry.  ?   Capillary Refill: Capillary refill takes less than 2 seconds.  ?Neurological:  ?   Mental Status: He is alert.  ? ? ?Right Hand Exam  ? ?Tenderness  ?The patient is experiencing no tenderness.  ? ?Range of Motion  ?The patient has normal right wrist ROM.  ? ?Other  ?Erythema: absent ?Sensation: normal ?Pulse: present ? ?Comments:  Large cystic mass at ulnar aspect of wrist around the lateral apect of the ulnar head.  Mass is superficial, firm, mobile, approximately 3 cm proximal to distal and approximately 2 cm in width.  ? ? ? ? ?Specialty Comments:  ?No specialty comments available. ? ?Imaging: ?No results found. ? ? ?PMFS History: ?Patient Active Problem List  ? Diagnosis Date  Noted  ? Ganglion of right wrist 07/30/2021  ? OA (osteoarthritis) 06/17/2021  ? COVID-19 virus infection 02/17/2021  ? Neuropathy 12/09/2020  ? Uveitis of both eyes 05/21/2020  ? Type 2 diabetes mellitus without complication, without long-term current use of insulin ([place]) 07/26/2017  ? Palpitations 02/07/2012  ? OTHER TENOSYNOVITIS OF HAND AND WRIST 06/01/2008  ? Dyslipidemia 03/05/2007  ? GOUT 03/05/2007  ? Essential hypertension 03/05/2007  ? DIVERTICULOSIS, COLON 03/05/2007  ? COLONIC POLYPS, BENIGN, HX OF 03/05/2007  ? BPH with urinary obstruction 03/05/2007  ? ?Past Medical History:  ?Diagnosis Date  ? Cataract   ? Bil/ right eye surgery  ? Diabetes mellitus   ? type II  ? Diverticulitis of colon    ? ED (erectile dysfunction)   ? Gout   ? no meds  ? H/O echocardiogram 2013  ? normal   ? History of irregular heartbeat   ? Per pt, heart skips beats at times/had Echo  ? Hyperlipidemia   ? Hypertension   ? Iritis, recurrent   ?  sees [facility]. Silvestre Gunner at [facility]   ? Normal cardiac stress test 09/27/2002  ? Palpitations   ? PVCs and PACs per event monitor  ? Post-operative nausea and vomiting   ? Uveitis   ? sees Dr Polly Cobia at [facility] Dr Manuella Ghazi now  ?  ?Family History  ?Problem Relation Age of Onset  ? Heart attack Father 80  ? Kidney disease Father   ? Heart disease Father   ? Colon cancer Father   ? Thyroid cancer Mother   ? Heart disease Mother   ? Heart attack Paternal Grandmother   ? Thyroid cancer Brother   ? Diabetes Paternal Grandfather   ? Stomach cancer Neg Hx   ?  ?Past Surgical History:  ?Procedure Laterality Date  ? APPENDECTOMY    ? CATARACT EXTRACTION    ? Dr Katy Fitch , left eye/ 2 years ago  ? CATARACT EXTRACTION  03/16/2017  ? Right eye  ? COLONOSCOPY  04/20/2017  ? per Dr. Loletha Carrow, adenomatous polyps, repeat in 5 yrs   ? SHOULDER ARTHROSCOPY    ? both shoulders   ? TONSILLECTOMY    ? TONSILLECTOMY AND ADENOIDECTOMY    ? tubes in ears    ? 55 years ago  ? ?Social History  ? ?Occupational History  ? Occupation: Nurse, learning disability  ?Tobacco Use  ? Smoking status: Former  ?  Packs/day: 1.00  ?  Years: 8.00  ?  Pack years: 8.00  ?  Types: Cigarettes  ?  Quit date: 02/06/1970  ?  Years since quitting: 51.8  ? Smokeless tobacco: Never  ?Substance and Sexual Activity  ? Alcohol use: Yes  ?  Alcohol/week: 7.0 standard drinks  ?  Types: 7 Standard drinks or equivalent per week  ?  Comment: glass of wine most days  ? Drug use: No  ? Sexual activity: Not on file  ? ? ? ? ? ? ?

## 2021-11-17 ENCOUNTER — Encounter (HOSPITAL_BASED_OUTPATIENT_CLINIC_OR_DEPARTMENT_OTHER): Payer: Self-pay | Admitting: Orthopedic Surgery

## 2021-11-23 ENCOUNTER — Other Ambulatory Visit: Payer: Self-pay

## 2021-11-23 ENCOUNTER — Encounter (HOSPITAL_BASED_OUTPATIENT_CLINIC_OR_DEPARTMENT_OTHER)
Admission: RE | Admit: 2021-11-23 | Discharge: 2021-11-23 | Disposition: A | Discharge status: Home / Self Care | Payer: BC Managed Care – PPO | Source: Ambulatory Visit | Attending: Orthopedic Surgery | Admitting: Orthopedic Surgery

## 2021-11-23 ENCOUNTER — Encounter: Payer: Self-pay | Admitting: Family Medicine

## 2021-11-23 DIAGNOSIS — E119 Type 2 diabetes mellitus without complications: Secondary | ICD-10-CM | POA: Insufficient documentation

## 2021-11-23 DIAGNOSIS — Z01812 Encounter for preprocedural laboratory examination: Secondary | ICD-10-CM | POA: Diagnosis not present

## 2021-11-23 LAB — BASIC METABOLIC PANEL
Anion gap: 7 (ref 5–15)
BUN: 36 mg/dL — ABNORMAL HIGH (ref 8–23)
CO2: 24 mmol/L (ref 22–32)
Calcium: 9.3 mg/dL (ref 8.9–10.3)
Chloride: 105 mmol/L (ref 98–111)
Creatinine, Ser: 1.48 mg/dL — ABNORMAL HIGH (ref 0.61–1.24)
GFR, Estimated: 49 mL/min — ABNORMAL LOW (ref >60)
Glucose, Bld: 167 mg/dL — ABNORMAL HIGH (ref 70–99)
Potassium: 5.5 mmol/L — ABNORMAL HIGH (ref 3.5–5.1)
Sodium: 136 mmol/L (ref 135–145)

## 2021-11-23 NOTE — Telephone Encounter (Signed)
Yes I recommend her get the flu shot, since flu season lasts through April. And yes he should get the latest bivalent Covid vaccine (a single shot)  ?

## 2021-11-23 NOTE — Progress Notes (Signed)
Reviewed PAT lab work results with Dr. Valma Cava. Repeat DOS  ?

## 2021-11-24 ENCOUNTER — Ambulatory Visit: Payer: BC Managed Care – PPO

## 2021-11-24 ENCOUNTER — Ambulatory Visit: Payer: BC Managed Care – PPO | Attending: Internal Medicine

## 2021-11-24 ENCOUNTER — Other Ambulatory Visit (HOSPITAL_BASED_OUTPATIENT_CLINIC_OR_DEPARTMENT_OTHER): Payer: Self-pay

## 2021-11-24 DIAGNOSIS — Z23 Encounter for immunization: Secondary | ICD-10-CM

## 2021-11-24 DIAGNOSIS — M50323 Other cervical disc degeneration at C6-C7 level: Secondary | ICD-10-CM | POA: Diagnosis not present

## 2021-11-24 DIAGNOSIS — M50321 Other cervical disc degeneration at C4-C5 level: Secondary | ICD-10-CM | POA: Diagnosis not present

## 2021-11-24 DIAGNOSIS — M5137 Other intervertebral disc degeneration, lumbosacral region: Secondary | ICD-10-CM | POA: Diagnosis not present

## 2021-11-24 DIAGNOSIS — M9903 Segmental and somatic dysfunction of lumbar region: Secondary | ICD-10-CM | POA: Diagnosis not present

## 2021-11-24 MED ORDER — PFIZER COVID-19 VAC BIVALENT 30 MCG/0.3ML IM SUSP
INTRAMUSCULAR | 0 refills | Status: DC
  Filled 2021-11-24: qty 0.3, 1d supply, fill #0

## 2021-11-24 NOTE — Progress Notes (Signed)
? ?  Covid-19 Vaccination Clinic ? ?Name:  [name] [name].    ?MRN: [id number] ?DOB: 12/17/1945 ? ?11/24/2021 ? ?[name] was observed post Covid-19 immunization for 15 minutes without incident. He was provided with Vaccine Information Sheet and instruction to access the V-Safe system.  ? ?[name] was instructed to call 911 with any severe reactions post vaccine: ?Difficulty breathing  ?Swelling of face and throat  ?A fast heartbeat  ?A bad rash all over body  ?Dizziness and weakness  ? ?Immunizations Administered   ? ? Name Date Dose VIS Date Route  ? Ambulance person Booster 11/24/2021  3:33 PM 0.3 mL 05/12/2021 Intramuscular  ? Manufacturer: [place]: [phone]  ? [place]: [phone]  ? ?  ? ? ?

## 2021-11-26 DIAGNOSIS — H6120 Impacted cerumen, unspecified ear: Secondary | ICD-10-CM | POA: Diagnosis not present

## 2021-11-26 DIAGNOSIS — H6123 Impacted cerumen, bilateral: Secondary | ICD-10-CM | POA: Diagnosis not present

## 2021-11-26 DIAGNOSIS — H903 Sensorineural hearing loss, bilateral: Secondary | ICD-10-CM | POA: Diagnosis not present

## 2021-11-26 DIAGNOSIS — Z888 Allergy status to other drugs, medicaments and biological substances status: Secondary | ICD-10-CM | POA: Diagnosis not present

## 2021-11-29 ENCOUNTER — Other Ambulatory Visit: Payer: Self-pay

## 2021-11-29 ENCOUNTER — Encounter (HOSPITAL_BASED_OUTPATIENT_CLINIC_OR_DEPARTMENT_OTHER): Payer: Self-pay | Admitting: Orthopedic Surgery

## 2021-11-29 ENCOUNTER — Ambulatory Visit (HOSPITAL_BASED_OUTPATIENT_CLINIC_OR_DEPARTMENT_OTHER): Payer: BC Managed Care – PPO | Admitting: Anesthesiology

## 2021-11-29 ENCOUNTER — Ambulatory Visit (HOSPITAL_BASED_OUTPATIENT_CLINIC_OR_DEPARTMENT_OTHER)
Admission: RE | Admit: 2021-11-29 | Discharge: 2021-11-29 | Disposition: A | Discharge status: Home / Self Care | Payer: BC Managed Care – PPO | Attending: Orthopedic Surgery | Admitting: Orthopedic Surgery

## 2021-11-29 ENCOUNTER — Encounter (HOSPITAL_BASED_OUTPATIENT_CLINIC_OR_DEPARTMENT_OTHER)
Admission: RE | Disposition: A | Discharge status: Home / Self Care | Payer: Self-pay | Source: Home / Self Care | Attending: Orthopedic Surgery

## 2021-11-29 DIAGNOSIS — M67431 Ganglion, right wrist: Secondary | ICD-10-CM | POA: Diagnosis not present

## 2021-11-29 DIAGNOSIS — I10 Essential (primary) hypertension: Secondary | ICD-10-CM | POA: Diagnosis not present

## 2021-11-29 DIAGNOSIS — E119 Type 2 diabetes mellitus without complications: Secondary | ICD-10-CM

## 2021-11-29 DIAGNOSIS — Z87891 Personal history of nicotine dependence: Secondary | ICD-10-CM | POA: Insufficient documentation

## 2021-11-29 DIAGNOSIS — M1990 Unspecified osteoarthritis, unspecified site: Secondary | ICD-10-CM | POA: Diagnosis not present

## 2021-11-29 HISTORY — PX: GANGLION CYST EXCISION: SHX1691

## 2021-11-29 LAB — BASIC METABOLIC PANEL
Anion gap: 9 (ref 5–15)
BUN: 36 mg/dL — ABNORMAL HIGH (ref 8–23)
CO2: 22 mmol/L (ref 22–32)
Calcium: 9.3 mg/dL (ref 8.9–10.3)
Chloride: 105 mmol/L (ref 98–111)
Creatinine, Ser: 1.46 mg/dL — ABNORMAL HIGH (ref 0.61–1.24)
GFR, Estimated: 50 mL/min — ABNORMAL LOW (ref >60)
Glucose, Bld: 155 mg/dL — ABNORMAL HIGH (ref 70–99)
Potassium: 5.6 mmol/L — ABNORMAL HIGH (ref 3.5–5.1)
Sodium: 136 mmol/L (ref 135–145)

## 2021-11-29 LAB — GLUCOSE, CAPILLARY
Glucose-Capillary: 151 mg/dL — ABNORMAL HIGH (ref 70–99)
Glucose-Capillary: 155 mg/dL — ABNORMAL HIGH (ref 70–99)

## 2021-11-29 SURGERY — EXCISION, GANGLION CYST, WRIST
Anesthesia: General | Site: Wrist | Laterality: Right

## 2021-11-29 MED ORDER — FENTANYL CITRATE (PF) 100 MCG/2ML IJ SOLN
INTRAMUSCULAR | Status: DC | PRN
  Administered 2021-11-29 (×2): 50 ug via INTRAVENOUS
  Administered 2021-11-29 (×2): 25 ug via INTRAVENOUS

## 2021-11-29 MED ORDER — LACTATED RINGERS IV SOLN: INTRAVENOUS | Status: DC

## 2021-11-29 MED ORDER — PROPOFOL 500 MG/50ML IV EMUL
INTRAVENOUS | Status: DC | PRN
  Administered 2021-11-29: 25 ug/kg/min via INTRAVENOUS

## 2021-11-29 MED ORDER — ONDANSETRON HCL 4 MG/2ML IJ SOLN: 4.0000 mg | Freq: Once | INTRAMUSCULAR | Status: DC | PRN

## 2021-11-29 MED ORDER — DEXAMETHASONE SODIUM PHOSPHATE 10 MG/ML IJ SOLN
INTRAMUSCULAR | Status: AC
  Filled 2021-11-29: qty 1

## 2021-11-29 MED ORDER — FENTANYL CITRATE (PF) 100 MCG/2ML IJ SOLN
INTRAMUSCULAR | Status: AC
  Filled 2021-11-29: qty 2

## 2021-11-29 MED ORDER — EPHEDRINE SULFATE (PRESSORS) 50 MG/ML IJ SOLN
INTRAMUSCULAR | Status: DC | PRN
  Administered 2021-11-29 (×2): 10 mg via INTRAVENOUS

## 2021-11-29 MED ORDER — ACETAMINOPHEN 500 MG PO TABS
ORAL_TABLET | ORAL | Status: AC
  Filled 2021-11-29: qty 2

## 2021-11-29 MED ORDER — ONDANSETRON HCL 4 MG/2ML IJ SOLN
INTRAMUSCULAR | Status: DC | PRN
  Administered 2021-11-29: 4 mg via INTRAVENOUS

## 2021-11-29 MED ORDER — AMISULPRIDE (ANTIEMETIC) 5 MG/2ML IV SOLN: 10.0000 mg | Freq: Once | INTRAVENOUS | Status: DC | PRN

## 2021-11-29 MED ORDER — ACETAMINOPHEN 500 MG PO TABS
1000.0000 mg | ORAL_TABLET | Freq: Four times a day (QID) | ORAL | Status: DC | PRN
  Administered 2021-11-29: 1000 mg via ORAL

## 2021-11-29 MED ORDER — PROPOFOL 10 MG/ML IV BOLUS
INTRAVENOUS | Status: DC | PRN
Start: 2021-11-29 — End: 2021-11-29
  Administered 2021-11-29: 150 mg via INTRAVENOUS

## 2021-11-29 MED ORDER — 0.9 % SODIUM CHLORIDE (POUR BTL) OPTIME
TOPICAL | Status: DC | PRN
  Administered 2021-11-29: 120 mL

## 2021-11-29 MED ORDER — LIDOCAINE HCL (CARDIAC) PF 100 MG/5ML IV SOSY
PREFILLED_SYRINGE | INTRAVENOUS | Status: DC | PRN
  Administered 2021-11-29: 60 mg via INTRAVENOUS

## 2021-11-29 MED ORDER — BUPIVACAINE HCL (PF) 0.25 % IJ SOLN
INTRAMUSCULAR | Status: DC | PRN
Start: 2021-11-29 — End: 2021-11-29
  Administered 2021-11-29: 6 mL

## 2021-11-29 MED ORDER — ONDANSETRON HCL 4 MG/2ML IJ SOLN
INTRAMUSCULAR | Status: AC
  Filled 2021-11-29: qty 2

## 2021-11-29 MED ORDER — CEFAZOLIN SODIUM-DEXTROSE 2-4 GM/100ML-% IV SOLN
2.0000 g | INTRAVENOUS | Status: AC
  Administered 2021-11-29: 2 g via INTRAVENOUS

## 2021-11-29 MED ORDER — FENTANYL CITRATE (PF) 100 MCG/2ML IJ SOLN
INTRAMUSCULAR | Status: AC
Start: 2021-11-29 — End: ?
  Filled 2021-11-29: qty 2

## 2021-11-29 MED ORDER — FENTANYL CITRATE (PF) 100 MCG/2ML IJ SOLN: 25.0000 ug | INTRAMUSCULAR | Status: DC | PRN

## 2021-11-29 MED ORDER — OXYCODONE HCL 5 MG PO TABS: 5.0000 mg | ORAL_TABLET | ORAL | 0 refills | Status: AC | PRN

## 2021-11-29 SURGICAL SUPPLY — 43 items
APL PRP STRL LF DISP 70% ISPRP (MISCELLANEOUS) ×1
BLADE SURG 15 STRL LF DISP TIS (BLADE) ×1 IMPLANT
BLADE SURG 15 STRL SS (BLADE) ×2
BNDG CMPR 9X4 STRL LF SNTH (GAUZE/BANDAGES/DRESSINGS) ×1
BNDG ELASTIC 3X5.8 VLCR STR LF (GAUZE/BANDAGES/DRESSINGS) ×2 IMPLANT
BNDG ESMARK 4X9 LF (GAUZE/BANDAGES/DRESSINGS) ×2 IMPLANT
BNDG GAUZE ELAST 4 BULKY (GAUZE/BANDAGES/DRESSINGS) ×2 IMPLANT
BNDG PLASTER X FAST 3X3 WHT LF (CAST SUPPLIES) IMPLANT
BNDG PLSTR 9X3 FST ST WHT (CAST SUPPLIES)
CHLORAPREP W/TINT 26 (MISCELLANEOUS) ×2 IMPLANT
CORD BIPOLAR FORCEPS 12FT (ELECTRODE) ×2 IMPLANT
COVER BACK TABLE 60X90IN (DRAPES) ×2 IMPLANT
COVER MAYO STAND STRL (DRAPES) ×2 IMPLANT
CUFF TOURN SGL QUICK 18X4 (TOURNIQUET CUFF) ×1 IMPLANT
CUFF TOURN SGL QUICK 24 (TOURNIQUET CUFF)
CUFF TRNQT CYL 24X4X16.5-23 (TOURNIQUET CUFF) IMPLANT
DRAPE EXTREMITY T 121X128X90 (DISPOSABLE) ×2 IMPLANT
DRAPE SURG 17X23 STRL (DRAPES) ×2 IMPLANT
GAUZE SPONGE 4X4 12PLY STRL (GAUZE/BANDAGES/DRESSINGS) ×2 IMPLANT
GAUZE XEROFORM 1X8 LF (GAUZE/BANDAGES/DRESSINGS) ×2 IMPLANT
GLOVE SURG ENC MOIS LTX SZ7 (GLOVE) ×4 IMPLANT
GLOVE SURG UNDER POLY LF SZ7 (GLOVE) ×3 IMPLANT
GOWN STRL REUS W/ TWL LRG LVL3 (GOWN DISPOSABLE) ×1 IMPLANT
GOWN STRL REUS W/TWL LRG LVL3 (GOWN DISPOSABLE) ×4
GOWN STRL REUS W/TWL XL LVL3 (GOWN DISPOSABLE) ×2 IMPLANT
NDL HYPO 25X1 1.5 SAFETY (NEEDLE) IMPLANT
NEEDLE HYPO 25X1 1.5 SAFETY (NEEDLE) IMPLANT
NS IRRIG 1000ML POUR BTL (IV SOLUTION) ×2 IMPLANT
PACK BASIN DAY SURGERY FS (CUSTOM PROCEDURE TRAY) ×2 IMPLANT
PAD CAST 3X4 CTTN HI CHSV (CAST SUPPLIES) IMPLANT
PADDING CAST COTTON 3X4 STRL (CAST SUPPLIES)
SLEEVE SCD COMPRESS KNEE MED (STOCKING) IMPLANT
SPLINT FIBERGLASS 4X30 (CAST SUPPLIES) ×1 IMPLANT
SUCTION FRAZIER HANDLE 10FR (MISCELLANEOUS)
SUCTION TUBE FRAZIER 10FR DISP (MISCELLANEOUS) IMPLANT
SUT ETHILON 4 0 PS 2 18 (SUTURE) ×2 IMPLANT
SUT MNCRL AB 3-0 PS2 18 (SUTURE) ×2 IMPLANT
SUT VICRYL 4-0 PS2 18IN ABS (SUTURE) IMPLANT
SYR BULB EAR ULCER 3OZ GRN STR (SYRINGE) ×2 IMPLANT
SYR CONTROL 10ML LL (SYRINGE) ×1 IMPLANT
TOWEL GREEN STERILE FF (TOWEL DISPOSABLE) ×4 IMPLANT
TUBE CONNECTING 20X1/4 (TUBING) IMPLANT
UNDERPAD 30X36 HEAVY ABSORB (UNDERPADS AND DIAPERS) ×2 IMPLANT

## 2021-11-29 NOTE — Op Note (Signed)
In error. See other completed op report.  ?

## 2021-11-29 NOTE — Anesthesia Preprocedure Evaluation (Addendum)
Anesthesia Evaluation  ?Patient identified by MRN, date of birth, ID band ?Patient awake ? ? ? ?Reviewed: ?Allergy & Precautions, NPO status , Patient's Chart, lab work & pertinent test results ? ?Airway ?Mallampati: II ? ?TM Distance: >3 FB ?Neck ROM: Full ? ? ? Dental ?no notable dental hx. ? ?  ?Pulmonary ?neg pulmonary ROS, former smoker,  ?  ?Pulmonary exam normal ?breath sounds clear to auscultation ? ? ? ? ? ? Cardiovascular ?hypertension, Pt. on medications ?Normal cardiovascular exam+ dysrhythmias (palpitations)  ?Rhythm:Regular Rate:Normal ? ?EKG 04/2021: NSR, 1st AV block ? ?ECHO 02/24/2020: ? ? ??1. Left ventricular ejection fraction, by estimation, is 60 to 65%. The  ?left ventricle has normal function. The left ventricle has no regional  ?wall motion abnormalities. There is moderate asymmetric left ventricular  ?hypertrophy of the basal-septal  ?segment. Left ventricular diastolic parameters are consistent with Grade I  ?diastolic dysfunction (impaired relaxation).  ??2. Right ventricular systolic function is normal. The right ventricular  ?size is normal. Tricuspid regurgitation signal is inadequate for assessing  ?PA pressure.  ??3. The mitral valve is degenerative. Trivial mitral valve regurgitation.  ?No evidence of mitral stenosis.  ??4. The aortic valve is tricuspid. Aortic valve regurgitation is mild. No  ?aortic stenosis is present.  ??5. The inferior vena cava is normal in size with greater than 50%  ?respiratory variability, suggesting right atrial pressure of 3 mmHg.  ?  ?Neuro/Psych ?negative neurological ROS ? negative psych ROS  ? GI/Hepatic ?negative GI ROS, Neg liver ROS,   ?Endo/Other  ?negative endocrine ROSdiabetes ? Renal/GU ?negative Renal ROS  ?negative genitourinary ?  ?Musculoskeletal ? ?(+) Arthritis ,  ? Abdominal ?  ?Peds ?negative pediatric ROS ?(+)  Hematology ?negative hematology ROS ?(+)   ?Anesthesia Other Findings ? ?  Reproductive/Obstetrics ?negative OB ROS ? ?  ? ? ? ? ? ? ? ? ? ? ? ? ? ?  ?  ? ? ? ? ? ? ? ? ?Anesthesia Physical ?Anesthesia Plan ? ?ASA: 3 ? ?Anesthesia Plan: General  ? ?Post-op Pain Management: Toradol IV (intra-op)* and Tylenol PO (pre-op)*  ? ?Induction: Intravenous ? ?PONV Risk Score and Plan: 2 and Treatment may vary due to age or medical condition, Ondansetron and Dexamethasone ? ?Airway Management Planned: LMA ? ?Additional Equipment: None ? ?Intra-op Plan:  ? ?Post-operative Plan: Extubation in OR ? ?Informed Consent: I have reviewed the patients History and Physical, chart, labs and discussed the procedure including the risks, benefits and alternatives for the proposed anesthesia with the patient or authorized representative who has indicated his/her understanding and acceptance.  ? ? ? ?Dental advisory given ? ?Plan Discussed with: Anesthesiologist, Surgeon and CRNA ? ?Anesthesia Plan Comments: (Patient states he tolerates Acetaminophen fine with no known reaction. Norton Blizzard, MD  ?)  ? ? ? ? ? ?Anesthesia Quick Evaluation ? ?

## 2021-11-29 NOTE — Transfer of Care (Signed)
Immediate Anesthesia Transfer of Care Note ? ?Patient: [name] [name]. ? ?Procedure(s) Performed: RIGHT REMOVAL GANGLION OF WRIST (Right: Wrist) ? ?Patient Location: PACU ? ?Anesthesia Type:General ? ?Level of Consciousness: drowsy and patient cooperative ? ?Airway & Oxygen Therapy: Patient Spontanous Breathing and Patient connected to face mask oxygen ? ?Post-op Assessment: Report given to RN and Post -op Vital signs reviewed and stable ? ?Post vital signs: Reviewed and stable ? ?Last Vitals:  ?Vitals Value Taken Time  ?BP    ?Temp    ?Pulse 90 11/29/21 1601  ?Resp    ?SpO2 100 % 11/29/21 1601  ?Vitals shown include unvalidated device data. ? ?Last Pain:  ?Vitals:  ? 11/29/21 1345  ?TempSrc: Oral  ?   ? ?  ? ?Complications: No notable events documented. ?

## 2021-11-29 NOTE — Discharge Instructions (Addendum)
? ?Audria Nine, M.D. ?Hand Surgery ? ?POST-OPERATIVE DISCHARGE INSTRUCTIONS ? ? ?PRESCRIPTIONS: ?- You have been given a prescription to be taken as directed for post-operative pain control.  You may also take over the counter ibuprofen/aleve and tylenol for pain. Take this as directed on the packaging. Do not exceed 3000 mg tylenol/acetaminophen in 24 hours. ? ?Ibuprofen 600-800 mg (3-4) tablets by mouth every 6 hours as needed for pain.  ? ?OR ? ?Aleve 2 tablets by mouth every 12 hours (twice daily) as needed for pain. ?  ?AND/OR ? ?Tylenol 1000 mg (2 tablets) every 8 hours as needed for pain. ? ?- Please use your pain medication carefully, as refills are limited and you may not be provided with one.  As stated above, please use over the counter pain medicine - it will also be helpful with decreasing your swelling.  ? ? ?ANESTHESIA: ?-After your surgery, post-surgical discomfort or pain is likely. This discomfort can last several days to a few weeks. At certain times of the day your discomfort may be more intense.  ? ?Did you receive a nerve block?  ? ?- A nerve block can provide pain relief for one hour to two days after your surgery. As long as the nerve block is working, you will experience little or no sensation in the area the surgeon operated on.  ?- As the nerve block wears off, you will begin to experience pain or discomfort. It is very important that you begin taking your prescribed pain medication before the nerve block fully wears off. Treating your pain at the first sign of the block wearing off will ensure your pain is better controlled and more tolerable when full-sensation returns. Do not wait until the pain is intolerable, as the medicine will be less effective. It is better to treat pain in advance than to try and catch up.  ? ?General Anesthesia:  ?If you did not receive a nerve block during your surgery, you will need to start taking your pain medication shortly after your surgery and  should continue to do so as prescribed by your surgeon.   ? ? ?ICE AND ELEVATION: ?- You may use ice for the first 48-72 hours, but it is not critical.   ?- Motion of your fingers is very important to decrease the swelling.  ?- Elevation, as much as possible for the next 48 hours, is critical for decreasing swelling as well as for pain relief. Elevation means when you are seated or lying down, you hand should be at or above your heart. When walking, the hand needs to be at or above the level of your elbow.  ?- If the bandage gets too tight, it may need to be loosened. Please contact our office and we will instruct you in how to do this.  ? ? ?SURGICAL BANDAGES:  ?- Keep your dressing and/or splint clean and dry at all times.  You can remove your splint 7 days from now and change with a dry dressing or Band-Aids as needed thereafter. ?- You may place a plastic bag over your bandage to shower, but be careful, do not get your bandages wet.  ?- After the bandages have been removed, it is OK to get the stitches wet in a shower or with hand washing. Do Not soak or submerge the wound yet. Please do not use lotions or creams on the stitches.   ?  ? ?HAND THERAPY:  ?- You may not need any. If you do,  we will begin this at your follow up visit in the clinic.  ? ? ?ACTIVITY AND WORK: ?- You are encouraged to move any fingers which are not in the bandage.  ?- Light use of the fingers is allowed to assist the other hand with daily hygiene and eating, but strong gripping or lifting is often uncomfortable and should be avoided.  ?- You might miss a variable period of time from work and hopefully this issue has been discussed prior to surgery. You may not do any heavy work with your affected hand for about 2 weeks.  ? ? ?[facility] ?[address] ?[place],    [address] ?[phone]  ?Post Anesthesia Home Care Instructions ? ?Activity: ?Get plenty of rest for the remainder of the day. A responsible  individual must stay with you for 24 hours following the procedure.  ?For the next 24 hours, DO NOT: ?-Drive a car ?-Paediatric nurse ?-Drink alcoholic beverages ?-Take any medication unless instructed by your physician ?-Make any legal decisions or sign important papers. ? ?Meals: ?Start with liquid foods such as gelatin or soup. Progress to regular foods as tolerated. Avoid greasy, spicy, heavy foods. If nausea and/or vomiting occur, drink only clear liquids until the nausea and/or vomiting subsides. Call your physician if vomiting continues. ? ?Special Instructions/Symptoms: ?Your throat may feel dry or sore from the anesthesia or the breathing tube placed in your throat during surgery. If this causes discomfort, gargle with warm salt water. The discomfort should disappear within 24 hours. ? ?If you had a scopolamine patch placed behind your ear for the management of post- operative nausea and/or vomiting: ? ?1. The medication in the patch is effective for 72 hours, after which it should be removed.  Wrap patch in a tissue and discard in the trash. Wash hands thoroughly with soap and water. ?2. You may remove the patch earlier than 72 hours if you experience unpleasant side effects which may include dry mouth, dizziness or visual disturbances. ?3. Avoid touching the patch. Wash your hands with soap and water after contact with the patch. ?    ? ? ? ? ?

## 2021-11-29 NOTE — Op Note (Signed)
? ?Date of Surgery: 11/29/2021 ? ?INDICATIONS: [name] [name] is a 76 y.o.-year-old male with right ulnar-sided ganglion cyst that has failed conservative management with aspiration.  An MRI was obtained which demonstrated a loculated mass at the ulnar wrist, consistent with a ganglion cyst, that appeared to be coming from the pisotriquetral joint.  Risks, benefits, and alternatives to surgery were again discussed with the patient wishing to proceed with surgery.  Informed consent was signed after our discussion.  ? ?PREOPERATIVE DIAGNOSIS:  ?Right ulnar sided wrist ganglion cyst ? ?POSTOPERATIVE DIAGNOSIS: Same. ? ?PROCEDURE:  ?Right wrist ganglion cyst excision ? ? ?SURGEON: Audria Nine, M.D. ? ?ASSIST:  ? ?ANESTHESIA:  general ? ?IV FLUIDS AND URINE: See anesthesia. ? ?ESTIMATED BLOOD LOSS: 5 mL. ? ?IMPLANTS: * No implants in log *  ? ?DRAINS: None ? ?COMPLICATIONS: None ? ?DESCRIPTION OF PROCEDURE: The patient was met in the preoperative holding area where the surgical site was marked and the consent form was verified.  The patient was then taken to the operating room and transferred to the operating table.  All bony prominences were well padded.  A tourniquet was applied to the right upper arm.  General endotracheal anesthesia  was induced.  The operative extremity was prepped and draped in the usual and sterile fashion.  A formal time-out was performed to confirm that this was the correct patient, surgery, side, and site.  ? ?Following formal timeout, the limb was gently exsanguinated with an Esmarch bandage and the tourniquet plated to 50 mmHg.  A longitudinal incision was made directly over the cyst.  The skin was incised.  Blunt dissection was used to identify the large multilobed cyst.  Longitudinal dissection distally identified the dorsal sensory branch of the ulnar nerve which crossed over the distal most aspect of the cyst.  This was protected and retracted throughout the surgery.  Blunt dissection  was also taken proximally to identify the proximal most aspect of the cyst.  The distal lobe of the cyst ruptured during this blunt dissection with expression of clear mucinous material.  The cyst was dissected off of the ulnar carpal wrist capsule.  It was followed in a volar direction.  The cyst was excised in several large pieces with the distal and largest lobe being dissected free first.  There was a defect in the capsule at the level of the pisotriquetral joint where the cyst seems to have originated.  The ulnar aspect of the ulnocarpal capsule was intact.  The smaller lobes of the cyst both dorsally and proximally were excised in a similar fashion.  All of this cyst wall tissue was sent for pathology and passed off the back table.  With the cyst wall tissue completely excised and the origin of the cyst seemingly identified, the wound was thoroughly irrigated with copious sterile saline.  The dorsal cutaneous branch of the ulnar nerve was again identified and found to be in continuity.  The wound was then closed in a layered fashion with 4-0 buried interrupted Monocryl followed by 4-0 nylon sutures in a horizontal mattress fashion.  The tourniquet was let down and hemostasis was achieved with direct pressure.  The wound was then dressed with Xeroform, folded Kerlix, cast padding, and a volar splint. ? ?The patient was then reversed from anesthesia, extubated, and transferred the postoperative bed.  All counts were correct times within the procedure.  The patient was then taken the PACU in stable condition. ? ?POSTOPERATIVE PLAN: He will be discharged home with  appropriate pain medication and discharge instructions.  I will see him back in 10 to 14 days for his first postoperative visit. ? ?Audria Nine, MD ?6:47 PM  ?

## 2021-11-29 NOTE — Anesthesia Procedure Notes (Signed)
Procedure Name: LMA Insertion ?Date/Time: 11/29/2021 3:03 PM ?Performed by: Trennon Torbeck, Ernesta Amble, CRNA ?Pre-anesthesia Checklist: Patient identified, Emergency Drugs available, Suction available and Patient being monitored ?Patient Re-evaluated:Patient Re-evaluated prior to induction ?Oxygen Delivery Method: Circle System Utilized ?Preoxygenation: Pre-oxygenation with 100% oxygen ?Induction Type: IV induction ?Ventilation: Mask ventilation without difficulty ?LMA: LMA inserted ?LMA Size: 4.0 ?Number of attempts: 1 ?Airway Equipment and Method: bite block ?Placement Confirmation: positive ETCO2 ?Tube secured with: Tape ?Dental Injury: Teeth and Oropharynx as per pre-operative assessment  ? ? ? ? ?

## 2021-11-29 NOTE — Brief Op Note (Signed)
11/29/2021 ? ?3:57 PM ? ?PATIENT:  [name] [name].  76 y.o. male ? ?PRE-OPERATIVE DIAGNOSIS:  RIGHT WRIST GANGLION CYST ? ?POST-OPERATIVE DIAGNOSIS:  RIGHT WRIST GANGLION CYST ? ?PROCEDURE:  Procedure(s): ?RIGHT REMOVAL GANGLION OF WRIST (Right) ? ?SURGEON:  Surgeon(s) and Role: ?   * Sherilyn Cooter, MD - Primary ? ?PHYSICIAN ASSISTANT:  ? ?ASSISTANTS: none  ? ?ANESTHESIA:   general ? ?EBL:  <5 cc  ? ?BLOOD ADMINISTERED:none ? ?DRAINS: none  ? ?LOCAL MEDICATIONS USED:  MARCAINE    ? ?SPECIMEN:  Right wrist mass ? ?DISPOSITION OF SPECIMEN:  PATHOLOGY ? ?COUNTS:  YES ? ?TOURNIQUET:   ?Total Tourniquet Time Documented: ?Upper Arm (Right) - 34 minutes ?Total: Upper Arm (Right) - 34 minutes ? ? ?DICTATION: .Dragon Dictation ? ?PLAN OF CARE: Discharge to home after PACU ? ?PATIENT DISPOSITION:  PACU - hemodynamically stable. ?  ?Delay start of Pharmacological VTE agent (>24hrs) due to surgical blood loss or risk of bleeding: not applicable ? ?

## 2021-11-29 NOTE — Anesthesia Postprocedure Evaluation (Signed)
Anesthesia Post Note ? ?Patient: [name] [name]. ? ?Procedure(s) Performed: RIGHT REMOVAL GANGLION OF WRIST (Right: Wrist) ? ?  ? ?Patient location during evaluation: PACU ?Anesthesia Type: General ?Level of consciousness: awake ?Pain management: pain level controlled ?Vital Signs Assessment: post-procedure vital signs reviewed and stable ?Respiratory status: spontaneous breathing and respiratory function stable ?Cardiovascular status: stable ?Postop Assessment: no apparent nausea or vomiting ?Anesthetic complications: no ? ? ?No notable events documented. ? ?Last Vitals:  ?Vitals:  ? 11/29/21 1615 11/29/21 1630  ?BP: (!) 152/100 (!) 145/77  ?Pulse: 89 78  ?Resp: 15 13  ?Temp:    ?SpO2: 100% 96%  ?  ?Last Pain:  ?Vitals:  ? 11/29/21 1345  ?TempSrc: Oral  ? ? ?  ?  ?  ?  ?  ?  ? ?Merlinda Frederick ? ? ? ? ?

## 2021-12-01 ENCOUNTER — Encounter (HOSPITAL_BASED_OUTPATIENT_CLINIC_OR_DEPARTMENT_OTHER): Payer: Self-pay | Admitting: Orthopedic Surgery

## 2021-12-01 LAB — SURGICAL PATHOLOGY

## 2021-12-07 NOTE — Interval H&P Note (Signed)
History and Physical Interval Note: ? ?12/07/2021 ?5:39 PM ? ?[name] [name].  has presented today for surgery, with the diagnosis of RIGHT WRIST GANGLION CYST.  The various methods of treatment have been discussed with the patient and family. After consideration of risks, benefits and other options for treatment, the patient has consented to  Procedure(s): ?RIGHT REMOVAL GANGLION OF WRIST (Right) as a surgical intervention.  The patient's history has been reviewed, patient examined, no change in status, stable for surgery.  I have reviewed the patient's chart and labs.  Questions were answered to the patient's satisfaction.   ? ? ? [name] ? ? ?

## 2021-12-09 ENCOUNTER — Encounter: Payer: Self-pay | Admitting: Orthopedic Surgery

## 2021-12-09 ENCOUNTER — Ambulatory Visit (INDEPENDENT_AMBULATORY_CARE_PROVIDER_SITE_OTHER): Payer: BC Managed Care – PPO | Admitting: Orthopedic Surgery

## 2021-12-09 DIAGNOSIS — M67431 Ganglion, right wrist: Secondary | ICD-10-CM

## 2021-12-09 NOTE — Progress Notes (Signed)
? ?Post-Op Visit Note ?  ?Patient: [name] [name].           ?Date of Birth: 05-Feb-1946           ?MRN: [id number] ?Visit Date: 12/09/2021 ?PCP: Laurey Morale, MD ? ? ?Assessment & Plan: ? ?Chief Complaint:  ?Chief Complaint  ?Patient presents with  ? Right Wrist - Routine Post Op  ? ?Visit Diagnoses:  ?1. Ganglion of right wrist   ? ? ?Plan: Patient is 10 days s/p excision of a large ulnar sided wrist ganglion.  The cyst seems to have originated from the pisotriquetral joint.  He is still moderately swollen.  The incision is clean, dry, and well approximated.  The sutures were removed.  I can see him back if the mass returns or he has any issues.  ? ?Follow-Up Instructions: No follow-ups on file.  ? ?Orders:  ?No orders of the defined types were placed in this encounter. ? ?No orders of the defined types were placed in this encounter. ? ? ?Imaging: ?No results found. ? ?PMFS History: ?Patient Active Problem List  ? Diagnosis Date Noted  ? Ganglion of right wrist 07/30/2021  ? OA (osteoarthritis) 06/17/2021  ? COVID-19 virus infection 02/17/2021  ? Neuropathy 12/09/2020  ? Uveitis of both eyes 05/21/2020  ? Type 2 diabetes mellitus without complication, without long-term current use of insulin ([place]) 07/26/2017  ? Palpitations 02/07/2012  ? OTHER TENOSYNOVITIS OF HAND AND WRIST 06/01/2008  ? Dyslipidemia 03/05/2007  ? GOUT 03/05/2007  ? Essential hypertension 03/05/2007  ? DIVERTICULOSIS, COLON 03/05/2007  ? COLONIC POLYPS, BENIGN, HX OF 03/05/2007  ? BPH with urinary obstruction 03/05/2007  ? ?Past Medical History:  ?Diagnosis Date  ? Cataract   ? Bil/ right eye surgery  ? Diabetes mellitus   ? type II  ? Diverticulitis of colon   ? ED (erectile dysfunction)   ? Gout   ? no meds  ? H/O echocardiogram 2013  ? normal   ? History of irregular heartbeat   ? Per pt, heart skips beats at times/had Echo  ? Hyperlipidemia   ? Hypertension   ? Iritis, recurrent   ?  sees Dr. Silvestre Gunner at [facility]   ? Normal cardiac stress  test 09/27/2002  ? Palpitations   ? PVCs and PACs per event monitor  ? Uveitis   ? sees Dr Polly Cobia at [facility] Dr Manuella Ghazi now  ?  ?Family History  ?Problem Relation Age of Onset  ? Heart attack Father 38  ? Kidney disease Father   ? Heart disease Father   ? Colon cancer Father   ? Thyroid cancer Mother   ? Heart disease Mother   ? Heart attack Paternal Grandmother   ? Thyroid cancer Brother   ? Diabetes Paternal Grandfather   ? Stomach cancer Neg Hx   ?  ?Past Surgical History:  ?Procedure Laterality Date  ? APPENDECTOMY    ? CATARACT EXTRACTION    ? Dr Katy Fitch , left eye/ 2 years ago  ? CATARACT EXTRACTION  03/16/2017  ? Right eye  ? COLONOSCOPY  04/20/2017  ? per Dr. Loletha Carrow, adenomatous polyps, repeat in 5 yrs   ? GANGLION CYST EXCISION Right 11/29/2021  ? Procedure: RIGHT REMOVAL GANGLION OF WRIST;  Surgeon: Sherilyn Cooter, MD;  Location: [facility];  Service: Orthopedics;  Laterality: Right;  ? SHOULDER ARTHROSCOPY    ? both shoulders   ? TONSILLECTOMY    ? TONSILLECTOMY AND  ADENOIDECTOMY    ? tubes in ears    ? 55 years ago  ? ?Social History  ? ?Occupational History  ? Occupation: Nurse, learning disability  ?Tobacco Use  ? Smoking status: Former  ?  Packs/day: 1.00  ?  Years: 8.00  ?  Pack years: 8.00  ?  Types: Cigarettes  ?  Quit date: 02/06/1970  ?  Years since quitting: 51.8  ? Smokeless tobacco: Never  ?Substance and Sexual Activity  ? Alcohol use: Yes  ?  Alcohol/week: 7.0 standard drinks  ?  Types: 7 Standard drinks or equivalent per week  ?  Comment: glass of wine most days  ? Drug use: No  ? Sexual activity: Not on file  ? ? ? ?

## 2021-12-21 DIAGNOSIS — N289 Disorder of kidney and ureter, unspecified: Secondary | ICD-10-CM | POA: Diagnosis not present

## 2021-12-21 DIAGNOSIS — E875 Hyperkalemia: Secondary | ICD-10-CM | POA: Diagnosis not present

## 2021-12-21 DIAGNOSIS — Z7984 Long term (current) use of oral hypoglycemic drugs: Secondary | ICD-10-CM | POA: Diagnosis not present

## 2021-12-21 DIAGNOSIS — E119 Type 2 diabetes mellitus without complications: Secondary | ICD-10-CM | POA: Diagnosis not present

## 2021-12-21 DIAGNOSIS — R1031 Right lower quadrant pain: Secondary | ICD-10-CM | POA: Diagnosis not present

## 2021-12-21 DIAGNOSIS — R7989 Other specified abnormal findings of blood chemistry: Secondary | ICD-10-CM | POA: Diagnosis not present

## 2021-12-21 DIAGNOSIS — M109 Gout, unspecified: Secondary | ICD-10-CM | POA: Diagnosis not present

## 2021-12-21 DIAGNOSIS — I708 Atherosclerosis of other arteries: Secondary | ICD-10-CM | POA: Diagnosis not present

## 2021-12-21 DIAGNOSIS — H44113 Panuveitis, bilateral: Secondary | ICD-10-CM | POA: Diagnosis not present

## 2021-12-21 DIAGNOSIS — N281 Cyst of kidney, acquired: Secondary | ICD-10-CM | POA: Diagnosis not present

## 2021-12-21 DIAGNOSIS — Z8739 Personal history of other diseases of the musculoskeletal system and connective tissue: Secondary | ICD-10-CM | POA: Insufficient documentation

## 2021-12-21 DIAGNOSIS — K5730 Diverticulosis of large intestine without perforation or abscess without bleeding: Secondary | ICD-10-CM | POA: Diagnosis not present

## 2021-12-21 DIAGNOSIS — N400 Enlarged prostate without lower urinary tract symptoms: Secondary | ICD-10-CM | POA: Diagnosis not present

## 2021-12-21 DIAGNOSIS — I10 Essential (primary) hypertension: Secondary | ICD-10-CM | POA: Diagnosis not present

## 2021-12-21 DIAGNOSIS — I129 Hypertensive chronic kidney disease with stage 1 through stage 4 chronic kidney disease, or unspecified chronic kidney disease: Secondary | ICD-10-CM | POA: Diagnosis not present

## 2021-12-21 DIAGNOSIS — E1122 Type 2 diabetes mellitus with diabetic chronic kidney disease: Secondary | ICD-10-CM | POA: Diagnosis not present

## 2021-12-21 DIAGNOSIS — N1831 Chronic kidney disease, stage 3a: Secondary | ICD-10-CM | POA: Diagnosis not present

## 2021-12-22 DIAGNOSIS — M9903 Segmental and somatic dysfunction of lumbar region: Secondary | ICD-10-CM | POA: Diagnosis not present

## 2021-12-22 DIAGNOSIS — M50321 Other cervical disc degeneration at C4-C5 level: Secondary | ICD-10-CM | POA: Diagnosis not present

## 2021-12-22 DIAGNOSIS — M5137 Other intervertebral disc degeneration, lumbosacral region: Secondary | ICD-10-CM | POA: Diagnosis not present

## 2021-12-22 DIAGNOSIS — M50323 Other cervical disc degeneration at C6-C7 level: Secondary | ICD-10-CM | POA: Diagnosis not present

## 2021-12-24 ENCOUNTER — Encounter: Payer: Self-pay | Admitting: Family Medicine

## 2021-12-24 ENCOUNTER — Ambulatory Visit (INDEPENDENT_AMBULATORY_CARE_PROVIDER_SITE_OTHER): Payer: BC Managed Care – PPO | Admitting: Family Medicine

## 2021-12-24 VITALS — BP 106/70 | HR 68 | Temp 98.6°F | Wt 186.0 lb

## 2021-12-24 DIAGNOSIS — N183 Chronic kidney disease, stage 3 unspecified: Secondary | ICD-10-CM | POA: Insufficient documentation

## 2021-12-24 DIAGNOSIS — E119 Type 2 diabetes mellitus without complications: Secondary | ICD-10-CM

## 2021-12-24 DIAGNOSIS — N1831 Chronic kidney disease, stage 3a: Secondary | ICD-10-CM

## 2021-12-24 DIAGNOSIS — I10 Essential (primary) hypertension: Secondary | ICD-10-CM | POA: Diagnosis not present

## 2021-12-24 NOTE — Progress Notes (Signed)
? ?  Subjective:  ? ? Patient ID: [name] [name]., male    DOB: 1946/02/08, 76 y.o.   MRN: [id number] ? ?HPI ?Here with his wife to discuss his recent diagnosis of chronic kidney disease. His renal function had always been normal here (our last creatinine on 05-26-21 was 1.25). His eye doctor got some labs run and his creatinine had jumped up, so he referred him to see Lorne Skeens Close NP at [facility]. On 12-21-21 his creatinine was 1.50, BUN was 36, and GFR was 48. She confirmed he has CKD, and she ordered many other tests to investigate this. I cannot access these results today. She told him to stop taking his Meloxicam and to see [place]. He feels well in general. No family hx of renal problems. He does not smoke. He normally drinks some alcohol every day, but he says he stopped all alcohol 2 weeks ago. His diabetes has been well controlled. His last A1c a week ago was 5.6%. So far his potassium level has been normal. His HTN is well controlled.  ? ? ?Review of Systems  ?Constitutional: Negative.   ?Respiratory: Negative.    ?Cardiovascular: Negative.   ? ?   ?Objective:  ? Physical Exam ?Constitutional:   ?   Appearance: Normal appearance. He is not ill-appearing.  ?Cardiovascular:  ?   Rate and Rhythm: Normal rate and regular rhythm.  ?   Pulses: Normal pulses.  ?   Heart sounds: Normal heart sounds.  ?Pulmonary:  ?   Effort: Pulmonary effort is normal.  ?   Breath sounds: Normal breath sounds.  ?Neurological:  ?   Mental Status: He is alert.  ? ? ? ? ? ?   ?Assessment & Plan:  ?New onset of CKD stage 3. We agreed to stop his Metformin and his [place] Glade for now, and he will monitor his glucoses TID at home and then report back in 3 weeks. He will stop the Meloxicam, and he will avoid all OTC NSAID products. He will drink plenty of water every day.  ?Alysia Penna, MD ? ? ?

## 2022-01-03 ENCOUNTER — Encounter: Payer: Self-pay | Admitting: Family Medicine

## 2022-01-03 ENCOUNTER — Other Ambulatory Visit: Payer: Self-pay

## 2022-01-03 MED ORDER — GLIPIZIDE 5 MG PO TABS: 5.0000 mg | ORAL_TABLET | Freq: Two times a day (BID) | ORAL | 5 refills | Status: DC

## 2022-01-03 NOTE — Telephone Encounter (Signed)
Spoke with pt aware of Dr Sarajane Jews recommendation, New Rx for Glipizide 5 mg sent to pharmacy ?

## 2022-01-03 NOTE — Telephone Encounter (Signed)
I agree he should start back on something very mild for the diabetes. Call in Glipizide 5 mg BID, #60 with 5 rf  ?

## 2022-01-04 ENCOUNTER — Encounter: Payer: Self-pay | Admitting: Family Medicine

## 2022-01-04 DIAGNOSIS — M129 Arthropathy, unspecified: Secondary | ICD-10-CM

## 2022-01-04 DIAGNOSIS — Z961 Presence of intraocular lens: Secondary | ICD-10-CM | POA: Diagnosis not present

## 2022-01-04 DIAGNOSIS — H3581 Retinal edema: Secondary | ICD-10-CM | POA: Diagnosis not present

## 2022-01-04 DIAGNOSIS — H30033 Focal chorioretinal inflammation, peripheral, bilateral: Secondary | ICD-10-CM | POA: Diagnosis not present

## 2022-01-04 DIAGNOSIS — E119 Type 2 diabetes mellitus without complications: Secondary | ICD-10-CM | POA: Diagnosis not present

## 2022-01-05 NOTE — Telephone Encounter (Signed)
He should check with his rheumatologist about this  ?

## 2022-01-05 NOTE — Telephone Encounter (Signed)
My apologies! I was thinking he had a rheumatologist since he is taking Methotrexate, but this is prescribed by his ophthalmologist. I went ahead and referred him to [facility]. In the meantime he can try applying Voltaren 1% gel on his hands up to 4 times a day. This is safe for his kidneys.  ?

## 2022-01-06 NOTE — Telephone Encounter (Signed)
Noted  

## 2022-01-18 DIAGNOSIS — L570 Actinic keratosis: Secondary | ICD-10-CM | POA: Diagnosis not present

## 2022-01-18 DIAGNOSIS — D0439 Carcinoma in situ of skin of other parts of face: Secondary | ICD-10-CM | POA: Diagnosis not present

## 2022-01-20 ENCOUNTER — Encounter: Payer: Self-pay | Admitting: Family Medicine

## 2022-01-20 DIAGNOSIS — M5137 Other intervertebral disc degeneration, lumbosacral region: Secondary | ICD-10-CM | POA: Diagnosis not present

## 2022-01-20 DIAGNOSIS — M50321 Other cervical disc degeneration at C4-C5 level: Secondary | ICD-10-CM | POA: Diagnosis not present

## 2022-01-20 DIAGNOSIS — M9903 Segmental and somatic dysfunction of lumbar region: Secondary | ICD-10-CM | POA: Diagnosis not present

## 2022-01-20 DIAGNOSIS — M50323 Other cervical disc degeneration at C6-C7 level: Secondary | ICD-10-CM | POA: Diagnosis not present

## 2022-01-20 NOTE — Telephone Encounter (Signed)
Spoke with pt advised and provided the contact to the Rheumatology office, state that he will call them today ?

## 2022-01-26 DIAGNOSIS — M50321 Other cervical disc degeneration at C4-C5 level: Secondary | ICD-10-CM | POA: Diagnosis not present

## 2022-01-26 DIAGNOSIS — M5137 Other intervertebral disc degeneration, lumbosacral region: Secondary | ICD-10-CM | POA: Diagnosis not present

## 2022-01-26 DIAGNOSIS — M9903 Segmental and somatic dysfunction of lumbar region: Secondary | ICD-10-CM | POA: Diagnosis not present

## 2022-01-26 DIAGNOSIS — M50323 Other cervical disc degeneration at C6-C7 level: Secondary | ICD-10-CM | POA: Diagnosis not present

## 2022-02-01 ENCOUNTER — Telehealth: Payer: Self-pay

## 2022-02-01 NOTE — Telephone Encounter (Signed)
Pt Last office notes faxed to [facility] as requested for pt referral

## 2022-02-02 DIAGNOSIS — M50323 Other cervical disc degeneration at C6-C7 level: Secondary | ICD-10-CM | POA: Diagnosis not present

## 2022-02-02 DIAGNOSIS — M9903 Segmental and somatic dysfunction of lumbar region: Secondary | ICD-10-CM | POA: Diagnosis not present

## 2022-02-02 DIAGNOSIS — M50321 Other cervical disc degeneration at C4-C5 level: Secondary | ICD-10-CM | POA: Diagnosis not present

## 2022-02-02 DIAGNOSIS — M5137 Other intervertebral disc degeneration, lumbosacral region: Secondary | ICD-10-CM | POA: Diagnosis not present

## 2022-02-03 MED ORDER — TRAMADOL HCL 50 MG PO TABS: 100.0000 mg | ORAL_TABLET | Freq: Four times a day (QID) | ORAL | 1 refills | Status: DC | PRN

## 2022-02-03 NOTE — Telephone Encounter (Signed)
Have him call [facility] directly to see if they can speed things up. In the meantime he can use Tramadol for pain. This is safe for his kidneys. I will send in a RX to his pharmacy

## 2022-02-09 DIAGNOSIS — M5137 Other intervertebral disc degeneration, lumbosacral region: Secondary | ICD-10-CM | POA: Diagnosis not present

## 2022-02-09 DIAGNOSIS — M50323 Other cervical disc degeneration at C6-C7 level: Secondary | ICD-10-CM | POA: Diagnosis not present

## 2022-02-09 DIAGNOSIS — M9903 Segmental and somatic dysfunction of lumbar region: Secondary | ICD-10-CM | POA: Diagnosis not present

## 2022-02-09 DIAGNOSIS — M50321 Other cervical disc degeneration at C4-C5 level: Secondary | ICD-10-CM | POA: Diagnosis not present

## 2022-02-13 ENCOUNTER — Other Ambulatory Visit: Payer: Self-pay | Admitting: Family Medicine

## 2022-02-14 DIAGNOSIS — H44113 Panuveitis, bilateral: Secondary | ICD-10-CM | POA: Diagnosis not present

## 2022-02-14 DIAGNOSIS — Z79899 Other long term (current) drug therapy: Secondary | ICD-10-CM | POA: Diagnosis not present

## 2022-02-14 DIAGNOSIS — H30033 Focal chorioretinal inflammation, peripheral, bilateral: Secondary | ICD-10-CM | POA: Diagnosis not present

## 2022-02-14 NOTE — Telephone Encounter (Signed)
Last refill Zolpidem- 10/05/21-90 tabs, 1 refill Last OV- 12/24/21  No future OV scheduled.

## 2022-02-15 ENCOUNTER — Encounter: Payer: Self-pay | Admitting: Family Medicine

## 2022-02-16 DIAGNOSIS — M50323 Other cervical disc degeneration at C6-C7 level: Secondary | ICD-10-CM | POA: Diagnosis not present

## 2022-02-16 DIAGNOSIS — M50321 Other cervical disc degeneration at C4-C5 level: Secondary | ICD-10-CM | POA: Diagnosis not present

## 2022-02-16 DIAGNOSIS — M5137 Other intervertebral disc degeneration, lumbosacral region: Secondary | ICD-10-CM | POA: Diagnosis not present

## 2022-02-16 DIAGNOSIS — M9903 Segmental and somatic dysfunction of lumbar region: Secondary | ICD-10-CM | POA: Diagnosis not present

## 2022-02-16 NOTE — Telephone Encounter (Signed)
I agree it is frustrating. Have him call [facility] to see what the problem is at [phone]

## 2022-03-08 DIAGNOSIS — C44329 Squamous cell carcinoma of skin of other parts of face: Secondary | ICD-10-CM | POA: Diagnosis not present

## 2022-03-09 DIAGNOSIS — M9903 Segmental and somatic dysfunction of lumbar region: Secondary | ICD-10-CM | POA: Diagnosis not present

## 2022-03-09 DIAGNOSIS — M50321 Other cervical disc degeneration at C4-C5 level: Secondary | ICD-10-CM | POA: Diagnosis not present

## 2022-03-09 DIAGNOSIS — M50323 Other cervical disc degeneration at C6-C7 level: Secondary | ICD-10-CM | POA: Diagnosis not present

## 2022-03-09 DIAGNOSIS — M5137 Other intervertebral disc degeneration, lumbosacral region: Secondary | ICD-10-CM | POA: Diagnosis not present

## 2022-03-18 ENCOUNTER — Telehealth: Payer: Self-pay | Admitting: Family Medicine

## 2022-03-18 ENCOUNTER — Other Ambulatory Visit (INDEPENDENT_AMBULATORY_CARE_PROVIDER_SITE_OTHER): Payer: BC Managed Care – PPO

## 2022-03-18 DIAGNOSIS — N1831 Chronic kidney disease, stage 3a: Secondary | ICD-10-CM | POA: Diagnosis not present

## 2022-03-18 DIAGNOSIS — H401112 Primary open-angle glaucoma, right eye, moderate stage: Secondary | ICD-10-CM | POA: Diagnosis not present

## 2022-03-18 DIAGNOSIS — H401123 Primary open-angle glaucoma, left eye, severe stage: Secondary | ICD-10-CM | POA: Diagnosis not present

## 2022-03-18 DIAGNOSIS — E119 Type 2 diabetes mellitus without complications: Secondary | ICD-10-CM

## 2022-03-18 DIAGNOSIS — H20021 Recurrent acute iridocyclitis, right eye: Secondary | ICD-10-CM | POA: Diagnosis not present

## 2022-03-18 DIAGNOSIS — H43812 Vitreous degeneration, left eye: Secondary | ICD-10-CM | POA: Diagnosis not present

## 2022-03-18 LAB — BASIC METABOLIC PANEL
BUN: 25 mg/dL — ABNORMAL HIGH (ref 6–23)
CO2: 27 mEq/L (ref 19–32)
Calcium: 10.3 mg/dL (ref 8.4–10.5)
Chloride: 101 mEq/L (ref 96–112)
Creatinine, Ser: 1.19 mg/dL (ref 0.40–1.50)
GFR: 59.23 mL/min — ABNORMAL LOW (ref >60.00)
Glucose, Bld: 107 mg/dL — ABNORMAL HIGH (ref 70–99)
Potassium: 4.5 mEq/L (ref 3.5–5.1)
Sodium: 135 mEq/L (ref 135–145)

## 2022-03-18 NOTE — Telephone Encounter (Signed)
Here today for labs

## 2022-03-23 DIAGNOSIS — M9903 Segmental and somatic dysfunction of lumbar region: Secondary | ICD-10-CM | POA: Diagnosis not present

## 2022-03-23 DIAGNOSIS — M50321 Other cervical disc degeneration at C4-C5 level: Secondary | ICD-10-CM | POA: Diagnosis not present

## 2022-03-23 DIAGNOSIS — M50323 Other cervical disc degeneration at C6-C7 level: Secondary | ICD-10-CM | POA: Diagnosis not present

## 2022-03-23 DIAGNOSIS — M5137 Other intervertebral disc degeneration, lumbosacral region: Secondary | ICD-10-CM | POA: Diagnosis not present

## 2022-03-27 ENCOUNTER — Encounter: Payer: Self-pay | Admitting: Cardiovascular Disease

## 2022-03-27 NOTE — Progress Notes (Unsigned)
Cardiology Office Note:    Date:  03/29/2022   ID:  [name] Emerald., DOB 1946/01/03, MRN [id number]  PCP:  Laurey Morale, MD  Cardiologist:  Yulonda Wheeling  Electrophysiologist:  None   Referring MD: Laurey Morale, MD   Chief Complaint  Patient presents with   Hypertension          Prior Notes;    [name] Ruhlman. is a 76 y.o. male with a hx of HTN, hyperlipidemia, palpitations. Takes Humira for Iritis   He has a strong family hx of atrial fib ( father and sister )   The palptations seem to be worse with lack of sleep and before he eats .  Feels like his heart stops for a second or so ,  Then might have another episode less than a minute later.   No syncope or near syncope  Walks regularly ,  3-4 times a week .  2 miles at a time .  Feels well with walking .   BP is well controlled.   Aug. 5, 2022 [name] [name] is seen today for follow up of his hypertension and history of palpitations.  He also has a history of hyperlipidemia.  Event monitor showed occasional PVCs, no atrial fib   Has developed a neuropathy Is on gabapentin - which helps with the pain   March 29, 2022 [name] [name] is seen for folllow up of his HLD, HTN Echo from June, 2021: Normal LV systolic function, mild diastolic dysfunction. Minimal MR and AI Event monitor shows occasional PVCs No evidence of atrial fib   Has some mild orthostatic hypotension.  Not all that limiting  Advised better hydration ( V-8 juice in the morning , Liquid IV in his water )    Past Medical History:  Diagnosis Date   Cataract    Bil/ right eye surgery   Diabetes mellitus    type II   Diverticulitis of colon    ED (erectile dysfunction)    Gout    no meds   H/O echocardiogram 2013   normal    History of irregular heartbeat    Per pt, heart skips beats at times/had Echo   Hyperlipidemia    Hypertension    Iritis, recurrent     sees Dr. Silvestre Gunner at [facility]    Normal cardiac stress test 09/27/2002   Palpitations    PVCs and  PACs per event monitor   Uveitis    sees Dr Polly Cobia at [facility] Dr Manuella Ghazi now    Past Surgical History:  Procedure Laterality Date   APPENDECTOMY     CATARACT EXTRACTION     Dr Katy Fitch , left eye/ 2 years ago   CATARACT EXTRACTION  03/16/2017   Right eye   COLONOSCOPY  04/20/2017   per Dr. Loletha Carrow, adenomatous polyps, repeat in 5 yrs    GANGLION CYST EXCISION Right 11/29/2021   Procedure: RIGHT REMOVAL GANGLION OF WRIST;  Surgeon: Sherilyn Cooter, MD;  Location: [place];  Service: Orthopedics;  Laterality: Right;   SHOULDER ARTHROSCOPY     both shoulders    TONSILLECTOMY     TONSILLECTOMY AND ADENOIDECTOMY     tubes in ears     55 years ago    Current Medications: Current Meds  Medication Sig   Adalimumab 40 MG/0.4ML PSKT Inject into the skin. Humira 40 mg SQ every other week   allopurinol (ZYLOPRIM) 100 MG tablet Take 1 tablet (100 mg total) by mouth  daily.   brimonidine (ALPHAGAN) 0.2 % ophthalmic solution Place 1 drop into both eyes as needed. Used 1 or 2 a year   calcium-vitamin D (OSCAL WITH D) 500-200 MG-UNIT per tablet Take 2 tablets by mouth daily.   colchicine 0.6 MG tablet Take 1 tablet (0.6 mg total) by mouth every 6 (six) hours as needed (gout).   CONTOUR NEXT TEST test strip TEST BLOOD SUGAR THREE TO FOUR TIMES DAILY   FOLIC ACID PO Take 10 mg by mouth daily.    glipiZIDE (GLUCOTROL) 5 MG tablet Take 1 tablet (5 mg total) by mouth 2 (two) times daily before a meal.   lisinopril (ZESTRIL) 20 MG tablet Take 0.5 tablets (10 mg total) by mouth daily.   methotrexate (RHEUMATREX) 2.5 MG tablet Take 4 tablets by mouth once a week.   Microlet Lancets MISC TEST DAILY   Multiple Vitamin (MULTIVITAMIN) tablet Take 1 tablet by mouth daily.   rosuvastatin (CRESTOR) 20 MG tablet TAKE ONE TABLET BY MOUTH DAILY   traMADol (ULTRAM) 50 MG tablet Take 2 tablets (100 mg total) by mouth every 6 (six) hours as needed for severe pain.   zolpidem (AMBIEN) 10 MG  tablet TAKE 1 TABLET(10 MG) BY MOUTH AT BEDTIME   Current Facility-Administered Medications for the 03/29/22 encounter (Office Visit) with Rocsi Hazelbaker, Wonda Cheng, MD  Medication   0.9 %  sodium chloride infusion     Allergies:   Contrast media [iodinated contrast media], Red dye, Fluorescein, and Ioxaglate   Social History   Socioeconomic History   Marital status: Married    Spouse name: Not on file   Number of children: 1   Years of education: Not on file   Highest education level: Bachelor's degree (e.g., BA, AB, BS)  Occupational History   Occupation: Nurse, learning disability  Tobacco Use   Smoking status: Former    Packs/day: 1.00    Years: 8.00    Total pack years: 8.00    Types: Cigarettes    Quit date: 02/06/1970    Years since quitting: 52.1   Smokeless tobacco: Never  Substance and Sexual Activity   Alcohol use: Yes    Alcohol/week: 7.0 standard drinks of alcohol    Types: 7 Standard drinks or equivalent per week    Comment: glass of wine most days   Drug use: No   Sexual activity: Not on file  Other Topics Concern   Not on file  Social History Narrative   Not on file   Social Determinants of Health   Financial Resource Strain: Low Risk  (08/21/2021)   Overall Financial Resource Strain (CARDIA)    Difficulty of Paying Living Expenses: Not hard at all  Food Insecurity: No Food Insecurity (08/21/2021)   Hunger Vital Sign    Worried About Running Out of Food in the Last Year: Never true    Ran Out of Food in the Last Year: Never true  Transportation Needs: No Transportation Needs (08/21/2021)   PRAPARE - Hydrologist (Medical): No    Lack of Transportation (Non-Medical): No  Physical Activity: Insufficiently Active (08/21/2021)   Exercise Vital Sign    Days of Exercise per Week: 3 days    Minutes of Exercise per Session: 20 min  Stress: No Stress Concern Present (08/21/2021)   [place]    Feeling of Stress : Only a little  Social Connections: Socially Integrated (08/21/2021)   Social Connection and  Isolation Panel [NHANES]    Frequency of Communication with Friends and Family: More than three times a week    Frequency of Social Gatherings with Friends and Family: More than three times a week    Attends Religious Services: More than 4 times per year    Active Member of [employer] or Organizations: Yes    Attends Music therapist: More than 4 times per year    Marital Status: Married     Family History: The patient's family history includes Colon cancer in his father; Diabetes in his paternal grandfather; Heart attack in his paternal grandmother; Heart attack (age of onset: 13) in his father; Heart disease in his father and mother; Kidney disease in his father; Thyroid cancer in his brother and mother. There is no history of Stomach cancer.  ROS:   Please see the history of present illness.     All other systems reviewed and are negative.  EKGs/Labs/Other Studies Reviewed:    The following studies were reviewed today:   Recent Labs: 05/26/2021: ALT 30; Hemoglobin 14.5; Platelets 141.0; TSH 1.44 03/18/2022: BUN 25; Creatinine, Ser 1.19; Potassium 4.5; Sodium 135  Recent Lipid Panel    Component Value Date/Time   CHOL 130 05/26/2021 0846   TRIG 246.0 (H) 05/26/2021 0846   TRIG 284 (HH) 09/20/2006 0929   HDL 39.50 05/26/2021 0846   CHOLHDL 3 05/26/2021 0846   VLDL 49.2 (H) 05/26/2021 0846   LDLCALC 71 05/21/2020 0833   LDLDIRECT 58.0 05/26/2021 0846    Physical Exam:     Physical Exam: Blood pressure 120/70, pulse (!) 57, height '6\' 2"'$  (1.88 m), weight 180 lb 6.4 oz (81.8 kg), SpO2 99 %.  GEN:  Well nourished, well developed in no acute distress HEENT: Normal NECK: No JVD; No carotid bruits LYMPHATICS: No lymphadenopathy CARDIAC: RRR , no murmurs, rubs, gallops RESPIRATORY:  Clear to auscultation without rales, wheezing or rhonchi   ABDOMEN: Soft, non-tender, non-distended MUSCULOSKELETAL:  No edema; No deformity  SKIN: Warm and dry NEUROLOGIC:  Alert and oriented x 3  EKG:     March 29, 2022: Sinus bradycardia 57.  First-degree AV block.  ASSESSMENT:    1. Orthostatic hypotension   2. Palpitations      PLAN:       PVCs:   no recent palpitations:   2.  First-degree AV block:    3.  Hypertensive heart disease without congestive heart failure:  He is on lisinopril 10 mg today.  Here in the office his blood pressure is well controlled.  He does describe some his current intermittent episodes of orthostatic hypotension.  These seem to be more pronounced especially when is hot outside.  I encouraged him to start his day with a V8 juice.  He can also add electrolytes to his drinking water.  I suggested liquid IV or Nuun tablets for electrolyte replacement.  If the orthostatic hypotension becomes more severe we can consider stopping the lisinopril.    4.  Hyperlipidemia: Labs have been stable.  Continue current medications     Medication Adjustments/Labs and Tests Ordered: Current medicines are reviewed at length with the patient today.  Concerns regarding medicines are outlined above.  Orders Placed This Encounter  Procedures   EKG 12-Lead    No orders of the defined types were placed in this encounter.       Signed, Mertie Moores, MD  03/29/2022 5:33 PM    [place]

## 2022-03-29 ENCOUNTER — Ambulatory Visit (INDEPENDENT_AMBULATORY_CARE_PROVIDER_SITE_OTHER): Payer: BC Managed Care – PPO | Admitting: Cardiovascular Disease

## 2022-03-29 ENCOUNTER — Encounter: Payer: Self-pay | Admitting: Cardiovascular Disease

## 2022-03-29 VITALS — BP 120/70 | HR 57 | Ht 74.0 in | Wt 180.4 lb

## 2022-03-29 DIAGNOSIS — R002 Palpitations: Secondary | ICD-10-CM

## 2022-03-29 DIAGNOSIS — I951 Orthostatic hypotension: Secondary | ICD-10-CM | POA: Diagnosis not present

## 2022-03-29 NOTE — Patient Instructions (Signed)
Medication Instructions:  Your physician recommends that you continue on your current medications as directed. Please refer to the Current Medication list given to you today.  *If you need a refill on your cardiac medications before your next appointment, please call your pharmacy*   Lab Work: NONE If you have labs (blood work) drawn today and your tests are completely normal, you will receive your results only by: [place] (if you have MyChart) OR A paper copy in the mail If you have any lab test that is abnormal or we need to change your treatment, we will call you to review the results.   Testing/Procedures: NONE   Follow-Up: At [facility], you and your health needs are our priority.  As part of our continuing mission to provide you with exceptional heart care, we have created designated Provider Care Teams.  These Care Teams include your primary Cardiologist (physician) and Advanced Practice Providers (APPs -  Physician Assistants and Nurse Practitioners) who all work together to provide you with the care you need, when you need it.  We recommend signing up for the patient portal called "MyChart".  Sign up information is provided on this After Visit Summary.  MyChart is used to connect with patients for Virtual Visits (Telemedicine).  Patients are able to view lab/test results, encounter notes, upcoming appointments, etc.  Non-urgent messages can be sent to your provider as well.   To learn more about what you can do with MyChart, go to [email or web address].    Your next appointment:   1 year(s)  The format for your next appointment:   In Person  Provider:   Ronn Melena, or Nahser {    Important Information About Sugar

## 2022-04-01 DIAGNOSIS — E119 Type 2 diabetes mellitus without complications: Secondary | ICD-10-CM | POA: Diagnosis not present

## 2022-04-01 DIAGNOSIS — H3581 Retinal edema: Secondary | ICD-10-CM | POA: Diagnosis not present

## 2022-04-01 DIAGNOSIS — H30033 Focal chorioretinal inflammation, peripheral, bilateral: Secondary | ICD-10-CM | POA: Diagnosis not present

## 2022-04-01 DIAGNOSIS — Z961 Presence of intraocular lens: Secondary | ICD-10-CM | POA: Diagnosis not present

## 2022-04-04 DIAGNOSIS — M5137 Other intervertebral disc degeneration, lumbosacral region: Secondary | ICD-10-CM | POA: Diagnosis not present

## 2022-04-04 DIAGNOSIS — M50321 Other cervical disc degeneration at C4-C5 level: Secondary | ICD-10-CM | POA: Diagnosis not present

## 2022-04-04 DIAGNOSIS — M50323 Other cervical disc degeneration at C6-C7 level: Secondary | ICD-10-CM | POA: Diagnosis not present

## 2022-04-04 DIAGNOSIS — M9903 Segmental and somatic dysfunction of lumbar region: Secondary | ICD-10-CM | POA: Diagnosis not present

## 2022-04-08 DIAGNOSIS — Z9889 Other specified postprocedural states: Secondary | ICD-10-CM | POA: Diagnosis not present

## 2022-04-08 DIAGNOSIS — H6123 Impacted cerumen, bilateral: Secondary | ICD-10-CM | POA: Diagnosis not present

## 2022-04-27 DIAGNOSIS — M9903 Segmental and somatic dysfunction of lumbar region: Secondary | ICD-10-CM | POA: Diagnosis not present

## 2022-04-27 DIAGNOSIS — M5137 Other intervertebral disc degeneration, lumbosacral region: Secondary | ICD-10-CM | POA: Diagnosis not present

## 2022-04-27 DIAGNOSIS — M50323 Other cervical disc degeneration at C6-C7 level: Secondary | ICD-10-CM | POA: Diagnosis not present

## 2022-04-27 DIAGNOSIS — M50321 Other cervical disc degeneration at C4-C5 level: Secondary | ICD-10-CM | POA: Diagnosis not present

## 2022-05-14 ENCOUNTER — Other Ambulatory Visit: Payer: Self-pay | Admitting: Family Medicine

## 2022-05-17 NOTE — Telephone Encounter (Signed)
Pt LOV was on 12/24/2021 Last refill was done on 02/03/2022 Please advise

## 2022-05-18 DIAGNOSIS — M50321 Other cervical disc degeneration at C4-C5 level: Secondary | ICD-10-CM | POA: Diagnosis not present

## 2022-05-18 DIAGNOSIS — M5137 Other intervertebral disc degeneration, lumbosacral region: Secondary | ICD-10-CM | POA: Diagnosis not present

## 2022-05-18 DIAGNOSIS — M9903 Segmental and somatic dysfunction of lumbar region: Secondary | ICD-10-CM | POA: Diagnosis not present

## 2022-05-18 DIAGNOSIS — M50323 Other cervical disc degeneration at C6-C7 level: Secondary | ICD-10-CM | POA: Diagnosis not present

## 2022-05-25 ENCOUNTER — Encounter: Payer: Self-pay | Admitting: Family Medicine

## 2022-05-25 NOTE — Telephone Encounter (Signed)
We can give the flu shot here but he will need to get the Covid vaccine at his pharmacy

## 2022-05-30 ENCOUNTER — Encounter: Payer: Self-pay | Admitting: Family Medicine

## 2022-05-30 ENCOUNTER — Ambulatory Visit (INDEPENDENT_AMBULATORY_CARE_PROVIDER_SITE_OTHER): Payer: BC Managed Care – PPO | Admitting: Family Medicine

## 2022-05-30 VITALS — BP 98/60 | HR 65 | Temp 98.0°F | Ht 73.0 in | Wt 180.0 lb

## 2022-05-30 DIAGNOSIS — Z125 Encounter for screening for malignant neoplasm of prostate: Secondary | ICD-10-CM

## 2022-05-30 DIAGNOSIS — Z0000 Encounter for general adult medical examination without abnormal findings: Secondary | ICD-10-CM

## 2022-05-30 DIAGNOSIS — Z23 Encounter for immunization: Secondary | ICD-10-CM

## 2022-05-30 DIAGNOSIS — M1A9XX0 Chronic gout, unspecified, without tophus (tophi): Secondary | ICD-10-CM

## 2022-05-30 LAB — BASIC METABOLIC PANEL
BUN: 32 mg/dL — ABNORMAL HIGH (ref 6–23)
CO2: 25 mEq/L (ref 19–32)
Calcium: 9.5 mg/dL (ref 8.4–10.5)
Chloride: 102 mEq/L (ref 96–112)
Creatinine, Ser: 1.36 mg/dL (ref 0.40–1.50)
GFR: 50.39 mL/min — ABNORMAL LOW (ref >60.00)
Glucose, Bld: 164 mg/dL — ABNORMAL HIGH (ref 70–99)
Potassium: 4.5 mEq/L (ref 3.5–5.1)
Sodium: 136 mEq/L (ref 135–145)

## 2022-05-30 LAB — CBC WITH DIFFERENTIAL/PLATELET
Basophils Absolute: 0.1 10*3/uL (ref 0.0–0.1)
Basophils Relative: 1 % (ref 0.0–3.0)
Eosinophils Absolute: 0.2 10*3/uL (ref 0.0–0.7)
Eosinophils Relative: 4.4 % (ref 0.0–5.0)
HCT: 41.8 % (ref 39.0–52.0)
Hemoglobin: 14.2 g/dL (ref 13.0–17.0)
Lymphocytes Relative: 31.2 % (ref 12.0–46.0)
Lymphs Abs: 1.7 10*3/uL (ref 0.7–4.0)
MCHC: 34.1 g/dL (ref 30.0–36.0)
MCV: 96.1 fl (ref 78.0–100.0)
Monocytes Absolute: 0.4 10*3/uL (ref 0.1–1.0)
Monocytes Relative: 7.8 % (ref 3.0–12.0)
Neutro Abs: 3 10*3/uL (ref 1.4–7.7)
Neutrophils Relative %: 55.6 % (ref 43.0–77.0)
Platelets: 156 10*3/uL (ref 150.0–400.0)
RBC: 4.34 Mil/uL (ref 4.22–5.81)
RDW: 13.5 % (ref 11.5–15.5)
WBC: 5.4 10*3/uL (ref 4.0–10.5)

## 2022-05-30 LAB — LIPID PANEL
Cholesterol: 139 mg/dL (ref 0–200)
HDL: 40 mg/dL (ref >39.00)
LDL Cholesterol: 76 mg/dL (ref 0–99)
NonHDL: 99.01
Total CHOL/HDL Ratio: 3
Triglycerides: 115 mg/dL (ref 0.0–149.0)
VLDL: 23 mg/dL (ref 0.0–40.0)

## 2022-05-30 LAB — HEPATIC FUNCTION PANEL
ALT: 21 U/L (ref 0–53)
AST: 15 U/L (ref 0–37)
Albumin: 4.3 g/dL (ref 3.5–5.2)
Alkaline Phosphatase: 63 U/L (ref 39–117)
Bilirubin, Direct: 0.2 mg/dL (ref 0.0–0.3)
Total Bilirubin: 1.1 mg/dL (ref 0.2–1.2)
Total Protein: 6.5 g/dL (ref 6.0–8.3)

## 2022-05-30 LAB — PSA: PSA: 2.25 ng/mL (ref 0.10–4.00)

## 2022-05-30 LAB — HEMOGLOBIN A1C: Hgb A1c MFr Bld: 8.1 % — ABNORMAL HIGH (ref 4.6–6.5)

## 2022-05-30 LAB — URIC ACID: Uric Acid, Serum: 6 mg/dL (ref 4.0–7.8)

## 2022-05-30 LAB — TSH: TSH: 1.56 u[IU]/mL (ref 0.35–5.50)

## 2022-05-30 MED ORDER — LISINOPRIL 10 MG PO TABS: 10.0000 mg | ORAL_TABLET | Freq: Every day | ORAL | 3 refills | Status: DC

## 2022-05-30 MED ORDER — GLIPIZIDE 5 MG PO TABS: 5.0000 mg | ORAL_TABLET | Freq: Two times a day (BID) | ORAL | 3 refills | Status: DC

## 2022-05-30 NOTE — Progress Notes (Signed)
Subjective:    Patient ID: [name] [name]., male    DOB: 25-Feb-1946, 76 y.o.   MRN: [id number]  HPI Here for a well exam. He feels well and has no complaints. He sees his ophthalmologist regularly, and he prescribes Humira and methotrexate for his uveitis.    Review of Systems  Constitutional: Negative.   HENT: Negative.    Eyes: Negative.   Respiratory: Negative.    Cardiovascular: Negative.   Gastrointestinal: Negative.   Genitourinary: Negative.   Musculoskeletal: Negative.   Skin: Negative.   Neurological: Negative.   Psychiatric/Behavioral: Negative.         Objective:   Physical Exam Constitutional:      General: He is not in acute distress.    Appearance: Normal appearance. He is well-developed. He is not diaphoretic.  HENT:     Head: Normocephalic and atraumatic.     Right Ear: External ear normal.     Left Ear: External ear normal.     Nose: Nose normal.     Mouth/Throat:     Pharynx: No oropharyngeal exudate.  Eyes:     General: No scleral icterus.       Right eye: No discharge.        Left eye: No discharge.     Conjunctiva/sclera: Conjunctivae normal.     Pupils: Pupils are equal, round, and reactive to light.  Neck:     Thyroid: No thyromegaly.     Vascular: No JVD.     Trachea: No tracheal deviation.  Cardiovascular:     Rate and Rhythm: Normal rate and regular rhythm.     Heart sounds: Normal heart sounds. No murmur heard.    No friction rub. No gallop.  Pulmonary:     Effort: Pulmonary effort is normal. No respiratory distress.     Breath sounds: Normal breath sounds. No wheezing or rales.  Chest:     Chest wall: No tenderness.  Abdominal:     General: Bowel sounds are normal. There is no distension.     Palpations: Abdomen is soft. There is no mass.     Tenderness: There is no abdominal tenderness. There is no guarding or rebound.  Genitourinary:    Penis: Normal. No tenderness.      Testes: Normal.     Prostate: Normal.      Rectum: Normal. Guaiac result negative.  Musculoskeletal:        General: No tenderness. Normal range of motion.     Cervical back: Neck supple.  Lymphadenopathy:     Cervical: No cervical adenopathy.  Skin:    General: Skin is warm and dry.     Coloration: Skin is not pale.     Findings: No erythema or rash.  Neurological:     Mental Status: He is alert and oriented to person, place, and time.     Cranial Nerves: No cranial nerve deficit.     Motor: No abnormal muscle tone.     Coordination: Coordination normal.     Deep Tendon Reflexes: Reflexes are normal and symmetric. Reflexes normal.  Psychiatric:        Behavior: Behavior normal.        Thought Content: Thought content normal.        Judgment: Judgment normal.           Assessment & Plan:  Well exam. We discussed diet and exercise. Get fasting labs. He is past due for a colonoscopy, we will arrange for  this.  Alysia Penna, MD

## 2022-05-31 ENCOUNTER — Encounter: Payer: Self-pay | Admitting: Gastroenterology

## 2022-05-31 ENCOUNTER — Other Ambulatory Visit: Payer: Self-pay

## 2022-05-31 DIAGNOSIS — Z85828 Personal history of other malignant neoplasm of skin: Secondary | ICD-10-CM | POA: Diagnosis not present

## 2022-05-31 DIAGNOSIS — L812 Freckles: Secondary | ICD-10-CM | POA: Diagnosis not present

## 2022-05-31 DIAGNOSIS — L821 Other seborrheic keratosis: Secondary | ICD-10-CM | POA: Diagnosis not present

## 2022-05-31 DIAGNOSIS — L570 Actinic keratosis: Secondary | ICD-10-CM | POA: Diagnosis not present

## 2022-05-31 MED ORDER — GLIPIZIDE 10 MG PO TABS: 10.0000 mg | ORAL_TABLET | Freq: Two times a day (BID) | ORAL | 3 refills | Status: DC

## 2022-06-01 ENCOUNTER — Encounter: Payer: Self-pay | Admitting: Family Medicine

## 2022-06-01 ENCOUNTER — Other Ambulatory Visit: Payer: Self-pay | Admitting: Family Medicine

## 2022-06-02 NOTE — Telephone Encounter (Signed)
OK. We dated the form and will fax it to them again

## 2022-06-06 ENCOUNTER — Other Ambulatory Visit: Payer: Self-pay | Admitting: Family Medicine

## 2022-06-06 ENCOUNTER — Telehealth: Payer: Self-pay | Admitting: Gastroenterology

## 2022-06-06 NOTE — Telephone Encounter (Signed)
Thank you for the note.  I reviewed his most recent cardiology office note from July of this year, and his blood pressure and palpitations (PVCs) are stable.  He can be directly booked for colonoscopy with me in the [facility].  -HD

## 2022-06-06 NOTE — Telephone Encounter (Signed)
Hi Dr. Loletha Carrow,  Patient called to schedule his recall, would you want to see him in the office first or would we be able to schedule please advise.  Thanks

## 2022-06-08 ENCOUNTER — Encounter: Payer: Self-pay | Admitting: Family Medicine

## 2022-06-08 ENCOUNTER — Encounter: Payer: Self-pay | Admitting: Gastroenterology

## 2022-06-08 DIAGNOSIS — M50323 Other cervical disc degeneration at C6-C7 level: Secondary | ICD-10-CM | POA: Diagnosis not present

## 2022-06-08 DIAGNOSIS — M5137 Other intervertebral disc degeneration, lumbosacral region: Secondary | ICD-10-CM | POA: Diagnosis not present

## 2022-06-08 DIAGNOSIS — M50321 Other cervical disc degeneration at C4-C5 level: Secondary | ICD-10-CM | POA: Diagnosis not present

## 2022-06-08 DIAGNOSIS — M9903 Segmental and somatic dysfunction of lumbar region: Secondary | ICD-10-CM | POA: Diagnosis not present

## 2022-06-14 ENCOUNTER — Telehealth: Payer: Self-pay

## 2022-06-14 NOTE — Telephone Encounter (Signed)
Dr. Loletha Carrow,  This patient is scheduled for  recall colonoscopy.  He is 76 yo and there is no Recall assessment sheet attached in chart. I am just making sure that he is ok to proceed as scheduled or does he need an OV?  Thanks, Johncharles Fusselman/Previsit

## 2022-06-14 NOTE — Telephone Encounter (Signed)
Thank you for the note.  Yes, he is due for surveillance colonoscopy due to history of polyp on his 2018 colonoscopy.  He can be directly booked for procedure with me in the [facility] (please refer to my 06/06/2022 telephone note).  HD

## 2022-06-14 NOTE — Telephone Encounter (Signed)
noted 

## 2022-06-24 ENCOUNTER — Ambulatory Visit (AMBULATORY_SURGERY_CENTER): Payer: Self-pay | Admitting: *Deleted

## 2022-06-24 VITALS — Ht 73.0 in | Wt 182.0 lb

## 2022-06-24 DIAGNOSIS — Z86010 Personal history of colonic polyps: Secondary | ICD-10-CM

## 2022-06-24 DIAGNOSIS — Z800 Family history of malignant neoplasm of digestive organs: Secondary | ICD-10-CM

## 2022-06-24 MED ORDER — NA SULFATE-K SULFATE-MG SULF 17.5-3.13-1.6 GM/177ML PO SOLN: 1.0000 | Freq: Once | ORAL | 0 refills | Status: AC

## 2022-06-24 NOTE — Progress Notes (Signed)
Pre visit completed in person.  No egg or soy allergy known to patient  No issues known to pt with past sedation with any surgeries or procedures Patient denies ever being told they had issues or difficulty with intubation  No FH of Malignant Hyperthermia Pt is not on diet pills Pt is not on  home 02  Pt is not on blood thinners  Pt denies issues with constipation  No A fib or A flutter  Pt instructed to use Singlecare.com or GoodRx for a price reduction on prep   

## 2022-06-28 ENCOUNTER — Other Ambulatory Visit: Payer: Self-pay | Admitting: Family Medicine

## 2022-07-01 ENCOUNTER — Encounter: Payer: Self-pay | Admitting: Gastroenterology

## 2022-07-04 ENCOUNTER — Encounter: Payer: Self-pay | Admitting: Family Medicine

## 2022-07-04 DIAGNOSIS — M50323 Other cervical disc degeneration at C6-C7 level: Secondary | ICD-10-CM | POA: Diagnosis not present

## 2022-07-04 DIAGNOSIS — M9903 Segmental and somatic dysfunction of lumbar region: Secondary | ICD-10-CM | POA: Diagnosis not present

## 2022-07-04 DIAGNOSIS — M50321 Other cervical disc degeneration at C4-C5 level: Secondary | ICD-10-CM | POA: Diagnosis not present

## 2022-07-04 DIAGNOSIS — M5137 Other intervertebral disc degeneration, lumbosacral region: Secondary | ICD-10-CM | POA: Diagnosis not present

## 2022-07-06 NOTE — Telephone Encounter (Signed)
He should not worry about it, but let's see we can help. Tell him to decrease the Lisinopril to taking 1/2 a tablet (5 mg) daily and report back in a week or so

## 2022-07-11 ENCOUNTER — Encounter: Payer: Self-pay | Admitting: Cardiovascular Disease

## 2022-07-11 ENCOUNTER — Encounter: Payer: Self-pay | Admitting: Gastroenterology

## 2022-07-11 ENCOUNTER — Ambulatory Visit (AMBULATORY_SURGERY_CENTER): Payer: BC Managed Care – PPO | Admitting: Gastroenterology

## 2022-07-11 ENCOUNTER — Encounter: Payer: Self-pay | Admitting: Family Medicine

## 2022-07-11 VITALS — BP 94/49 | HR 68 | Temp 96.2°F | Resp 10 | Ht 73.0 in | Wt 182.0 lb

## 2022-07-11 DIAGNOSIS — Z09 Encounter for follow-up examination after completed treatment for conditions other than malignant neoplasm: Secondary | ICD-10-CM | POA: Diagnosis not present

## 2022-07-11 DIAGNOSIS — Z86010 Personal history of colonic polyps: Secondary | ICD-10-CM

## 2022-07-11 DIAGNOSIS — D120 Benign neoplasm of cecum: Secondary | ICD-10-CM

## 2022-07-11 DIAGNOSIS — Z1211 Encounter for screening for malignant neoplasm of colon: Secondary | ICD-10-CM | POA: Diagnosis not present

## 2022-07-11 HISTORY — PX: COLONOSCOPY: SHX174

## 2022-07-11 MED ORDER — SODIUM CHLORIDE 0.9 % IV SOLN: 500.0000 mL | Freq: Once | INTRAVENOUS | Status: DC

## 2022-07-11 NOTE — Patient Instructions (Signed)
Handouts Provided:  Polyps  YOU HAD AN ENDOSCOPIC PROCEDURE TODAY AT THE [facility]:   Refer to the procedure report that was given to you for any specific questions about what was found during the examination.  If the procedure report does not answer your questions, please call your gastroenterologist to clarify.  If you requested that your care partner not be given the details of your procedure findings, then the procedure report has been included in a sealed envelope for you to review at your convenience later.  YOU SHOULD EXPECT: Some feelings of bloating in the abdomen. Passage of more gas than usual.  Walking can help get rid of the air that was put into your GI tract during the procedure and reduce the bloating. If you had a lower endoscopy (such as a colonoscopy or flexible sigmoidoscopy) you may notice spotting of blood in your stool or on the toilet paper. If you underwent a bowel prep for your procedure, you may not have a normal bowel movement for a few days.  Please Note:  You might notice some irritation and congestion in your nose or some drainage.  This is from the oxygen used during your procedure.  There is no need for concern and it should clear up in a day or so.  SYMPTOMS TO REPORT IMMEDIATELY:  Following lower endoscopy (colonoscopy or flexible sigmoidoscopy):  Excessive amounts of blood in the stool  Significant tenderness or worsening of abdominal pains  Swelling of the abdomen that is new, acute  Fever of 100F or higher  For urgent or emergent issues, a gastroenterologist can be reached at any hour by calling [phone]. Do not use MyChart messaging for urgent concerns.    DIET:  We do recommend a small meal at first, but then you may proceed to your regular diet.  Drink plenty of fluids but you should avoid alcoholic beverages for 24 hours.  ACTIVITY:  You should plan to take it easy for the rest of today and you should NOT DRIVE or use heavy  machinery until tomorrow (because of the sedation medicines used during the test).    FOLLOW UP: Our staff will call the number listed on your records the next business day following your procedure.  We will call around 7:15- 8:00 am to check on you and address any questions or concerns that you may have regarding the information given to you following your procedure. If we do not reach you, we will leave a message.     If any biopsies were taken you will be contacted by phone or by letter within the next 1-3 weeks.  Please call us at [phone] if you have not heard about the biopsies in 3 weeks.    SIGNATURES/CONFIDENTIALITY: You and/or your care partner have signed paperwork which will be entered into your electronic medical record.  These signatures attest to the fact that that the information above on your After Visit Summary has been reviewed and is understood.  Full responsibility of the confidentiality of this discharge information lies with you and/or your care-partner.  

## 2022-07-11 NOTE — Progress Notes (Deleted)
Pt's states no medical or surgical changes since previsit or office visit. 

## 2022-07-11 NOTE — Progress Notes (Signed)
History and Physical:  This patient presents for endoscopic testing for: Encounter Diagnosis  Name Primary?   Personal history of colonic polyps Yes    76 year old man here for a surveillance colonoscopy. In August 2018, 10 mm cecal tubular adenoma was removed. Previous notes indicate his father had colorectal cancer, but Al tells me today that is not the case.  His father had kidney cancer.  Patient is otherwise without complaints or active issues today.   Past Medical History: Past Medical History:  Diagnosis Date   Arthritis    Cataract    Bil/ right eye surgery   Diabetes mellitus    type II   Diverticulitis of colon    ED (erectile dysfunction)    Gout    no meds   H/O echocardiogram 2013   normal    History of irregular heartbeat    Per pt, heart skips beats at times/had Echo   Hyperlipidemia    Hypertension    Iritis, recurrent     sees Dr. Silvestre Gunner at [facility]    Normal cardiac stress test 09/27/2002   Palpitations    PVCs and PACs per event monitor   Uveitis    sees Dr Polly Cobia at [facility] Dr Manuella Ghazi now     Past Surgical History: Past Surgical History:  Procedure Laterality Date   APPENDECTOMY     CATARACT EXTRACTION     Dr Katy Fitch , left eye/ 2 years ago   CATARACT EXTRACTION  03/16/2017   Right eye   COLONOSCOPY  04/20/2017   per Dr. Loletha Carrow, adenomatous polyps, repeat in 5 yrs    GANGLION CYST EXCISION Right 11/29/2021   Procedure: RIGHT REMOVAL GANGLION OF WRIST;  Surgeon: Sherilyn Cooter, MD;  Location: [place];  Service: Orthopedics;  Laterality: Right;   SHOULDER ARTHROSCOPY     both shoulders    TONSILLECTOMY     TONSILLECTOMY AND ADENOIDECTOMY     tubes in ears     55 years ago    Allergies: Allergies  Allergen Reactions   Contrast Media [Iodinated Contrast Media] Hives and Itching    Hives and itching after IV contrast injection   Red Dye     hives   Fluorescein Other (See Comments)   Ioxaglate Itching and Hives     Hives and itching after IV contrast injection    Outpatient Meds: Current Outpatient Medications  Medication Sig Dispense Refill   allopurinol (ZYLOPRIM) 100 MG tablet TAKE 1 TABLET(100 MG) BY MOUTH DAILY 90 tablet 0   brimonidine (ALPHAGAN) 0.2 % ophthalmic solution Place 1 drop into both eyes as needed. Used 1 or 2 a year     calcium-vitamin D (OSCAL WITH D) 500-200 MG-UNIT per tablet Take 2 tablets by mouth daily.     CONTOUR NEXT TEST test strip TEST BLOOD SUGAR THREE TO FOUR TIMES DAILY 300 strip 0   fluorouracil (EFUDEX) 5 % cream Apply topically.     FOLIC ACID PO Take 10 mg by mouth daily.      glipiZIDE (GLUCOTROL) 10 MG tablet Take 1 tablet (10 mg total) by mouth 2 (two) times daily before a meal. 180 tablet 3   HUMIRA PEN 40 MG/0.4ML PNKT SMARTSIG:40 Milligram(s) SUB-Q Every 2 Weeks     lisinopril (ZESTRIL) 10 MG tablet Take 1 tablet (10 mg total) by mouth daily. 90 tablet 3   meloxicam (MOBIC) 15 MG tablet Take 15 mg by mouth daily.     methotrexate (RHEUMATREX) 2.5 MG  tablet Take 4 tablets by mouth once a week.     Microlet Lancets MISC TEST DAILY 100 each 2   Multiple Vitamin (MULTIVITAMIN) tablet Take 1 tablet by mouth daily.     rosuvastatin (CRESTOR) 20 MG tablet TAKE ONE TABLET BY MOUTH DAILY 90 tablet 3   timolol (TIMOPTIC) 0.5 % ophthalmic solution 1 drop 2 (two) times daily.     traMADol (ULTRAM) 50 MG tablet TAKE 2 TABLETS(100 MG) BY MOUTH EVERY 6 HOURS AS NEEDED FOR SEVERE PAIN 60 tablet 5   zolpidem (AMBIEN) 10 MG tablet TAKE 1 TABLET(10 MG) BY MOUTH AT BEDTIME 90 tablet 1   colchicine 0.6 MG tablet Take 1 tablet (0.6 mg total) by mouth every 6 (six) hours as needed (gout). (Patient not taking: Reported on 07/11/2022) 60 tablet 5   gabapentin (NEURONTIN) 300 MG capsule Take by mouth. (Patient not taking: Reported on 07/11/2022)     Current Facility-Administered Medications  Medication Dose Route Frequency Provider Last Rate Last Admin   0.9 %  sodium chloride  infusion  500 mL Intravenous Continuous Danis, Estill Cotta III, MD       0.9 %  sodium chloride infusion  500 mL Intravenous Once Danis, Estill Cotta III, MD          ___________________________________________________________________ Objective   Exam:  BP (!) 108/59   Pulse 70   Temp (!) 96.2 F (35.7 C) (Skin)   Resp 11   Ht '6\' 1"'$  (1.854 m)   Wt 182 lb (82.6 kg)   SpO2 98%   BMI 24.01 kg/m   CV: regular , S1/S2 Resp: clear to auscultation bilaterally, normal RR and effort noted GI: soft, no tenderness, with active bowel sounds.   Assessment: Encounter Diagnosis  Name Primary?   Personal history of colonic polyps Yes     Plan: Colonoscopy  The benefits and risks of the planned procedure were described in detail with the patient or (when appropriate) their health care proxy.  Risks were outlined as including, but not limited to, bleeding, infection, perforation, adverse medication reaction leading to cardiac or pulmonary decompensation, pancreatitis (if ERCP).  The limitation of incomplete mucosal visualization was also discussed.  No guarantees or warranties were given.    The patient is appropriate for an endoscopic procedure in the ambulatory setting.   - Wilfrid Lund, MD

## 2022-07-11 NOTE — Op Note (Signed)
[facility] Patient Name: Durwood Dittus Procedure Date: 07/11/2022 8:41 AM MRN: [id number] Endoscopist: Mallie Mussel L. Loletha Carrow , MD, [id number] Age: 76 Referring MD:  Date of Birth: [birth year] Gender: Male Account #: [id number] Procedure:                Colonoscopy Indications:              Surveillance: Personal history of adenomatous                            polyps on last colonoscopy 5 years ago                           74m cecal TA Aug 2018 Medicines:                Monitored Anesthesia Care Procedure:                Pre-Anesthesia Assessment:                           - Prior to the procedure, a History and Physical                            was performed, and patient medications and                            allergies were reviewed. The patient's tolerance of                            previous anesthesia was also reviewed. The risks                            and benefits of the procedure and the sedation                            options and risks were discussed with the patient.                            All questions were answered, and informed consent                            was obtained. Prior Anticoagulants: The patient has                            taken no anticoagulant or antiplatelet agents. ASA                            Grade Assessment: II - A patient with mild systemic                            disease. After reviewing the risks and benefits,                            the patient was deemed in satisfactory condition to  undergo the procedure.                           After obtaining informed consent, the colonoscope                            was passed under direct vision. Throughout the                            procedure, the patient's blood pressure, pulse, and                            oxygen saturations were monitored continuously. The                            Olympus CF-HQ190L [id number]) Colonoscope was                             introduced through the anus and advanced to the the                            cecum, identified by appendiceal orifice and                            ileocecal valve. The colonoscopy was performed                            without difficulty. The patient tolerated the                            procedure well. The quality of the bowel                            preparation was excellent. The ileocecal valve,                            appendiceal orifice, and rectum were photographed.                            The bowel preparation used was SUPREP. Scope In: 8:50:35 AM Scope Out: 9:04:22 AM Scope Withdrawal Time: 0 hours 11 minutes 21 seconds  Total Procedure Duration: 0 hours 13 minutes 47 seconds  Findings:                 The perianal and digital rectal examinations were                            normal.                           Repeat examination of right colon under NBI                            performed.  An 8 mm polyp was found in the cecum. The polyp was                            sessile. The polyp was removed with a cold snare.                            Resection and retrieval were complete.                           Multiple diverticula were found in the left colon.                           Internal hemorrhoids were found.                           The exam was otherwise without abnormality on                            direct and retroflexion views. Complications:            No immediate complications. Estimated Blood Loss:     Estimated blood loss was minimal. Impression:               - One 8 mm polyp in the cecum, removed with a cold                            snare. Resected and retrieved.                           - Diverticulosis in the left colon.                           - Internal hemorrhoids.                           - The examination was otherwise normal on direct                            and  retroflexion views. Recommendation:           - Patient has a contact number available for                            emergencies. The signs and symptoms of potential                            delayed complications were discussed with the                            patient. Return to normal activities tomorrow.                            Written discharge instructions were provided to the                            patient.                           -  Resume previous diet.                           - Continue present medications.                           - Await pathology results.                           - No repeat surveillance colonoscopy recommended                            due to age and current guidelines. Margel Joens L. Loletha Carrow, MD 07/11/2022 9:09:10 AM This report has been signed electronically.

## 2022-07-11 NOTE — Progress Notes (Signed)
Pt's states no medical or surgical changes since previsit or office visit. VS assessed by D.T 

## 2022-07-11 NOTE — Progress Notes (Signed)
To pacu, VSS. Report to RN.tb 

## 2022-07-11 NOTE — Progress Notes (Signed)
Called to room to assist during endoscopic procedure.  Patient ID and intended procedure confirmed with present staff. Received instructions for my participation in the procedure from the performing physician.  

## 2022-07-11 NOTE — Telephone Encounter (Signed)
I agree, this was likely the result of some dehydration and the bowel prep. Recheck as needed.

## 2022-07-12 ENCOUNTER — Telehealth: Payer: Self-pay

## 2022-07-12 NOTE — Telephone Encounter (Signed)
Follow up call placed, VM obtained and message left. 

## 2022-07-15 ENCOUNTER — Encounter: Payer: Self-pay | Admitting: Gastroenterology

## 2022-07-20 DIAGNOSIS — H3581 Retinal edema: Secondary | ICD-10-CM | POA: Diagnosis not present

## 2022-07-20 DIAGNOSIS — Z79899 Other long term (current) drug therapy: Secondary | ICD-10-CM | POA: Diagnosis not present

## 2022-07-20 DIAGNOSIS — H30033 Focal chorioretinal inflammation, peripheral, bilateral: Secondary | ICD-10-CM | POA: Diagnosis not present

## 2022-08-03 ENCOUNTER — Other Ambulatory Visit: Payer: Self-pay | Admitting: Family Medicine

## 2022-08-08 DIAGNOSIS — M50321 Other cervical disc degeneration at C4-C5 level: Secondary | ICD-10-CM | POA: Diagnosis not present

## 2022-08-08 DIAGNOSIS — M5137 Other intervertebral disc degeneration, lumbosacral region: Secondary | ICD-10-CM | POA: Diagnosis not present

## 2022-08-08 DIAGNOSIS — M50323 Other cervical disc degeneration at C6-C7 level: Secondary | ICD-10-CM | POA: Diagnosis not present

## 2022-08-08 DIAGNOSIS — M9903 Segmental and somatic dysfunction of lumbar region: Secondary | ICD-10-CM | POA: Diagnosis not present

## 2022-08-09 DIAGNOSIS — H30033 Focal chorioretinal inflammation, peripheral, bilateral: Secondary | ICD-10-CM | POA: Diagnosis not present

## 2022-08-09 DIAGNOSIS — H3581 Retinal edema: Secondary | ICD-10-CM | POA: Diagnosis not present

## 2022-08-09 DIAGNOSIS — E119 Type 2 diabetes mellitus without complications: Secondary | ICD-10-CM | POA: Diagnosis not present

## 2022-08-09 DIAGNOSIS — Z961 Presence of intraocular lens: Secondary | ICD-10-CM | POA: Diagnosis not present

## 2022-08-18 ENCOUNTER — Other Ambulatory Visit: Payer: Self-pay | Admitting: Family Medicine

## 2022-08-24 DIAGNOSIS — H6123 Impacted cerumen, bilateral: Secondary | ICD-10-CM | POA: Diagnosis not present

## 2022-08-24 DIAGNOSIS — Z888 Allergy status to other drugs, medicaments and biological substances status: Secondary | ICD-10-CM | POA: Diagnosis not present

## 2022-08-29 ENCOUNTER — Ambulatory Visit (INDEPENDENT_AMBULATORY_CARE_PROVIDER_SITE_OTHER): Payer: BC Managed Care – PPO | Admitting: Family Medicine

## 2022-08-29 ENCOUNTER — Ambulatory Visit: Payer: BC Managed Care – PPO | Admitting: Family Medicine

## 2022-08-29 ENCOUNTER — Encounter: Payer: Self-pay | Admitting: Family Medicine

## 2022-08-29 VITALS — BP 110/70 | HR 61 | Temp 97.6°F | Wt 179.5 lb

## 2022-08-29 DIAGNOSIS — M50323 Other cervical disc degeneration at C6-C7 level: Secondary | ICD-10-CM | POA: Diagnosis not present

## 2022-08-29 DIAGNOSIS — M50321 Other cervical disc degeneration at C4-C5 level: Secondary | ICD-10-CM | POA: Diagnosis not present

## 2022-08-29 DIAGNOSIS — M9903 Segmental and somatic dysfunction of lumbar region: Secondary | ICD-10-CM | POA: Diagnosis not present

## 2022-08-29 DIAGNOSIS — M5137 Other intervertebral disc degeneration, lumbosacral region: Secondary | ICD-10-CM | POA: Diagnosis not present

## 2022-08-29 DIAGNOSIS — E119 Type 2 diabetes mellitus without complications: Secondary | ICD-10-CM | POA: Diagnosis not present

## 2022-08-29 LAB — POCT GLYCOSYLATED HEMOGLOBIN (HGB A1C): Hemoglobin A1C: 7.3 % — AB (ref 4.0–5.6)

## 2022-08-29 MED ORDER — PIOGLITAZONE HCL 30 MG PO TABS: 30.0000 mg | ORAL_TABLET | Freq: Every day | ORAL | 3 refills | Status: DC

## 2022-08-29 NOTE — Progress Notes (Signed)
   Subjective:    Patient ID: [name] [name]., male    DOB: [birth year], 76 y.o.   MRN: [id number]  HPI Here to follow up on diabetes. He feels well. He has been watching his diet carefully. His last A1c here in September was 8.1%. Today this is 7.3 %.    Review of Systems  Constitutional: Negative.   Respiratory: Negative.    Cardiovascular: Negative.        Objective:   Physical Exam Constitutional:      Appearance: Normal appearance.  Cardiovascular:     Rate and Rhythm: Normal rate and regular rhythm.     Pulses: Normal pulses.     Heart sounds: Normal heart sounds.  Pulmonary:     Effort: Pulmonary effort is normal.     Breath sounds: Normal breath sounds.  Neurological:     Mental Status: He is alert.           Assessment & Plan:  Type 2 diabetes. He will stay on Glipizide 10 mg BID and we will add Pioglitazone 30 mg daily. Recheck in 90 days.  Alysia Penna, MD

## 2022-09-01 ENCOUNTER — Other Ambulatory Visit: Payer: Self-pay | Admitting: Family Medicine

## 2022-09-02 NOTE — Telephone Encounter (Signed)
Last OV-08/29/22 Last refill-02/15/22-90 tabs, 1 refill  Next OV-11/23/22

## 2022-09-03 ENCOUNTER — Other Ambulatory Visit: Payer: Self-pay | Admitting: Family Medicine

## 2022-09-18 ENCOUNTER — Other Ambulatory Visit: Payer: Self-pay | Admitting: Family Medicine

## 2022-09-28 ENCOUNTER — Ambulatory Visit (INDEPENDENT_AMBULATORY_CARE_PROVIDER_SITE_OTHER): Payer: BC Managed Care – PPO | Admitting: Family Medicine

## 2022-09-28 ENCOUNTER — Encounter: Payer: Self-pay | Admitting: Family Medicine

## 2022-09-28 VITALS — BP 128/82 | HR 58 | Temp 97.5°F | Wt 181.4 lb

## 2022-09-28 DIAGNOSIS — M9901 Segmental and somatic dysfunction of cervical region: Secondary | ICD-10-CM | POA: Diagnosis not present

## 2022-09-28 DIAGNOSIS — M50323 Other cervical disc degeneration at C6-C7 level: Secondary | ICD-10-CM | POA: Diagnosis not present

## 2022-09-28 DIAGNOSIS — M25512 Pain in left shoulder: Secondary | ICD-10-CM

## 2022-09-28 DIAGNOSIS — M50321 Other cervical disc degeneration at C4-C5 level: Secondary | ICD-10-CM | POA: Diagnosis not present

## 2022-09-28 DIAGNOSIS — M50322 Other cervical disc degeneration at C5-C6 level: Secondary | ICD-10-CM | POA: Diagnosis not present

## 2022-09-28 MED ORDER — METHYLPREDNISOLONE 4 MG PO TBPK: ORAL_TABLET | ORAL | 0 refills | Status: DC

## 2022-09-28 NOTE — Progress Notes (Signed)
   Subjective:    Patient ID: [name] [name]., male    DOB: 03-21-1946, 77 y.o.   MRN: [id number]  HPI Here for one week of pain in the left shoulder, especially when he tries to lift it above his head. No recent trauma, but this started the morning after he worked out with his trainer. He gets some relief with Lidocaine patches.    Review of Systems  Constitutional: Negative.   Respiratory: Negative.    Cardiovascular: Negative.   Musculoskeletal:  Positive for arthralgias.       Objective:   Physical Exam Constitutional:      General: He is not in acute distress.    Appearance: Normal appearance.  Cardiovascular:     Rate and Rhythm: Normal rate and regular rhythm.     Pulses: Normal pulses.     Heart sounds: Normal heart sounds.  Pulmonary:     Effort: Pulmonary effort is normal.     Breath sounds: Normal breath sounds.  Musculoskeletal:     Comments: The left shoulder is tender in the anterior subacromial area. He has full ROM but extending the arm above horizontal is very painful   Neurological:     Mental Status: He is alert.           Assessment & Plan:  Left shoulder pain, likely due to rotator cuff tendonitis. He will rest the shoulder and take a Medrol dose pack. Recheck as needed.  Alysia Penna, MD

## 2022-10-20 DIAGNOSIS — M9901 Segmental and somatic dysfunction of cervical region: Secondary | ICD-10-CM | POA: Diagnosis not present

## 2022-10-20 DIAGNOSIS — M50323 Other cervical disc degeneration at C6-C7 level: Secondary | ICD-10-CM | POA: Diagnosis not present

## 2022-10-20 DIAGNOSIS — M50321 Other cervical disc degeneration at C4-C5 level: Secondary | ICD-10-CM | POA: Diagnosis not present

## 2022-10-20 DIAGNOSIS — M50322 Other cervical disc degeneration at C5-C6 level: Secondary | ICD-10-CM | POA: Diagnosis not present

## 2022-10-25 ENCOUNTER — Encounter: Payer: Self-pay | Admitting: Family Medicine

## 2022-10-25 ENCOUNTER — Other Ambulatory Visit: Payer: Self-pay

## 2022-10-25 ENCOUNTER — Emergency Department (HOSPITAL_BASED_OUTPATIENT_CLINIC_OR_DEPARTMENT_OTHER)
Admission: EM | Admit: 2022-10-25 | Discharge: 2022-10-25 | Disposition: A | Discharge status: Home / Self Care | Payer: BC Managed Care – PPO | Attending: Emergency Medicine | Admitting: Emergency Medicine

## 2022-10-25 ENCOUNTER — Ambulatory Visit (INDEPENDENT_AMBULATORY_CARE_PROVIDER_SITE_OTHER): Payer: BC Managed Care – PPO | Admitting: Family Medicine

## 2022-10-25 ENCOUNTER — Encounter (HOSPITAL_BASED_OUTPATIENT_CLINIC_OR_DEPARTMENT_OTHER): Payer: Self-pay

## 2022-10-25 ENCOUNTER — Emergency Department (HOSPITAL_BASED_OUTPATIENT_CLINIC_OR_DEPARTMENT_OTHER): Payer: BC Managed Care – PPO | Admitting: Radiology

## 2022-10-25 VITALS — BP 130/68 | HR 33 | Temp 98.1°F | Wt 181.0 lb

## 2022-10-25 DIAGNOSIS — I129 Hypertensive chronic kidney disease with stage 1 through stage 4 chronic kidney disease, or unspecified chronic kidney disease: Secondary | ICD-10-CM | POA: Insufficient documentation

## 2022-10-25 DIAGNOSIS — M545 Low back pain, unspecified: Secondary | ICD-10-CM | POA: Diagnosis not present

## 2022-10-25 DIAGNOSIS — Z7984 Long term (current) use of oral hypoglycemic drugs: Secondary | ICD-10-CM | POA: Insufficient documentation

## 2022-10-25 DIAGNOSIS — E1122 Type 2 diabetes mellitus with diabetic chronic kidney disease: Secondary | ICD-10-CM | POA: Insufficient documentation

## 2022-10-25 DIAGNOSIS — M47816 Spondylosis without myelopathy or radiculopathy, lumbar region: Secondary | ICD-10-CM | POA: Diagnosis not present

## 2022-10-25 DIAGNOSIS — Z8616 Personal history of COVID-19: Secondary | ICD-10-CM | POA: Diagnosis not present

## 2022-10-25 DIAGNOSIS — M5442 Lumbago with sciatica, left side: Secondary | ICD-10-CM | POA: Diagnosis not present

## 2022-10-25 DIAGNOSIS — Z79899 Other long term (current) drug therapy: Secondary | ICD-10-CM | POA: Diagnosis not present

## 2022-10-25 DIAGNOSIS — M5432 Sciatica, left side: Secondary | ICD-10-CM

## 2022-10-25 DIAGNOSIS — N183 Chronic kidney disease, stage 3 unspecified: Secondary | ICD-10-CM | POA: Diagnosis not present

## 2022-10-25 MED ORDER — METHOCARBAMOL 500 MG PO TABS: 500.0000 mg | ORAL_TABLET | Freq: Two times a day (BID) | ORAL | 0 refills | Status: DC

## 2022-10-25 MED ORDER — METHYLPREDNISOLONE 4 MG PO TBPK: ORAL_TABLET | ORAL | 0 refills | Status: DC

## 2022-10-25 MED ORDER — HYDROMORPHONE HCL 1 MG/ML IJ SOLN
1.0000 mg | Freq: Once | INTRAMUSCULAR | Status: AC
  Administered 2022-10-25: 1 mg via INTRAMUSCULAR
  Filled 2022-10-25: qty 1

## 2022-10-25 NOTE — ED Provider Notes (Signed)
[facility] Provider Note  CSN: [id number] Arrival date & time: 10/25/22 0040  Chief Complaint(s) Back Pain  HPI [name] [name]. is a 77 y.o. male with a past medical history listed below including type 2 diabetes, degenerative lumbar disc disease here for lower back pain located about the left SI joint that began yesterday and gradually worsened throughout the day.  Worse with movement and range of motion.  He denies any falls or trauma.  Reports pain radiating down the left leg with some mild paresthesias.  No weakness.  No saddle anesthesia, bladder/bowel incontinence.  No recent fevers or infections.  No night sweats. No abdominal pain.  No urinary symptoms.  No other physical complaints. He reports having steroid injections in his back several years ago but no recent instrumentation.     Back Pain   Past Medical History Past Medical History:  Diagnosis Date   Arthritis    Cataract    Bil/ right eye surgery   Diabetes mellitus    type II   Diverticulitis of colon    ED (erectile dysfunction)    Gout    no meds   H/O echocardiogram 2013   normal    History of irregular heartbeat    Per pt, heart skips beats at times/had Echo   Hyperlipidemia    Hypertension    Iritis, recurrent     sees Dr. Silvestre Gunner at [facility]    Normal cardiac stress test 09/27/2002   Palpitations    PVCs and PACs per event monitor   Uveitis    sees Dr Polly Cobia at [facility] Dr Manuella Ghazi now   Patient Active Problem List   Diagnosis Date Noted   CKD (chronic kidney disease) stage 3, GFR 30-59 ml/min ([place]) 12/24/2021   Ganglion of right wrist 07/30/2021   OA (osteoarthritis) 06/17/2021   COVID-19 virus infection 02/17/2021   Neuropathy 12/09/2020   Uveitis of both eyes 05/21/2020   Type 2 diabetes mellitus without complication, without long-term current use of insulin ([place]) 07/26/2017   Palpitations 02/07/2012   OTHER TENOSYNOVITIS OF HAND AND WRIST  06/01/2008   Dyslipidemia 03/05/2007   GOUT 03/05/2007   Essential hypertension 03/05/2007   DIVERTICULOSIS, COLON 03/05/2007   COLONIC POLYPS, BENIGN, HX OF 03/05/2007   BPH with urinary obstruction 03/05/2007   Home Medication(s) Prior to Admission medications   Medication Sig Start Date End Date Taking? Authorizing Provider  methocarbamol (ROBAXIN) 500 MG tablet Take 1-2 tablets (500-1,000 mg total) by mouth 2 (two) times daily. 10/25/22  Yes Montrail Mehrer, Grayce Sessions, MD  allopurinol (ZYLOPRIM) 100 MG tablet TAKE 1 TABLET(100 MG) BY MOUTH DAILY 09/19/22   Laurey Morale, MD  brimonidine (ALPHAGAN) 0.2 % ophthalmic solution Place 1 drop into both eyes as needed. Used 1 or 2 a year 08/13/19   [provider]  calcium-vitamin D (OSCAL WITH D) 500-200 MG-UNIT per tablet Take 2 tablets by mouth daily.    [provider]  colchicine 0.6 MG tablet Take 1 tablet (0.6 mg total) by mouth every 6 (six) hours as needed (gout). 06/17/21   Laurey Morale, MD  CONTOUR NEXT TEST test strip TEST BLOOD SUGAR THREE TO FOUR TIMES DAILY 08/11/21   Laurey Morale, MD  fluorouracil (EFUDEX) 5 % cream Apply topically. 06/21/22   [provider]  FOLIC ACID PO Take 10 mg by mouth daily.     [provider]  gabapentin (NEURONTIN) 300 MG capsule TAKE  1 CAPSULE(300 MG) BY MOUTH TWICE DAILY 08/08/22   Laurey Morale, MD  glipiZIDE (GLUCOTROL) 10 MG tablet Take 1 tablet (10 mg total) by mouth 2 (two) times daily before a meal. 05/31/22   Laurey Morale, MD  HUMIRA PEN 40 MG/0.4ML PNKT SMARTSIG:40 Milligram(s) SUB-Q Every 2 Weeks 06/06/22   [provider]  lisinopril (ZESTRIL) 10 MG tablet Take 1 tablet (10 mg total) by mouth daily. 05/30/22   Laurey Morale, MD  meloxicam (MOBIC) 15 MG tablet TAKE 1 TABLET(15 MG) BY MOUTH DAILY Patient taking differently: as needed. 08/19/22   Laurey Morale, MD  methotrexate (RHEUMATREX) 2.5 MG tablet Take 4 tablets by mouth once a week. 01/07/20    [provider]  methylPREDNISolone (MEDROL DOSEPAK) 4 MG TBPK tablet As directed 09/28/22   Laurey Morale, MD  [place] TEST DAILY 02/15/22   Laurey Morale, MD  Multiple Vitamin (MULTIVITAMIN) tablet Take 1 tablet by mouth daily.    [provider]  pioglitazone (ACTOS) 30 MG tablet Take 1 tablet (30 mg total) by mouth daily. 08/29/22   Laurey Morale, MD  rosuvastatin (CRESTOR) 20 MG tablet TAKE 1 TABLET BY MOUTH DAILY 09/05/22   Laurey Morale, MD  timolol (TIMOPTIC) 0.5 % ophthalmic solution 1 drop 2 (two) times daily. 06/05/22   [provider]  traMADol (ULTRAM) 50 MG tablet TAKE 2 TABLETS(100 MG) BY MOUTH EVERY 6 HOURS AS NEEDED FOR SEVERE PAIN 05/18/22   Laurey Morale, MD  zolpidem (AMBIEN) 10 MG tablet TAKE 1 TABLET(10 MG) BY MOUTH AT BEDTIME 09/02/22   Laurey Morale, MD                                                                                                                                    Allergies Contrast media [iodinated contrast media], Red dye, Fluorescein, and Ioxaglate  Review of Systems Review of Systems  Musculoskeletal:  Positive for back pain.   As noted in HPI  Physical Exam Vital Signs  I have reviewed the triage vital signs BP (!) 159/92   Pulse 85   Temp 97.9 F (36.6 C) (Oral)   Resp 18   Ht 6' 1"$  (1.854 m)   Wt 81.6 kg   SpO2 97%   BMI 23.75 kg/m   Physical Exam Vitals reviewed.  Constitutional:      General: He is not in acute distress.    Appearance: He is well-developed. He is not diaphoretic.  HENT:     Head: Normocephalic and atraumatic.     Right Ear: External ear normal.     Left Ear: External ear normal.     Nose: Nose normal.     Mouth/Throat:     Mouth: Mucous membranes are moist.  Eyes:     General: No scleral icterus.    Conjunctiva/sclera: Conjunctivae normal.  Neck:  Trachea: Phonation normal.  Cardiovascular:     Rate and Rhythm: Normal rate and regular rhythm.  Pulmonary:      Effort: Pulmonary effort is normal. No respiratory distress.     Breath sounds: No stridor.  Abdominal:     General: There is no distension.     Palpations: There is no pulsatile mass.     Tenderness: There is no abdominal tenderness. There is no guarding or rebound.  Musculoskeletal:        General: Normal range of motion.     Cervical back: Normal range of motion.     Thoracic back: No spasms or bony tenderness.     Lumbar back: Tenderness present. No spasms or bony tenderness.       Back:     Comments: +straight leg raise with left leg  Neurological:     Mental Status: He is alert and oriented to person, place, and time.     Comments: Spine Exam: Strength: 5/5 throughout LE bilaterally  Sensation: Intact to light touch in proximal and distal LE bilaterally Reflexes: no clonus   Psychiatric:        Behavior: Behavior normal.     ED Results and Treatments Labs (all labs ordered are listed, but only abnormal results are displayed) Labs Reviewed - No data to display                                                                                                                       EKG  EKG Interpretation  Date/Time:    Ventricular Rate:    PR Interval:    QRS Duration:   QT Interval:    QTC Calculation:   R Axis:     Text Interpretation:         Radiology DG Lumbar Spine 2-3 Views  Result Date: 10/25/2022 CLINICAL DATA:  Low back pain radiating down left leg. EXAM: LUMBAR SPINE - 2-3 VIEW COMPARISON:  05/07/2014. FINDINGS: There is no evidence of lumbar spine fracture. There is anterolisthesis at L4-L5 and mild retrolisthesis at L2-L3. Multilevel intervertebral disc space narrowing, degenerative endplate changes, and facet arthropathy. Atherosclerotic calcification of the aorta is noted. IMPRESSION: 1. No acute fracture. 2. Multilevel degenerative changes. Electronically Signed   By: Brett Fairy M.D.   On: 10/25/2022 02:34    Medications Ordered in  ED Medications  HYDROmorphone (DILAUDID) injection 1 mg (1 mg Intramuscular Given 10/25/22 0251)  Procedures Procedures  (including critical care time)  Medical Decision Making / ED Course   Medical Decision Making Amount and/or Complexity of Data Reviewed Radiology: ordered and independent interpretation performed. Decision-making details documented in ED Course.  Risk Prescription drug management.    This patient presents to the ED for concern of lower back pain, this involves an extensive number of treatment options, and is a complaint that carries with it a high risk of complications and morbidity. The differential diagnosis includes but not limited to herniated this/radiculopathy, muscle strain/spasm, SI joint arthritis.  Given exam and presentation I have low concern for AAA, discitis, epidural abscess, cauda equina, that would require advanced imaging.  Given age, will obtain plain film to rule out occult fracture or bony lesion concerning for neoplastic process.  X-ray negative for any acute fracture.  Patient does have degenerative disc disease and mild antero/retrolisthesis.  Provided with IM Dilaudid. Patient already has prescription for tramadol and meloxicam which she takes daily.  Will prescribe muscle relaxers.  Recommend additional supportive management. Patient already has establish spine surgeon.  Recommended close follow-up.      Final Clinical Impression(s) / ED Diagnoses Final diagnoses:  Acute left-sided low back pain with left-sided sciatica   The patient appears reasonably screened and/or stabilized for discharge and I doubt any other medical condition or other [facility] requiring further screening, evaluation, or treatment in the ED at this time. I have discussed the findings, Dx and Tx plan with the patient/family who expressed  understanding and agree(s) with the plan. Discharge instructions discussed at length. The patient/family was given strict return precautions who verbalized understanding of the instructions. No further questions at time of discharge.  Disposition: Discharge  Condition: Good  ED Discharge Orders          Ordered    methocarbamol (ROBAXIN) 500 MG tablet  2 times daily        10/25/22 0206             Follow Up: Laurey Morale, MD [place] [id number] [phone]  Call  to schedule an appointment for close follow up  [place], [place] [address] [place] [address] [phone]  Call  to schedule an appointment for close follow up           This chart was dictated using voice recognition software.  Despite best efforts to proofread,  errors can occur which can change the documentation meaning.    Fatima Blank, MD 10/25/22 [phone]

## 2022-10-25 NOTE — Discharge Instructions (Addendum)
In addition to your tramadol and meloxicam, you may use over-the-counter Acetaminophen (Tylenol), topical muscle creams such as SalonPas, [employer], Bengay, etc. Please stretch, apply ice or heat (whichever helps), and have massage therapy for additional assistance.

## 2022-10-25 NOTE — Progress Notes (Signed)
   Subjective:    Patient ID: [name] Emerald., male    DOB: 09/03/1946, 77 y.o.   MRN: [id number]  HPI Here to follow up on an ED visit last night. He presented with acute severe pain in the left lower back that radiated down the left leg. The leg is somewhat numb as well. No recent trauma. He has a hx of degenerative discs in the lumbar spine, as evidenced by an MRI in 2015. He has had epidural steroid injections in the past. At the ED a Xray again showed DJD with no fractures. He was given a shot of Dilaudid and Methocarbamol. He has also been taking Tramadol which he has at home. Today the pain is not much better.    Review of Systems  Constitutional: Negative.   Respiratory: Negative.    Cardiovascular: Negative.   Gastrointestinal: Negative.   Genitourinary: Negative.   Musculoskeletal:  Positive for back pain.       Objective:   Physical Exam Constitutional:      Comments: In a wheelchair, in pain   Cardiovascular:     Rate and Rhythm: Normal rate and regular rhythm.     Pulses: Normal pulses.     Heart sounds: Normal heart sounds.  Pulmonary:     Effort: Pulmonary effort is normal.     Breath sounds: Normal breath sounds.  Musculoskeletal:     Comments: Tender in the left lower back and over the left sciatic notch. SLR is positive on the left   Neurological:     Mental Status: He is alert.           Assessment & Plan:  Left sided sciatica. He will continue with Methocarbamol and Tramadol as needed. We will add a Medrol dose pack. Refer back to Neurosurgery. We spent a total of ( 32  ) minutes reviewing records and discussing these issues.   Alysia Penna, MD

## 2022-10-25 NOTE — ED Notes (Signed)
Patient to xray via stretcher

## 2022-10-25 NOTE — ED Triage Notes (Signed)
Pt presents with complaint of lower back pain radiating down left leg beginning yesterday and progressively getting worse. Pt reports having taken a muscle relaxer 3 hours prior. Reports difficulty with back in higher disc spaces but never this low. Pt denies trauma, denies urinary difficulty, denies BM for 2 days.

## 2022-10-26 ENCOUNTER — Encounter: Payer: Self-pay | Admitting: Family Medicine

## 2022-10-27 ENCOUNTER — Telehealth: Payer: Self-pay | Admitting: Family Medicine

## 2022-10-27 ENCOUNTER — Other Ambulatory Visit: Payer: Self-pay

## 2022-10-27 ENCOUNTER — Observation Stay (HOSPITAL_BASED_OUTPATIENT_CLINIC_OR_DEPARTMENT_OTHER)
Admission: EM | Admit: 2022-10-27 | Discharge: 2022-10-28 | Disposition: A | Discharge status: Home / Self Care | Payer: BC Managed Care – PPO | Attending: Internal Medicine | Admitting: Internal Medicine

## 2022-10-27 ENCOUNTER — Emergency Department (HOSPITAL_BASED_OUTPATIENT_CLINIC_OR_DEPARTMENT_OTHER): Payer: BC Managed Care – PPO

## 2022-10-27 ENCOUNTER — Encounter (HOSPITAL_BASED_OUTPATIENT_CLINIC_OR_DEPARTMENT_OTHER): Payer: Self-pay

## 2022-10-27 DIAGNOSIS — R29818 Other symptoms and signs involving the nervous system: Secondary | ICD-10-CM | POA: Diagnosis not present

## 2022-10-27 DIAGNOSIS — R42 Dizziness and giddiness: Secondary | ICD-10-CM | POA: Diagnosis not present

## 2022-10-27 DIAGNOSIS — M5432 Sciatica, left side: Secondary | ICD-10-CM | POA: Insufficient documentation

## 2022-10-27 DIAGNOSIS — I129 Hypertensive chronic kidney disease with stage 1 through stage 4 chronic kidney disease, or unspecified chronic kidney disease: Secondary | ICD-10-CM | POA: Insufficient documentation

## 2022-10-27 DIAGNOSIS — M25552 Pain in left hip: Secondary | ICD-10-CM | POA: Diagnosis not present

## 2022-10-27 DIAGNOSIS — W19XXXA Unspecified fall, initial encounter: Secondary | ICD-10-CM | POA: Diagnosis not present

## 2022-10-27 DIAGNOSIS — Z79621 Long term (current) use of calcineurin inhibitor: Secondary | ICD-10-CM | POA: Insufficient documentation

## 2022-10-27 DIAGNOSIS — E1165 Type 2 diabetes mellitus with hyperglycemia: Secondary | ICD-10-CM | POA: Insufficient documentation

## 2022-10-27 DIAGNOSIS — Z87891 Personal history of nicotine dependence: Secondary | ICD-10-CM | POA: Insufficient documentation

## 2022-10-27 DIAGNOSIS — N1831 Chronic kidney disease, stage 3a: Secondary | ICD-10-CM | POA: Insufficient documentation

## 2022-10-27 DIAGNOSIS — E1122 Type 2 diabetes mellitus with diabetic chronic kidney disease: Secondary | ICD-10-CM | POA: Insufficient documentation

## 2022-10-27 DIAGNOSIS — R2990 Unspecified symptoms and signs involving the nervous system: Secondary | ICD-10-CM | POA: Diagnosis present

## 2022-10-27 DIAGNOSIS — R531 Weakness: Secondary | ICD-10-CM

## 2022-10-27 DIAGNOSIS — R296 Repeated falls: Secondary | ICD-10-CM | POA: Diagnosis not present

## 2022-10-27 LAB — CBC
HCT: 43.2 % (ref 39.0–52.0)
Hemoglobin: 14.9 g/dL (ref 13.0–17.0)
MCH: 32.5 pg (ref 26.0–34.0)
MCHC: 34.5 g/dL (ref 30.0–36.0)
MCV: 94.3 fL (ref 80.0–100.0)
Platelets: 198 10*3/uL (ref 150–400)
RBC: 4.58 MIL/uL (ref 4.22–5.81)
RDW: 13.6 % (ref 11.5–15.5)
WBC: 13.3 10*3/uL — ABNORMAL HIGH (ref 4.0–10.5)
nRBC: 0 % (ref 0.0–0.2)

## 2022-10-27 LAB — BASIC METABOLIC PANEL
Anion gap: 10 (ref 5–15)
BUN: 47 mg/dL — ABNORMAL HIGH (ref 8–23)
CO2: 24 mmol/L (ref 22–32)
Calcium: 9.9 mg/dL (ref 8.9–10.3)
Chloride: 98 mmol/L (ref 98–111)
Creatinine, Ser: 1.67 mg/dL — ABNORMAL HIGH (ref 0.61–1.24)
GFR, Estimated: 42 mL/min — ABNORMAL LOW (ref >60)
Glucose, Bld: 236 mg/dL — ABNORMAL HIGH (ref 70–99)
Potassium: 4.3 mmol/L (ref 3.5–5.1)
Sodium: 132 mmol/L — ABNORMAL LOW (ref 135–145)

## 2022-10-27 MED ORDER — LACTATED RINGERS IV BOLUS
1000.0000 mL | Freq: Once | INTRAVENOUS | Status: AC
  Administered 2022-10-27: 1000 mL via INTRAVENOUS

## 2022-10-27 NOTE — ED Provider Notes (Signed)
[name] [name]   CSN: [id number] Arrival date & time: 10/27/22  1858     History  Chief Complaint  Patient presents with   [name] [name]. is a 77 y.o. male.  77 year old male with a history of diabetes, degenerative disc disease, and left-sided sciatica who presents emergency department after fall.  Patient typically walks without any assistance.  Says that he fell 3 times today.  Says that his left leg gave out beneath him.  Says the first time he was trying to move a scale to his foot and fell down.  The second time he was turning to his left and his leg gave way and he struck his left hip.  Third time he reports he was going into his car and turned to his left and fell into the bushes near his car.  Was seen several days ago in the emergency department for left-sided sciatic pain.  Says that since then his back pain has resolved.  Was given methocarbamol by his primary doctor with last dose yesterday.  Says that he occasionally feels dizzy but did not feel dizzy just before falling down.  Does have left hip pain as a result of the second fall but says that his leg appeared to be weak before that.  Not on any blood thinners.  No history of stroke. Says when he went to bed his leg felt normal but when he awoke it felt heavy.        Home Medications Prior to Admission medications   Medication Sig Start Date End Date Taking? Authorizing Provider  allopurinol (ZYLOPRIM) 100 MG tablet TAKE 1 TABLET(100 MG) BY MOUTH DAILY 09/19/22   Laurey Morale, MD  brimonidine (ALPHAGAN) 0.2 % ophthalmic solution Place 1 drop into both eyes as needed. Used 1 or 2 a year 08/13/19   [provider]  calcium-vitamin D (OSCAL WITH D) 500-200 MG-UNIT per tablet Take 2 tablets by mouth daily.    [provider]  colchicine 0.6 MG tablet Take 1 tablet (0.6 mg total) by mouth every 6 (six) hours as needed (gout). 06/17/21   Laurey Morale, MD  CONTOUR NEXT TEST test strip TEST BLOOD SUGAR THREE TO FOUR TIMES DAILY 08/11/21   Laurey Morale, MD  fluorouracil (EFUDEX) 5 % cream Apply topically. 06/21/22   [provider]  FOLIC ACID PO Take 10 mg by mouth daily.     [provider]  gabapentin (NEURONTIN) 300 MG capsule TAKE 1 CAPSULE(300 MG) BY MOUTH TWICE DAILY 08/08/22   Laurey Morale, MD  glipiZIDE (GLUCOTROL) 10 MG tablet Take 1 tablet (10 mg total) by mouth 2 (two) times daily before a meal. 05/31/22   Laurey Morale, MD  HUMIRA PEN 40 MG/0.4ML PNKT SMARTSIG:40 Milligram(s) SUB-Q Every 2 Weeks 06/06/22   [provider]  lisinopril (ZESTRIL) 10 MG tablet Take 1 tablet (10 mg total) by mouth daily. 05/30/22   Laurey Morale, MD  meloxicam (MOBIC) 15 MG tablet TAKE 1 TABLET(15 MG) BY MOUTH DAILY Patient taking differently: as needed. 08/19/22   Laurey Morale, MD  methocarbamol (ROBAXIN) 500 MG tablet Take 1-2 tablets (500-1,000 mg total) by mouth 2 (two) times daily. 10/25/22   Fatima Blank, MD  methotrexate (RHEUMATREX) 2.5 MG tablet Take 4 tablets by mouth once a week. 01/07/20   [provider]  methylPREDNISolone (MEDROL DOSEPAK) 4 MG TBPK tablet As  directed 10/25/22   Laurey Morale, MD  [place] TEST DAILY 02/15/22   Laurey Morale, MD  Multiple Vitamin (MULTIVITAMIN) tablet Take 1 tablet by mouth daily.    [provider]  pioglitazone (ACTOS) 30 MG tablet Take 1 tablet (30 mg total) by mouth daily. 08/29/22   Laurey Morale, MD  rosuvastatin (CRESTOR) 20 MG tablet TAKE 1 TABLET BY MOUTH DAILY 09/05/22   Laurey Morale, MD  timolol (TIMOPTIC) 0.5 % ophthalmic solution 1 drop 2 (two) times daily. 06/05/22   [provider]  traMADol (ULTRAM) 50 MG tablet TAKE 2 TABLETS(100 MG) BY MOUTH EVERY 6 HOURS AS NEEDED FOR SEVERE PAIN 05/18/22   Laurey Morale, MD  zolpidem (AMBIEN) 10 MG tablet TAKE 1 TABLET(10 MG) BY MOUTH AT BEDTIME 09/02/22   Laurey Morale, MD      Allergies    Contrast media [iodinated contrast media], Red dye, Fluorescein, and Ioxaglate    Review of Systems   Review of Systems  Physical Exam Updated Vital Signs BP 131/76 (BP Location: Right Arm)   Pulse 78   Temp 97.9 F (36.6 C) (Oral)   Resp 15   Ht 6' 1"$  (1.854 m)   Wt 82.1 kg   SpO2 97%   BMI 23.88 kg/m  Physical Exam Constitutional:      General: He is not in acute distress.    Appearance: Normal appearance. He is not ill-appearing.  HENT:     Head: Normocephalic.     Comments: Abrasion right side of the face    Right Ear: External ear normal.     Left Ear: External ear normal.     Mouth/Throat:     Mouth: Mucous membranes are moist.     Pharynx: Oropharynx is clear.  Eyes:     Extraocular Movements: Extraocular movements intact.     Conjunctiva/sclera: Conjunctivae normal.     Pupils: Pupils are equal, round, and reactive to light.  Neck:     Comments: No C-spine midline tenderness to palpation Cardiovascular:     Rate and Rhythm: Normal rate and regular rhythm.     Pulses: Normal pulses.     Heart sounds: Normal heart sounds.  Pulmonary:     Effort: Pulmonary effort is normal. No respiratory distress.     Breath sounds: Normal breath sounds.  Abdominal:     General: Abdomen is flat. Bowel sounds are normal. There is no distension.     Palpations: Abdomen is soft. There is no mass.     Tenderness: There is no abdominal tenderness. There is no guarding.  Musculoskeletal:        General: No deformity. Normal range of motion.     Cervical back: No rigidity or tenderness.     Comments: No tenderness to palpation of midline thoracic or lumbar spine.  No step-offs palpated.  No tenderness to palpation of chest wall.  No bruising noted.  No tenderness to palpation of bilateral clavicles.  No tenderness to palpation, bruising, or deformities noted of bilateral shoulders, elbows, wrists, knees, or ankles.  Tenderness palpation of left  hip.  Neurological:     Mental Status: He is alert.     Comments: MENTAL STATUS: AAOx3 CRANIAL NERVES: II: Pupils equal and reactive 3 mm mm BL, no RAPD, no VF deficits III, IV, VI: EOM intact, no gaze preference or deviation, no nystagmus. V: normal sensation to light touch in V1, V2, and V3 segments bilaterally VII:  no facial weakness or asymmetry, no nasolabial fold flattening VIII: normal hearing to speech and finger friction IX, X: normal palatal elevation, no uvular deviation XI: 5/5 head turn and 5/5 shoulder shrug bilaterally XII: midline tongue protrusion MOTOR: 5/5 strength in R shoulder flexion, elbow flexion and extension, and grip strength. 5/5 strength in L shoulder flexion, elbow flexion and extension, and grip strength.  5/5 strength in R hip and knee flexion, knee extension, ankle plantar and dorsiflexion.  4/5 strength in left lower extremity SENSORY: Normal sensation to light touch in all extremities COORD: Normal finger to nose and heel to shin, no tremor, no dysmetria STATION: normal stance, no truncal ataxia GAIT: Leans to left with ambulation      ED Results / Procedures / Treatments   Labs (all labs ordered are listed, but only abnormal results are displayed) Labs Reviewed  CBC - Abnormal; Notable for the following components:      Result Value   WBC 13.3 (*)    All other components within normal limits  BASIC METABOLIC PANEL - Abnormal; Notable for the following components:   Sodium 132 (*)    Glucose, Bld 236 (*)    BUN 47 (*)    Creatinine, Ser 1.67 (*)    GFR, Estimated 42 (*)    All other components within normal limits    EKG EKG Interpretation  Date/Time:  Thursday October 27 2022 19:11:36 EST Ventricular Rate:  97 PR Interval:  202 QRS Duration: 76 QT Interval:  326 QTC Calculation: 414 R Axis:   44 Text Interpretation: Sinus rhythm with frequent and consecutive Premature ventricular complexes Cannot rule out Anterior infarct , age  undetermined Abnormal ECG No previous ECGs available Confirmed by Margaretmary Eddy [phone]) on 10/27/2022 10:15:56 PM  Radiology DG Hip Unilat W or Wo Pelvis 2-3 Views Left  Result Date: 10/27/2022 CLINICAL DATA:  Left hip pain EXAM: DG HIP (WITH OR WITHOUT PELVIS) 2V LEFT COMPARISON:  None Available. FINDINGS: There is no evidence of hip fracture or dislocation. There is no evidence of arthropathy or other focal bone abnormality. IMPRESSION: Negative. Electronically Signed   By: Sammie Bench M.D.   On: 10/27/2022 22:21   CT Head Wo Contrast  Result Date: 10/27/2022 CLINICAL DATA:  Multiple falls. EXAM: CT HEAD WITHOUT CONTRAST TECHNIQUE: Contiguous axial images were obtained from the base of the skull through the vertex without intravenous contrast. RADIATION DOSE REDUCTION: This exam was performed according to the departmental dose-optimization program which includes automated exposure control, adjustment of the mA and/or kV according to patient size and/or use of iterative reconstruction technique. COMPARISON:  None Available. FINDINGS: Brain: There is mild cerebral atrophy with widening of the extra-axial spaces and ventricular dilatation. There are areas of decreased attenuation within the white matter tracts of the supratentorial brain, consistent with microvascular disease changes. A 4 mm cortical calcification is seen within the left occipital lobe. Vascular: Marked severity bilateral cavernous carotid artery calcification is noted. Skull: Normal. Negative for fracture or focal lesion. Sinuses/Orbits: No acute finding. Other: None. IMPRESSION: 1. No acute intracranial abnormality. 2. Cerebral atrophy with chronic white matter small vessel ischemic changes. Electronically Signed   By: Virgina Norfolk M.D.   On: 10/27/2022 21:12    Procedures Procedures   Medications Ordered in ED Medications  lactated ringers bolus 1,000 mL (1,000 mLs Intravenous New Bag/Given 10/27/22 2224)    ED  Course/ Medical Decision Making/ A&P Clinical Course as of 10/27/22 2329  Thu Oct 27, 2022  2216 Creatinine(!): 1.67 Increased from baseline of 1.3 [RP]  2258 DM, HTN, LLE weakness new.  Wakeup sx for today.   [WF]  2304 Signed out to [place].   [RP]  2311 Dr Lorrin Goodell from neuro recommends admission to medicine for stroke evaluation.  [RP]    Clinical Course User Index [RP] Fransico Meadow, MD [WF] Tedd Sias, [place]                             Medical Decision Making Amount and/or Complexity of Data Reviewed Labs: ordered. Decision-making details documented in ED Course. Radiology: ordered.  Risk Decision regarding hospitalization.   [name] [name]. is a 77 y.o. male with comorbidities that complicate the patient evaluation including diabetes and degenerative disc disease presents emergency department for extremity weakness.  Initial Ddx:  Stroke, radiculopathy, spinal cord compression, hip fracture, polypharmacy, arrhythmia  MDM:  Concerned about possible stroke given the patient's left lower extremity weakness.  Does appear to be having back pain today that would suggest spinal cord compression or radiculopathy causing the patient's symptoms.  Patient does not have any cortical signs that would suggest LVO and is not candidate for acute intervention at this time.  Does have mild hip pain that started after a fall but reports that his leg was feeling weak before that so we will obtain x-rays to rule out hip fracture.  Feel that this is less likely though because he is been ambulatory since his fall.  Did also consider polypharmacy but methocarbamol was taken yesterday and has not had any today so feel this is less likely.  With his occasional dizziness could be reflective of either paroxysmal arrhythmia but would not explain his left lower extremity weakness.  Plan:  Labs CT head Hip x-ray  ED Summary/Re-evaluation:  Patient underwent the above workup did  not reveal any abnormalities aside from AKI and mild leukocytosis.  Patient was given fluids.  Discussed with neurology felt that he should be admitted for stroke workup.  Medicine was paged and awaiting their response at this time.  This patient presents to the ED for concern of complaints listed in HPI, this involves an extensive number of treatment options, and is a complaint that carries with it a high risk of complications and morbidity. Disposition including potential need for admission considered.   Dispo: Admit to Floor  Additional history obtained from spouse Records reviewed Outpatient Clinic Notes The following labs were independently interpreted: Chemistry and show AKI I independently reviewed the following imaging with scope of interpretation limited to determining acute life threatening conditions related to emergency care: CT Head and agree with the radiologist interpretation with the following exceptions: - I personally reviewed and interpreted cardiac monitoring: normal sinus rhythm  I personally reviewed and interpreted the pt's EKG: see above for interpretation  I have reviewed the patients home medications and made adjustments as needed Consults: Neurology Social Determinants of health:  Elderly   Final Clinical Impression(s) / ED Diagnoses Final diagnoses:  Left-sided weakness  Fall, initial encounter    Rx / DC Orders ED Discharge Orders     None         Fransico Meadow, MD 10/27/22 2329

## 2022-10-27 NOTE — Telephone Encounter (Signed)
Yes pain meds like Tramadol can cause constipation. In addition to the Miralax, take one tablespoon of Milk of Magnesia 4 times a day as needed

## 2022-10-27 NOTE — Telephone Encounter (Signed)
Pt was just seen on 10/25/22.  Pt states MD told him to call if he is still experiencing pain.   Pt states he has had 2 falls since last OV and has been having pain in his hip.  Pt would like a call back at MD's earliest convenience.

## 2022-10-27 NOTE — ED Triage Notes (Addendum)
Pt arrives POV from home with his wife.  He says he has fallen 3x today and this is very unusual for him  He reports some left leg weakness that started after he experienced back pain a couple days ago.  He has felt a little lightheaded and dizzy.  Denies LOC with falling.  Denies hitting his head.  Denies blood thinners.  Denies chest pain and shortness of breath.  Ambulatory to triage, in NAD.

## 2022-10-27 NOTE — Telephone Encounter (Signed)
Pt states MD did not address the 2 falls when he sent a message to Pt regarding constipation.   Pt was informed MD has already gone for the day.   Pt is asking for a call back at MD's earliest convenience.

## 2022-10-27 NOTE — Telephone Encounter (Signed)
He can take this either way

## 2022-10-28 ENCOUNTER — Other Ambulatory Visit: Payer: Self-pay | Admitting: Family Medicine

## 2022-10-28 ENCOUNTER — Emergency Department (HOSPITAL_BASED_OUTPATIENT_CLINIC_OR_DEPARTMENT_OTHER): Payer: BC Managed Care – PPO

## 2022-10-28 ENCOUNTER — Observation Stay (HOSPITAL_COMMUNITY): Admission: RE | Admit: 2022-10-28 | Payer: BC Managed Care – PPO | Source: Ambulatory Visit

## 2022-10-28 DIAGNOSIS — R29818 Other symptoms and signs involving the nervous system: Secondary | ICD-10-CM | POA: Diagnosis not present

## 2022-10-28 DIAGNOSIS — R2990 Unspecified symptoms and signs involving the nervous system: Secondary | ICD-10-CM | POA: Diagnosis not present

## 2022-10-28 DIAGNOSIS — Z043 Encounter for examination and observation following other accident: Secondary | ICD-10-CM | POA: Diagnosis not present

## 2022-10-28 DIAGNOSIS — M4316 Spondylolisthesis, lumbar region: Secondary | ICD-10-CM | POA: Diagnosis not present

## 2022-10-28 DIAGNOSIS — M5126 Other intervertebral disc displacement, lumbar region: Secondary | ICD-10-CM | POA: Diagnosis not present

## 2022-10-28 LAB — COMPREHENSIVE METABOLIC PANEL
ALT: 20 U/L (ref 0–44)
AST: 21 U/L (ref 15–41)
Albumin: 3.9 g/dL (ref 3.5–5.0)
Alkaline Phosphatase: 57 U/L (ref 38–126)
Anion gap: 10 (ref 5–15)
BUN: 35 mg/dL — ABNORMAL HIGH (ref 8–23)
CO2: 23 mmol/L (ref 22–32)
Calcium: 9.2 mg/dL (ref 8.9–10.3)
Chloride: 103 mmol/L (ref 98–111)
Creatinine, Ser: 1.29 mg/dL — ABNORMAL HIGH (ref 0.61–1.24)
GFR, Estimated: 57 mL/min — ABNORMAL LOW (ref >60)
Glucose, Bld: 182 mg/dL — ABNORMAL HIGH (ref 70–99)
Potassium: 4.2 mmol/L (ref 3.5–5.1)
Sodium: 136 mmol/L (ref 135–145)
Total Bilirubin: 0.8 mg/dL (ref 0.3–1.2)
Total Protein: 6.3 g/dL — ABNORMAL LOW (ref 6.5–8.1)

## 2022-10-28 LAB — CBC
HCT: 42.4 % (ref 39.0–52.0)
Hemoglobin: 14.4 g/dL (ref 13.0–17.0)
MCH: 32.4 pg (ref 26.0–34.0)
MCHC: 34 g/dL (ref 30.0–36.0)
MCV: 95.3 fL (ref 80.0–100.0)
Platelets: 165 10*3/uL (ref 150–400)
RBC: 4.45 MIL/uL (ref 4.22–5.81)
RDW: 13.6 % (ref 11.5–15.5)
WBC: 8.9 10*3/uL (ref 4.0–10.5)
nRBC: 0 % (ref 0.0–0.2)

## 2022-10-28 LAB — GLUCOSE, CAPILLARY: Glucose-Capillary: 197 mg/dL — ABNORMAL HIGH (ref 70–99)

## 2022-10-28 MED ORDER — METHOTREXATE 2.5 MG PO TABS: 10.0000 mg | ORAL_TABLET | ORAL | Status: DC

## 2022-10-28 MED ORDER — TRAMADOL HCL 50 MG PO TABS: 25.0000 mg | ORAL_TABLET | Freq: Four times a day (QID) | ORAL | Status: DC | PRN

## 2022-10-28 MED ORDER — ACETAMINOPHEN 325 MG PO TABS: 650.0000 mg | ORAL_TABLET | ORAL | Status: DC | PRN

## 2022-10-28 MED ORDER — INSULIN ASPART 100 UNIT/ML IJ SOLN: 0.0000 [IU] | Freq: Every day | INTRAMUSCULAR | Status: DC

## 2022-10-28 MED ORDER — GLIPIZIDE 10 MG PO TABS
10.0000 mg | ORAL_TABLET | Freq: Two times a day (BID) | ORAL | Status: DC
  Filled 2022-10-28: qty 1

## 2022-10-28 MED ORDER — ROSUVASTATIN CALCIUM 20 MG PO TABS: 20.0000 mg | ORAL_TABLET | Freq: Every day | ORAL | Status: DC

## 2022-10-28 MED ORDER — STROKE: EARLY STAGES OF RECOVERY BOOK: Freq: Once | Status: DC

## 2022-10-28 MED ORDER — ADALIMUMAB 40 MG/0.4ML ~~LOC~~ AJKT: 40.0000 mg | AUTO-INJECTOR | SUBCUTANEOUS | Status: DC

## 2022-10-28 MED ORDER — INSULIN ASPART 100 UNIT/ML IJ SOLN: 0.0000 [IU] | Freq: Three times a day (TID) | INTRAMUSCULAR | Status: DC

## 2022-10-28 MED ORDER — ASPIRIN 81 MG PO TBEC: 81.0000 mg | DELAYED_RELEASE_TABLET | Freq: Every day | ORAL | 12 refills | Status: DC

## 2022-10-28 MED ORDER — LISINOPRIL 10 MG PO TABS: 10.0000 mg | ORAL_TABLET | Freq: Every day | ORAL | Status: DC

## 2022-10-28 MED ORDER — METHOCARBAMOL 500 MG PO TABS: 500.0000 mg | ORAL_TABLET | Freq: Two times a day (BID) | ORAL | Status: DC

## 2022-10-28 MED ORDER — GADOBUTROL 1 MMOL/ML IV SOLN
8.2000 mL | Freq: Once | INTRAVENOUS | Status: AC | PRN
  Administered 2022-10-28: 8.2 mL via INTRAVENOUS

## 2022-10-28 MED ORDER — ONE-DAILY MULTI VITAMINS PO TABS: 1.0000 | ORAL_TABLET | Freq: Every day | ORAL | Status: DC

## 2022-10-28 MED ORDER — ASPIRIN 81 MG PO TBEC: 81.0000 mg | DELAYED_RELEASE_TABLET | Freq: Every day | ORAL | Status: DC

## 2022-10-28 MED ORDER — ENOXAPARIN SODIUM 40 MG/0.4ML IJ SOSY
40.0000 mg | PREFILLED_SYRINGE | Freq: Every day | INTRAMUSCULAR | Status: DC
  Filled 2022-10-28: qty 0.4

## 2022-10-28 MED ORDER — ASPIRIN 300 MG RE SUPP: 300.0000 mg | Freq: Every day | RECTAL | Status: DC

## 2022-10-28 MED ORDER — TRAZODONE HCL 50 MG PO TABS
50.0000 mg | ORAL_TABLET | Freq: Once | ORAL | Status: AC
  Administered 2022-10-28: 50 mg via ORAL
  Filled 2022-10-28: qty 1

## 2022-10-28 MED ORDER — PIOGLITAZONE HCL 30 MG PO TABS
30.0000 mg | ORAL_TABLET | Freq: Every day | ORAL | Status: DC
  Filled 2022-10-28: qty 1

## 2022-10-28 MED ORDER — ACETAMINOPHEN 650 MG RE SUPP: 650.0000 mg | RECTAL | Status: DC | PRN

## 2022-10-28 MED ORDER — ASPIRIN 325 MG PO TABS: 325.0000 mg | ORAL_TABLET | Freq: Every day | ORAL | Status: DC

## 2022-10-28 NOTE — Discharge Summary (Signed)
Physician Discharge Summary  [name] [name]. [id number] DOB: 02-13-1946 DOA: 10/27/2022  PCP: Laurey Morale, MD  Admit date: 10/27/2022 Discharge date: 10/28/2022  Admitted From: home Disposition:  home  Recommendations for Outpatient Follow-up:  Follow up with PCP in 1-2 weeks Follow up with Dr Christella Noa in 1 week  [facility]: none Equipment/Devices: none  Discharge Condition: stable CODE STATUS: Full code  HPI: Per admitting MD, HPI / Interim summary 77 yo M with chronic uveitis on immunological agents, DM2, CKD 3A, HTN, HLD comes into the hospital after having 3 falls preceded by back pain radiating onto his left lower extremity.  PCP saw him as an outpatient and felt like he had sciatica and placed him on medications for that.  Upon falling several times he decided to come to the ER.  There were initial concerns for TIA and he was admitted to the hospital.  Hospital Course / Discharge diagnoses: Principal Problem:   Stroke-like symptoms   Principal problem Left-sided sciatica, falls -Case was discussed with neurology and neurosurgery.  This was not felt to be TIA or CVA, and imaging was negative for stroke.  Lumbar spine MRI showed a large herniated disc at the L4/5 on the left.  Neurosurgery evaluated patient, and feels like this explains all of his presenting symptoms.  Dr. Christella Noa recommends operative decompression and fusion, but this can be done as an outpatient.  Patient has remained stable, he has no neurological deficits, ambulatory, and will be discharged home in stable condition.  Given a degree of small vessel intracranial disease neurology did recommend to start low-dose aspirin 81 mg.   Active problems Left cavernous ICA, 4 mm -this will be monitored as an outpatient.  No current interventions are indicated DM2-resume home medications CKD 3A -creatinine at baseline Uveitis on chronic immunosuppressant agents -continue home medications  Sepsis ruled  out   Discharge Instructions   Allergies as of 10/28/2022       Reactions   Contrast Media [iodinated Contrast Media] Hives, Itching   Hives and itching after IV contrast injection   Red Dye    hives   Fluorescein Other (See Comments)   Ioxaglate Itching, Hives   Hives and itching after IV contrast injection        Medication List     TAKE these medications    allopurinol 100 MG tablet Commonly known as: ZYLOPRIM TAKE 1 TABLET(100 MG) BY MOUTH DAILY   aspirin EC 81 MG tablet Take 1 tablet (81 mg total) by mouth daily. Swallow whole.   brimonidine 0.2 % ophthalmic solution Commonly known as: ALPHAGAN Place 1 drop into both eyes as needed. Used 1 or 2 a year   calcium-vitamin D 500-200 MG-UNIT tablet Commonly known as: OSCAL WITH D Take 2 tablets by mouth daily.   colchicine 0.6 MG tablet Take 1 tablet (0.6 mg total) by mouth every 6 (six) hours as needed (gout).   Contour Next Test test strip Generic drug: glucose blood TEST BLOOD SUGAR THREE TO FOUR TIMES DAILY   fluorouracil 5 % cream Commonly known as: EFUDEX Apply topically.   FOLIC ACID PO Take 10 mg by mouth daily.   gabapentin 300 MG capsule Commonly known as: NEURONTIN TAKE 1 CAPSULE(300 MG) BY MOUTH TWICE DAILY   glipiZIDE 10 MG tablet Commonly known as: GLUCOTROL Take 1 tablet (10 mg total) by mouth 2 (two) times daily before a meal.   Humira (2 Pen) 40 MG/0.4ML Pnkt Generic drug: Adalimumab SMARTSIG:40  Milligram(s) SUB-Q Every 2 Weeks   lisinopril 10 MG tablet Commonly known as: ZESTRIL Take 1 tablet (10 mg total) by mouth daily.   methocarbamol 500 MG tablet Commonly known as: ROBAXIN Take 1-2 tablets (500-1,000 mg total) by mouth 2 (two) times daily.   methotrexate 2.5 MG tablet Commonly known as: RHEUMATREX Take 4 tablets by mouth once a week.   methylPREDNISolone 4 MG Tbpk tablet Commonly known as: MEDROL DOSEPAK As directed   Microlet Lancets Misc TEST DAILY    multivitamin tablet Take 1 tablet by mouth daily.   pioglitazone 30 MG tablet Commonly known as: Actos Take 1 tablet (30 mg total) by mouth daily.   rosuvastatin 20 MG tablet Commonly known as: CRESTOR TAKE 1 TABLET BY MOUTH DAILY   timolol 0.5 % ophthalmic solution Commonly known as: TIMOPTIC 1 drop 2 (two) times daily.   traMADol 50 MG tablet Commonly known as: ULTRAM TAKE 2 TABLETS(100 MG) BY MOUTH EVERY 6 HOURS AS NEEDED FOR SEVERE PAIN   zolpidem 10 MG tablet Commonly known as: AMBIEN TAKE 1 TABLET(10 MG) BY MOUTH AT BEDTIME        Follow-up Information     Ashok Pall, MD Follow up.   Specialty: Neurosurgery Why: please call the office to make an appointment Contact information: [address] [place] [address] [phone]                 Consultations: Neurosurgery   Procedures/Studies:  MR BRAIN WO CONTRAST  Result Date: 10/28/2022 CLINICAL DATA:  Fall 3 times yesterday.  Neuro deficit. EXAM: MRI HEAD WITHOUT CONTRAST MRA HEAD WITHOUT CONTRAST MRA NECK WITHOUT AND WITH CONTRAST TECHNIQUE: Multiplanar, multi-echo pulse sequences of the brain and surrounding structures were acquired without intravenous contrast. Angiographic images of the Circle of Willis were acquired using MRA technique without intravenous contrast. Angiographic images of the neck were acquired using MRA technique without and with intravenous contrast. Carotid stenosis measurements (when applicable) are obtained utilizing NASCET criteria, using the distal internal carotid diameter as the denominator. CONTRAST:  8.41m GADAVIST GADOBUTROL 1 MMOL/ML IV SOLN COMPARISON:  CT head 1 day prior FINDINGS: MRI HEAD FINDINGS Brain: There is no acute intracranial hemorrhage, extra-axial fluid collection, or acute infarct. Parenchymal volume is normal. The ventricles are normal in size. Parenchymal signal is normal. There is no significant burden of underlying chronic  small-vessel ischemic change The pituitary and suprasellar region are normal. There is no mass lesion. There is no mass effect or midline shift. Vascular: See below. Skull and upper cervical spine: Normal marrow signal. Sinuses/Orbits: There is mild mucosal thickening in the paranasal sinuses. Bilateral lens implants are in place. The globes and orbits are otherwise unremarkable. Other: None. MRA HEAD FINDINGS Anterior circulation: The intracranial ICAs are patent, without significant stenosis or occlusion. The bilateral M1 segments are patent. There is relatively diminutive enhancement of an inferior right M2/M3 branch in the sylvian fissure. There is no other proximal stenosis or occlusion. The bilateral ACAs are patent, without proximal stenosis or occlusion. The anterior communicating artery is normal. There is an apparent 4 mm superolaterally directed outpouching arising from the cavernous segment of the left ICA (key image, 1-115, 103-13). Posterior circulation: The bilateral V4 segments are patent. The basilar artery is patent. The major cerebellar arteries appear patent. There is a relatively diminutive enhancement of the distal right PCA branches which may reflect hemodynamically significant stenosis. The left PCA is patent. There is a fetal origin of the right  PCA. There is no aneurysm or AVM. Anatomic variants: None. MRA NECK FINDINGS Aortic arch: The imaged aortic arch is normal. The origins of the major branch vessels are patent. The subclavian arteries are patent to the level imaged. Right carotid system: The right common, internal, and external carotid arteries are patent, without hemodynamically significant stenosis or occlusion. There is no dissection or aneurysm. Left carotid system: The left common, internal, and external carotid arteries are patent, without hemodynamically significant stenosis or occlusion. There is no evidence of dissection or aneurysm. Vertebral arteries: The vertebral arteries  are patent with antegrade flow. There is no evidence of hemodynamically significant stenosis or occlusion. There is no evidence of dissection or aneurysm. Other: None IMPRESSION: 1. Normal noncontrast brain MRI. 2. Relatively diminutive enhancement of the distal right PCA branches and an inferior right M2/M3 branch likely reflecting hemodynamically significant stenosis. Otherwise, patent intracranial vasculature. 3. Suspect 4 mm aneurysm arising from the left cavernous ICA. Consider CT head for better evaluation. This could be obtained on a nonemergent outpatient basis. 4. Patent vasculature of the neck with no hemodynamically significant stenosis, occlusion, or dissection. Electronically Signed   By: Valetta Mole M.D.   On: 10/28/2022 08:22   MR ANGIO NECK W WO CONTRAST  Result Date: 10/28/2022 CLINICAL DATA:  Fall 3 times yesterday.  Neuro deficit. EXAM: MRI HEAD WITHOUT CONTRAST MRA HEAD WITHOUT CONTRAST MRA NECK WITHOUT AND WITH CONTRAST TECHNIQUE: Multiplanar, multi-echo pulse sequences of the brain and surrounding structures were acquired without intravenous contrast. Angiographic images of the Circle of Willis were acquired using MRA technique without intravenous contrast. Angiographic images of the neck were acquired using MRA technique without and with intravenous contrast. Carotid stenosis measurements (when applicable) are obtained utilizing NASCET criteria, using the distal internal carotid diameter as the denominator. CONTRAST:  8.41m GADAVIST GADOBUTROL 1 MMOL/ML IV SOLN COMPARISON:  CT head 1 day prior FINDINGS: MRI HEAD FINDINGS Brain: There is no acute intracranial hemorrhage, extra-axial fluid collection, or acute infarct. Parenchymal volume is normal. The ventricles are normal in size. Parenchymal signal is normal. There is no significant burden of underlying chronic small-vessel ischemic change The pituitary and suprasellar region are normal. There is no mass lesion. There is no mass effect  or midline shift. Vascular: See below. Skull and upper cervical spine: Normal marrow signal. Sinuses/Orbits: There is mild mucosal thickening in the paranasal sinuses. Bilateral lens implants are in place. The globes and orbits are otherwise unremarkable. Other: None. MRA HEAD FINDINGS Anterior circulation: The intracranial ICAs are patent, without significant stenosis or occlusion. The bilateral M1 segments are patent. There is relatively diminutive enhancement of an inferior right M2/M3 branch in the sylvian fissure. There is no other proximal stenosis or occlusion. The bilateral ACAs are patent, without proximal stenosis or occlusion. The anterior communicating artery is normal. There is an apparent 4 mm superolaterally directed outpouching arising from the cavernous segment of the left ICA (key image, 1-115, 103-13). Posterior circulation: The bilateral V4 segments are patent. The basilar artery is patent. The major cerebellar arteries appear patent. There is a relatively diminutive enhancement of the distal right PCA branches which may reflect hemodynamically significant stenosis. The left PCA is patent. There is a fetal origin of the right PCA. There is no aneurysm or AVM. Anatomic variants: None. MRA NECK FINDINGS Aortic arch: The imaged aortic arch is normal. The origins of the major branch vessels are patent. The subclavian arteries are patent to the level imaged. Right carotid system: The right  common, internal, and external carotid arteries are patent, without hemodynamically significant stenosis or occlusion. There is no dissection or aneurysm. Left carotid system: The left common, internal, and external carotid arteries are patent, without hemodynamically significant stenosis or occlusion. There is no evidence of dissection or aneurysm. Vertebral arteries: The vertebral arteries are patent with antegrade flow. There is no evidence of hemodynamically significant stenosis or occlusion. There is no  evidence of dissection or aneurysm. Other: None IMPRESSION: 1. Normal noncontrast brain MRI. 2. Relatively diminutive enhancement of the distal right PCA branches and an inferior right M2/M3 branch likely reflecting hemodynamically significant stenosis. Otherwise, patent intracranial vasculature. 3. Suspect 4 mm aneurysm arising from the left cavernous ICA. Consider CT head for better evaluation. This could be obtained on a nonemergent outpatient basis. 4. Patent vasculature of the neck with no hemodynamically significant stenosis, occlusion, or dissection. Electronically Signed   By: Valetta Mole M.D.   On: 10/28/2022 08:22   MR ANGIO HEAD WO CONTRAST  Result Date: 10/28/2022 CLINICAL DATA:  Fall 3 times yesterday.  Neuro deficit. EXAM: MRI HEAD WITHOUT CONTRAST MRA HEAD WITHOUT CONTRAST MRA NECK WITHOUT AND WITH CONTRAST TECHNIQUE: Multiplanar, multi-echo pulse sequences of the brain and surrounding structures were acquired without intravenous contrast. Angiographic images of the Circle of Willis were acquired using MRA technique without intravenous contrast. Angiographic images of the neck were acquired using MRA technique without and with intravenous contrast. Carotid stenosis measurements (when applicable) are obtained utilizing NASCET criteria, using the distal internal carotid diameter as the denominator. CONTRAST:  8.26m GADAVIST GADOBUTROL 1 MMOL/ML IV SOLN COMPARISON:  CT head 1 day prior FINDINGS: MRI HEAD FINDINGS Brain: There is no acute intracranial hemorrhage, extra-axial fluid collection, or acute infarct. Parenchymal volume is normal. The ventricles are normal in size. Parenchymal signal is normal. There is no significant burden of underlying chronic small-vessel ischemic change The pituitary and suprasellar region are normal. There is no mass lesion. There is no mass effect or midline shift. Vascular: See below. Skull and upper cervical spine: Normal marrow signal. Sinuses/Orbits: There is mild  mucosal thickening in the paranasal sinuses. Bilateral lens implants are in place. The globes and orbits are otherwise unremarkable. Other: None. MRA HEAD FINDINGS Anterior circulation: The intracranial ICAs are patent, without significant stenosis or occlusion. The bilateral M1 segments are patent. There is relatively diminutive enhancement of an inferior right M2/M3 branch in the sylvian fissure. There is no other proximal stenosis or occlusion. The bilateral ACAs are patent, without proximal stenosis or occlusion. The anterior communicating artery is normal. There is an apparent 4 mm superolaterally directed outpouching arising from the cavernous segment of the left ICA (key image, 1-115, 103-13). Posterior circulation: The bilateral V4 segments are patent. The basilar artery is patent. The major cerebellar arteries appear patent. There is a relatively diminutive enhancement of the distal right PCA branches which may reflect hemodynamically significant stenosis. The left PCA is patent. There is a fetal origin of the right PCA. There is no aneurysm or AVM. Anatomic variants: None. MRA NECK FINDINGS Aortic arch: The imaged aortic arch is normal. The origins of the major branch vessels are patent. The subclavian arteries are patent to the level imaged. Right carotid system: The right common, internal, and external carotid arteries are patent, without hemodynamically significant stenosis or occlusion. There is no dissection or aneurysm. Left carotid system: The left common, internal, and external carotid arteries are patent, without hemodynamically significant stenosis or occlusion. There is no evidence of dissection  or aneurysm. Vertebral arteries: The vertebral arteries are patent with antegrade flow. There is no evidence of hemodynamically significant stenosis or occlusion. There is no evidence of dissection or aneurysm. Other: None IMPRESSION: 1. Normal noncontrast brain MRI. 2. Relatively diminutive enhancement  of the distal right PCA branches and an inferior right M2/M3 branch likely reflecting hemodynamically significant stenosis. Otherwise, patent intracranial vasculature. 3. Suspect 4 mm aneurysm arising from the left cavernous ICA. Consider CT head for better evaluation. This could be obtained on a nonemergent outpatient basis. 4. Patent vasculature of the neck with no hemodynamically significant stenosis, occlusion, or dissection. Electronically Signed   By: Valetta Mole M.D.   On: 10/28/2022 08:22   MR LUMBAR SPINE WO CONTRAST  Result Date: 10/28/2022 CLINICAL DATA:  Fall 3 times yesterday. Left lower extremity weakness EXAM: MRI LUMBAR SPINE WITHOUT CONTRAST TECHNIQUE: Multiplanar, multisequence MR imaging of the lumbar spine was performed. No intravenous contrast was administered. COMPARISON:  05/07/2014 FINDINGS: Segmentation:  Standard. Alignment:  Degenerative anterolisthesis at L4-5. Vertebrae:  No fracture, evidence of discitis, or bone lesion. Conus medullaris and cauda equina: Conus extends to the T12-L1 level. Conus and cauda equina appear normal. Paraspinal and other soft tissues: Negative for perispinal mass or inflammation. Left colonic diverticulosis. Patulous bladder with trabeculation suggesting chronic outlet obstruction. Disc levels: T12- L1: Unremarkable. L1-L2: Disc narrowing and spondylitic spurring with tiny left paracentral herniation L2-L3: Disc narrowing and endplate degeneration. Mild endplate and facet spurring. L3-L4: Disc narrowing and bulging with endplate ridging. Degenerative facet spurring on both sides L4-L5: Degenerative facet spurring which is bulky with ligamentum flavum thickening and joint effusions. Associated anterolisthesis. Severe spinal stenosis. There is a left paracentral superiorly migrating disc extrusion since prior markedly compressing the descending left L4 and L5 nerve roots. L5-S1:Advanced disc degeneration with intervertebral ankylosis. No neural impingement.  IMPRESSION: 1. L4-5 left paracentral extrusion with upward migration and prominent left L4 and L5 impingement. At the same level there is severe facet osteoarthritis with anterolisthesis and advanced spinal stenosis. 2. Ankylosis has occurred at the L5-S1 disc space since prior. Electronically Signed   By: Jorje Guild M.D.   On: 10/28/2022 07:54   DG Hip Unilat W or Wo Pelvis 2-3 Views Left  Result Date: 10/27/2022 CLINICAL DATA:  Left hip pain EXAM: DG HIP (WITH OR WITHOUT PELVIS) 2V LEFT COMPARISON:  None Available. FINDINGS: There is no evidence of hip fracture or dislocation. There is no evidence of arthropathy or other focal bone abnormality. IMPRESSION: Negative. Electronically Signed   By: Sammie Bench M.D.   On: 10/27/2022 22:21   CT Head Wo Contrast  Result Date: 10/27/2022 CLINICAL DATA:  Multiple falls. EXAM: CT HEAD WITHOUT CONTRAST TECHNIQUE: Contiguous axial images were obtained from the base of the skull through the vertex without intravenous contrast. RADIATION DOSE REDUCTION: This exam was performed according to the departmental dose-optimization program which includes automated exposure control, adjustment of the mA and/or kV according to patient size and/or use of iterative reconstruction technique. COMPARISON:  None Available. FINDINGS: Brain: There is mild cerebral atrophy with widening of the extra-axial spaces and ventricular dilatation. There are areas of decreased attenuation within the white matter tracts of the supratentorial brain, consistent with microvascular disease changes. A 4 mm cortical calcification is seen within the left occipital lobe. Vascular: Marked severity bilateral cavernous carotid artery calcification is noted. Skull: Normal. Negative for fracture or focal lesion. Sinuses/Orbits: No acute finding. Other: None. IMPRESSION: 1. No acute intracranial abnormality. 2. Cerebral atrophy  with chronic white matter small vessel ischemic changes. Electronically  Signed   By: Virgina Norfolk M.D.   On: 10/27/2022 21:12   DG Lumbar Spine 2-3 Views  Result Date: 10/25/2022 CLINICAL DATA:  Low back pain radiating down left leg. EXAM: LUMBAR SPINE - 2-3 VIEW COMPARISON:  05/07/2014. FINDINGS: There is no evidence of lumbar spine fracture. There is anterolisthesis at L4-L5 and mild retrolisthesis at L2-L3. Multilevel intervertebral disc space narrowing, degenerative endplate changes, and facet arthropathy. Atherosclerotic calcification of the aorta is noted. IMPRESSION: 1. No acute fracture. 2. Multilevel degenerative changes. Electronically Signed   By: Brett Fairy M.D.   On: 10/25/2022 02:34     Subjective: - no chest pain, shortness of breath, no abdominal pain, nausea or vomiting.   Discharge Exam: BP 128/71 (BP Location: Right Arm)   Pulse 70   Temp 97.9 F (36.6 C) (Oral)   Resp 20   Ht 6' 1"$  (1.854 m)   Wt 82.1 kg   SpO2 98%   BMI 23.88 kg/m   General: Pt is alert, awake, not in acute distress Cardiovascular: RRR, S1/S2 +, no rubs, no gallops Respiratory: CTA bilaterally, no wheezing, no rhonchi Abdominal: Soft, NT, ND, bowel sounds + Extremities: no edema, no cyanosis   The results of significant diagnostics from this hospitalization (including imaging, microbiology, ancillary and laboratory) are listed below for reference.     Microbiology: No results found for this or any previous visit (from the past 240 hour(s)).   Labs: Basic Metabolic Panel: Recent Labs  Lab 10/27/22 1916 10/28/22 1002  NA 132* 136  K 4.3 4.2  CL 98 103  CO2 24 23  GLUCOSE 236* 182*  BUN 47* 35*  CREATININE 1.67* 1.29*  CALCIUM 9.9 9.2   Liver Function Tests: Recent Labs  Lab 10/28/22 1002  AST 21  ALT 20  ALKPHOS 57  BILITOT 0.8  PROT 6.3*  ALBUMIN 3.9   CBC: Recent Labs  Lab 10/27/22 1916 10/28/22 1002  WBC 13.3* 8.9  HGB 14.9 14.4  HCT 43.2 42.4  MCV 94.3 95.3  PLT 198 165   CBG: Recent Labs  Lab 10/28/22 1124  GLUCAP  197*   Hgb A1c No results for input(s): "HGBA1C" in the last 72 hours. Lipid Profile No results for input(s): "CHOL", "HDL", "LDLCALC", "TRIG", "CHOLHDL", "LDLDIRECT" in the last 72 hours. Thyroid function studies No results for input(s): "TSH", "T4TOTAL", "T3FREE", "THYROIDAB" in the last 72 hours.  Invalid input(s): "FREET3" Urinalysis    Component Value Date/Time   COLORURINE LT. YELLOW 10/14/2010 0825   APPEARANCEUR CLEAR 10/14/2010 0825   LABSPEC 1.015 10/14/2010 0825   PHURINE 6.0 10/14/2010 0825   GLUCOSEU >=1000 10/14/2010 0825   HGBUR NEGATIVE 10/14/2010 0825   HGBUR negative 04/19/2010 1453   BILIRUBINUR n 04/29/2019 1020   KETONESUR negative 10/29/2014 0856   KETONESUR NEGATIVE 10/14/2010 0825   PROTEINUR Positive (A) 04/29/2019 1020   UROBILINOGEN 0.2 04/29/2019 1020   UROBILINOGEN 0.2 10/14/2010 0825   NITRITE n 04/29/2019 1020   NITRITE NEGATIVE 10/14/2010 0825   LEUKOCYTESUR Negative 04/29/2019 1020    FURTHER DISCHARGE INSTRUCTIONS:   Get Medicines reviewed and adjusted: Please take all your medications with you for your next visit with your Primary MD   Laboratory/radiological data: Please request your Primary MD to go over all hospital tests and procedure/radiological results at the follow up, please ask your Primary MD to get all Hospital records sent to his/her office.   In some cases,  they will be blood work, cultures and biopsy results pending at the time of your discharge. Please request that your primary care M.D. goes through all the records of your hospital data and follows up on these results.   Also Note the following: If you experience worsening of your admission symptoms, develop shortness of breath, life threatening emergency, suicidal or homicidal thoughts you must seek medical attention immediately by calling 911 or calling your MD immediately  if symptoms less severe.   You must read complete instructions/literature along with all the  possible adverse reactions/side effects for all the Medicines you take and that have been prescribed to you. Take any new Medicines after you have completely understood and accpet all the possible adverse reactions/side effects.    Do not drive when taking Pain medications or sleeping medications (Benzodaizepines)   Do not take more than prescribed Pain, Sleep and Anxiety Medications. It is not advisable to combine anxiety,sleep and pain medications without talking with your primary care practitioner   Special Instructions: If you have smoked or chewed Tobacco  in the last 2 yrs please stop smoking, stop any regular Alcohol  and or any Recreational drug use.   Wear Seat belts while driving.   Please note: You were cared for by a hospitalist during your hospital stay. Once you are discharged, your primary care physician will handle any further medical issues. Please note that NO REFILLS for any discharge medications will be authorized once you are discharged, as it is imperative that you return to your primary care physician (or establish a relationship with a primary care physician if you do not have one) for your post hospital discharge needs so that they can reassess your need for medications and monitor your lab values.  Time coordinating discharge: 35 minutes  SIGNED:  Marzetta Board, MD, PhD 10/28/2022, 11:54 AM

## 2022-10-28 NOTE — ED Notes (Signed)
Kiana with [facility] called for transport  

## 2022-10-28 NOTE — Progress Notes (Signed)
OT Cancellation Note  Patient Details Name: [name] [name]. MRN: [id number] DOB: 1946-09-08   Cancelled Treatment:    Reason Eval/Treat Not Completed: Other (comment).  Patient discharging home.    Cloe Sockwell D Habeeb Puertas 10/28/2022, 11:57 AM 10/28/2022  RP, OTR/L  [facility]  Office:  [phone]

## 2022-10-28 NOTE — ED Notes (Signed)
Attempted report for pt for [address]

## 2022-10-28 NOTE — ED Notes (Signed)
Report given to the floor RN... Report given to [facility]link.Marland KitchenMarland Kitchen

## 2022-10-28 NOTE — Telephone Encounter (Signed)
FYI

## 2022-10-28 NOTE — Telephone Encounter (Signed)
Patient is currently in the hospital.

## 2022-10-28 NOTE — Progress Notes (Signed)
Patient arrived to room 3W38 in NAD, VS stable. Patient free from pain and oriented to room. MD paged to come see the patient.

## 2022-10-28 NOTE — Plan of Care (Signed)
Spoke to Dr. Wyvonnia Dusky.  77 year old male with history of diabetes, gout, hypertension, hyperlipidemia, left-sided sciatica and other medical comorbidities presented to [facility] ED with recurrent falls secondary to left leg weakness.  No back pain.  Labs showing WBC 13.3, sodium 132, glucose 236, BUN 47, creatinine 1.6.  X-ray of left hip/pelvis negative.  CT head negative for acute finding.  Patient was given 1 L LR bolus.  EDP spoke to neurology, recommended admission for stroke workup including brain MRI and MRA head and neck.  Also recommended MRI of lumbar spine.  [facility] will assume care on arrival to accepting facility. Until arrival, care as per EDP. However, [facility] available 24/7 for questions and assistance.  Nursing staff, please page [place] and Consults [phone]) as soon as the patient arrives to the hospital.

## 2022-10-28 NOTE — Telephone Encounter (Signed)
I see he was admitted yesterday

## 2022-10-28 NOTE — ED Notes (Signed)
Pt in MRI.

## 2022-10-28 NOTE — H&P (Signed)
History and Physical    Patient: [name] [name]. [id number] DOB: Jun 28, 1946 DOA: 10/27/2022 DOS: the patient was seen and examined on 10/28/2022 PCP: Laurey Morale, MD  Patient coming from: Home  Chief Complaint:  Chief Complaint  Patient presents with   Fall   HPI: [name] [name]. is a 77 y.o. 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  Patient presented to the ER after report of falling x 3 which is a new issue.  In further discussion with the patient as well as his wife history clarified as follows: On Sunday night he had been having some back pain and fell in the back pain was radiating down his left lower extremity without any numbness or tingling.  The following day on Monday he went to see his PCP who felt like he had sciatica and ordered Robaxin for symptom management.  The patient did not start this medication until Tuesday and took his last dose on Wednesday.  Thursday he fell again.  After starting the medicine he did feel unsteady on his feet and when he fell he wanted to go to the left and as he describes it "my left leg did not move as expected.  I went 1 way and my leg went the other".  Subsequently he has not taken any more Robaxin and his unsteadiness and symptoms in his right leg have improved.  On my exam patient had equal strength in his lower extremities.  He had retained sensation.  And was able to perform bilateral straight leg raise without any back pain or any radicular symptoms down the left lower extremity.  Patient was initially evaluated at [facility] and there was concern for possible stroke or TIA.  CT of the head was unremarkable.  MRI of the brain was unremarkable except for some small vessel disease involving the distal right PCA in the M2 M3 regions.  Again patient has not had any dizziness or visual changes.  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  Of note neurology recommending getting an MRI of the lumbar spine which did reveal L4-5 left paracentral extrusion with upward migration and prominent left L4 and 5 impingement at the same level he was noted to have severe facet osteoarthritis with anterolisthesis and advanced spinal stenosis.  Labs were otherwise unremarkable other than some mild hyperglycemia and stable CKD 3A.  Vital signs were stable and patient was normotensive and not hypoxic.  The EDP also reproduced a normal neurological exam including lower extremities.  ED has consulted the hospitalist for consideration for admission and the patient has subsequently been transferred from [facility] to [facility] room [phone].  Review of Systems: As mentioned in the history of present illness. All other systems reviewed and are negative. Past Medical History:  Diagnosis Date   Arthritis    Cataract    Bil/ right eye surgery   Diabetes mellitus    type II   Diverticulitis of colon    ED (erectile dysfunction)    Gout    no meds   H/O echocardiogram 2013   normal    History of irregular heartbeat    Per pt, heart skips beats at times/had Echo   Hyperlipidemia    Hypertension    Iritis, recurrent     sees Dr. Silvestre Gunner at [facility]  Normal cardiac stress test 09/27/2002   Palpitations    PVCs and PACs per event monitor   Uveitis    sees Dr Polly Cobia at [facility] Dr Manuella Ghazi now   Past Surgical History:  Procedure Laterality Date   APPENDECTOMY     CATARACT EXTRACTION     Dr Katy Fitch , left eye/ 2 years ago   CATARACT EXTRACTION  03/16/2017   Right eye   COLONOSCOPY  04/20/2017   per Dr. Loletha Carrow, adenomatous polyps, repeat in 5 yrs    GANGLION CYST EXCISION Right 11/29/2021   Procedure: RIGHT REMOVAL GANGLION OF WRIST;  Surgeon: Sherilyn Cooter, MD;  Location: [place];  Service: Orthopedics;  Laterality: Right;   SHOULDER ARTHROSCOPY     both shoulders     TONSILLECTOMY     TONSILLECTOMY AND ADENOIDECTOMY     tubes in ears     55 years ago   Social History:  reports that he quit smoking about 52 years ago. His smoking use included cigarettes. He has a 8.00 pack-year smoking history. He has never used smokeless tobacco. He reports that he does not currently use alcohol after a past usage of about 7.0 standard drinks of alcohol per week. He reports that he does not use drugs.  Allergies  Allergen Reactions   Contrast Media [Iodinated Contrast Media] Hives and Itching    Hives and itching after IV contrast injection   Red Dye     hives   Fluorescein Other (See Comments)   Ioxaglate Itching and Hives    Hives and itching after IV contrast injection    Family History  Problem Relation Age of Onset   Thyroid cancer Mother    Heart disease Mother    Heart attack Father 63   Kidney disease Father    Heart disease Father    Thyroid cancer Brother    Heart attack Paternal Grandmother    Diabetes Paternal Grandfather    Stomach cancer Neg Hx    Colon polyps Neg Hx    Esophageal cancer Neg Hx    Rectal cancer Neg Hx     Prior to Admission medications   Medication Sig Start Date End Date Taking? Authorizing Provider  allopurinol (ZYLOPRIM) 100 MG tablet TAKE 1 TABLET(100 MG) BY MOUTH DAILY 09/19/22   Laurey Morale, MD  brimonidine (ALPHAGAN) 0.2 % ophthalmic solution Place 1 drop into both eyes as needed. Used 1 or 2 a year 08/13/19   [provider]  calcium-vitamin D (OSCAL WITH D) 500-200 MG-UNIT per tablet Take 2 tablets by mouth daily.    [provider]  colchicine 0.6 MG tablet Take 1 tablet (0.6 mg total) by mouth every 6 (six) hours as needed (gout). 06/17/21   Laurey Morale, MD  CONTOUR NEXT TEST test strip TEST BLOOD SUGAR THREE TO FOUR TIMES DAILY 08/11/21   Laurey Morale, MD  fluorouracil (EFUDEX) 5 % cream Apply topically. 06/21/22   [provider]  FOLIC ACID PO Take 10 mg by mouth daily.      [provider]  gabapentin (NEURONTIN) 300 MG capsule TAKE 1 CAPSULE(300 MG) BY MOUTH TWICE DAILY 08/08/22   Laurey Morale, MD  glipiZIDE (GLUCOTROL) 10 MG tablet Take 1 tablet (10 mg total) by mouth 2 (two) times daily before a meal. 05/31/22   Laurey Morale, MD  HUMIRA PEN 40 MG/0.4ML PNKT SMARTSIG:40 Milligram(s) SUB-Q Every 2 Weeks 06/06/22   [provider]  lisinopril (  ZESTRIL) 10 MG tablet Take 1 tablet (10 mg total) by mouth daily. 05/30/22   Laurey Morale, MD  methocarbamol (ROBAXIN) 500 MG tablet Take 1-2 tablets (500-1,000 mg total) by mouth 2 (two) times daily. 10/25/22   Fatima Blank, MD  methotrexate (RHEUMATREX) 2.5 MG tablet Take 4 tablets by mouth once a week. 01/07/20   [provider]  methylPREDNISolone (MEDROL DOSEPAK) 4 MG TBPK tablet As directed 10/25/22   Laurey Morale, MD  Microlet Lancets MISC TEST DAILY 02/15/22   Laurey Morale, MD  Multiple Vitamin (MULTIVITAMIN) tablet Take 1 tablet by mouth daily.    [provider]  pioglitazone (ACTOS) 30 MG tablet Take 1 tablet (30 mg total) by mouth daily. 08/29/22   Laurey Morale, MD  rosuvastatin (CRESTOR) 20 MG tablet TAKE 1 TABLET BY MOUTH DAILY 09/05/22   Laurey Morale, MD  timolol (TIMOPTIC) 0.5 % ophthalmic solution 1 drop 2 (two) times daily. 06/05/22   [provider]  traMADol (ULTRAM) 50 MG tablet TAKE 2 TABLETS(100 MG) BY MOUTH EVERY 6 HOURS AS NEEDED FOR SEVERE PAIN 05/18/22   Laurey Morale, MD  zolpidem (AMBIEN) 10 MG tablet TAKE 1 TABLET(10 MG) BY MOUTH AT BEDTIME 09/02/22   Laurey Morale, MD    Physical Exam: Vitals:   10/28/22 0600 10/28/22 0758 10/28/22 0800 10/28/22 0836  BP: 105/68 (!) 148/89 (!) 143/84 129/66  Pulse:  92 97 91  Resp: 13 15 (!) 21 20  Temp:      TempSrc:      SpO2:  98% 98% 100%  Weight:      Height:       Constitutional: NAD, calm, comfortable Respiratory: clear to auscultation bilaterally, no wheezing, no crackles. Normal  respiratory effort. No accessory muscle use.  Cardiovascular: Regular rate and rhythm, no murmurs / rubs / gallops. No extremity edema. 2+ pedal pulses. No carotid bruits.  Abdomen: no tenderness, no masses palpated. No hepatosplenomegaly. Bowel sounds positive.  Musculoskeletal: no clubbing / cyanosis. No joint deformity upper and lower extremities. Good ROM, no contractures. Normal muscle tone.  Skin: no rashes, lesions, ulcers. No induration Neurologic: CN 2-12 grossly intact. Sensation intact, DTR normal. Strength 5/5 x all 4 extremities.  Psychiatric: Normal judgment and insight. Alert and oriented x 3. Normal mood.   Data Reviewed:  Sodium 132 in context of elevated glucose 236, potassium 4.3, CO2 24, anion gap 10, BUN 47 and creatinine 1.67, white count 13,300, hemoglobin 14.9, hematocrit 43.2, platelets 198,000, CT of head as well as MRI head and MRI lumbar spine as noted above.  Assessment and Plan: Fall with MRI lumbar spine concerning for symptomatic lumbar disc disease Currently patient without any pain or other radicular symptoms including weakness, numbness or tingling.  Neurological exam unremarkable Have consulted neurosurgical team.  Anticipate additional workup and potential treatment can be accomplished in the outpatient setting  Concern for possible TIA Discussed imaging, other workup and current physical exam with neurologist Dr. Leonel Ramsay.  He agrees that current presentation not consistent with TIA or stroke and that patient's issue with falling and leg weakness that has been transient is related to lumbar disc disease and agrees with neurosurgical consultation Given small vessel intracranial disease he does recommend starting low-dose aspirin 81 mg daily as preventative therapy given patient's risk factors He recommends continuing Crestor at current dose as preventative therapy as well  Diabetes mellitus 2 on oral agents with mild hyperglycemia Presenting with mild  hyperglycemia  and pseudohyponatremia Resume home Glucotrol and Actos Follow CBGs and provide SSI Hemoglobin A1c in December was 7.3-go ahead and repeat this admission since it has been nearly 3 months since last checked  Hypertension Resume home medications lisinopril 10 mg daily  CKD 3a Current BUN and creatinine 47 and 1.67, BUN and creatinine September 2023 32/1.36 PCP discontinued Mobic due to concerns over renal function Discussed with attending and okay to start low-dose aspirin as stroke prophylaxis  Uveitis on chronic immunosuppressant agents Continue Rheumatrex and Humira as prior to admission     Advance Care Planning:   Code Status: Full Code   Consults: Neurology, neurosurgery  Family Communication: Wife at bedside  Severity of Illness: The appropriate patient status for this patient is OBSERVATION. Observation status is judged to be reasonable and necessary in order to provide the required intensity of service to ensure the patient's safety. The patient's presenting symptoms, physical exam findings, and initial radiographic and laboratory data in the context of their medical condition is felt to place them at decreased risk for further clinical deterioration. Furthermore, it is anticipated that the patient will be medically stable for discharge from the hospital within 2 midnights of admission.   Author: Erin Hearing, NP 10/28/2022 9:38 AM  For on call review [email or web address].

## 2022-10-28 NOTE — Progress Notes (Signed)
Patient refusing bed alarm. Wife at bedside. Will not place floor mats due to patient getting up independently and I'm worried he might trip over the mats.

## 2022-10-28 NOTE — Plan of Care (Signed)
VeRN has reviewed AVS with pt and his wife. VeRN answered their question and gave supportive education. The pt and his wife has no more questions and they have stated they understand the discharge instructions and education.  Problem: Education: Goal: Knowledge of General Education information will improve Description: Including pain rating scale, medication(s)/side effects and non-pharmacologic comfort measures Outcome: Adequate for Discharge   Problem: Health Behavior/Discharge Planning: Goal: Ability to manage health-related needs will improve Outcome: Adequate for Discharge   Problem: Clinical Measurements: Goal: Ability to maintain clinical measurements within normal limits will improve Outcome: Adequate for Discharge Goal: Will remain free from infection Outcome: Adequate for Discharge Goal: Diagnostic test results will improve Outcome: Adequate for Discharge Goal: Respiratory complications will improve Outcome: Adequate for Discharge Goal: Cardiovascular complication will be avoided Outcome: Adequate for Discharge   Problem: Activity: Goal: Risk for activity intolerance will decrease Outcome: Adequate for Discharge   Problem: Nutrition: Goal: Adequate nutrition will be maintained Outcome: Adequate for Discharge   Problem: Coping: Goal: Level of anxiety will decrease Outcome: Adequate for Discharge   Problem: Elimination: Goal: Will not experience complications related to bowel motility Outcome: Adequate for Discharge Goal: Will not experience complications related to urinary retention Outcome: Adequate for Discharge   Problem: Pain Managment: Goal: General experience of comfort will improve Outcome: Adequate for Discharge   Problem: Safety: Goal: Ability to remain free from injury will improve Outcome: Adequate for Discharge   Problem: Skin Integrity: Goal: Risk for impaired skin integrity will decrease Outcome: Adequate for Discharge   Problem:  Education: Goal: Knowledge of disease or condition will improve Outcome: Adequate for Discharge Goal: Knowledge of secondary prevention will improve (MUST DOCUMENT ALL) Outcome: Adequate for Discharge Goal: Knowledge of patient specific risk factors will improve Elta [place] N/A or DELETE if not current risk factor) Outcome: Adequate for Discharge   Problem: Ischemic Stroke/TIA Tissue Perfusion: Goal: Complications of ischemic stroke/TIA will be minimized Outcome: Adequate for Discharge   Problem: Coping: Goal: Will verbalize positive feelings about self Outcome: Adequate for Discharge Goal: Will identify appropriate support needs Outcome: Adequate for Discharge   Problem: Health Behavior/Discharge Planning: Goal: Ability to manage health-related needs will improve Outcome: Adequate for Discharge Goal: Goals will be collaboratively established with patient/family Outcome: Adequate for Discharge   Problem: Self-Care: Goal: Ability to participate in self-care as condition permits will improve Outcome: Adequate for Discharge Goal: Verbalization of feelings and concerns over difficulty with self-care will improve Outcome: Adequate for Discharge Goal: Ability to communicate needs accurately will improve Outcome: Adequate for Discharge   Problem: Nutrition: Goal: Risk of aspiration will decrease Outcome: Adequate for Discharge Goal: Dietary intake will improve Outcome: Adequate for Discharge   Problem: Education: Goal: Ability to describe self-care measures that may prevent or decrease complications (Diabetes Survival Skills Education) will improve Outcome: Adequate for Discharge Goal: Individualized Educational Video(s) Outcome: Adequate for Discharge   Problem: Coping: Goal: Ability to adjust to condition or change in health will improve Outcome: Adequate for Discharge   Problem: Fluid Volume: Goal: Ability to maintain a balanced intake and output will improve Outcome: Adequate  for Discharge   Problem: Health Behavior/Discharge Planning: Goal: Ability to identify and utilize available resources and services will improve Outcome: Adequate for Discharge Goal: Ability to manage health-related needs will improve Outcome: Adequate for Discharge   Problem: Metabolic: Goal: Ability to maintain appropriate glucose levels will improve Outcome: Adequate for Discharge   Problem: Nutritional: Goal: Maintenance of adequate nutrition will improve Outcome: Adequate for  Discharge Goal: Progress toward achieving an optimal weight will improve Outcome: Adequate for Discharge   Problem: Skin Integrity: Goal: Risk for impaired skin integrity will decrease Outcome: Adequate for Discharge   Problem: Tissue Perfusion: Goal: Adequacy of tissue perfusion will improve Outcome: Adequate for Discharge

## 2022-10-28 NOTE — Consult Note (Signed)
Reason for Consult:hnp l4/5, intercavernous carotid aneurysm Referring Physician: Townsend [name]. is an 77 y.o. male.  HPI: who this past week has had severe pain and weakness in the left buttocks, and his lower extremity. On the day of admission he had fallen x3 with his left lower extremity giving out. Brought to the ED by his wife, mri  lumbar spine performed revealing grade l spondylolisthesis, severe spinal stenosis, left displaced disc, and facet arthropathy at L4/5. Also  noted on MRA to have likely left cavernous carotid artery aneurysm. I was called for recommendations.   Past Medical History:  Diagnosis Date   Arthritis    Cataract    Bil/ right eye surgery   Diabetes mellitus    type II   Diverticulitis of colon    ED (erectile dysfunction)    Gout    no meds   H/O echocardiogram 2013   normal    History of irregular heartbeat    Per pt, heart skips beats at times/had Echo   Hyperlipidemia    Hypertension    Iritis, recurrent     sees Dr. Silvestre Gunner at [facility]    Normal cardiac stress test 09/27/2002   Palpitations    PVCs and PACs per event monitor   Uveitis    sees Dr Polly Cobia at [facility] Dr Manuella Ghazi now    Past Surgical History:  Procedure Laterality Date   APPENDECTOMY     CATARACT EXTRACTION     Dr Katy Fitch , left eye/ 2 years ago   CATARACT EXTRACTION  03/16/2017   Right eye   COLONOSCOPY  04/20/2017   per Dr. Loletha Carrow, adenomatous polyps, repeat in 5 yrs    GANGLION CYST EXCISION Right 11/29/2021   Procedure: RIGHT REMOVAL GANGLION OF WRIST;  Surgeon: Sherilyn Cooter, MD;  Location: [place];  Service: Orthopedics;  Laterality: Right;   SHOULDER ARTHROSCOPY     both shoulders    TONSILLECTOMY     TONSILLECTOMY AND ADENOIDECTOMY     tubes in ears     55 years ago    Family History  Problem Relation Age of Onset   Thyroid cancer Mother    Heart disease Mother    Heart attack Father 72   Kidney disease Father     Heart disease Father    Thyroid cancer Brother    Heart attack Paternal Grandmother    Diabetes Paternal Grandfather    Stomach cancer Neg Hx    Colon polyps Neg Hx    Esophageal cancer Neg Hx    Rectal cancer Neg Hx     Social History:  reports that he quit smoking about 52 years ago. His smoking use included cigarettes. He has a 8.00 pack-year smoking history. He has never used smokeless tobacco. He reports that he does not currently use alcohol after a past usage of about 7.0 standard drinks of alcohol per week. He reports that he does not use drugs.  Allergies:  Allergies  Allergen Reactions   Contrast Media [Iodinated Contrast Media] Hives and Itching    Hives and itching after IV contrast injection   Red Dye     hives   Fluorescein Other (See Comments)   Ioxaglate Itching and Hives    Hives and itching after IV contrast injection    Medications: I have reviewed the patient's current medications.  Results for orders placed or performed during the hospital encounter of 10/27/22 (from the past 48 hour(s))  CBC     Status: Abnormal   Collection Time: 10/27/22  7:16 PM  Result Value Ref Range   WBC 13.3 (H) 4.0 - 10.5 K/uL   RBC 4.58 4.22 - 5.81 MIL/uL   Hemoglobin 14.9 13.0 - 17.0 g/dL   HCT 43.2 39.0 - 52.0 %   MCV 94.3 80.0 - 100.0 fL   MCH 32.5 26.0 - 34.0 pg   MCHC 34.5 30.0 - 36.0 g/dL   RDW 13.6 11.5 - 15.5 %   Platelets 198 150 - 400 K/uL   nRBC 0.0 0.0 - 0.2 %    Comment: Performed at [facility], [address] [place], [place] [id number]  Basic metabolic panel     Status: Abnormal   Collection Time: 10/27/22  7:16 PM  Result Value Ref Range   Sodium 132 (L) 135 - 145 mmol/L   Potassium 4.3 3.5 - 5.1 mmol/L   Chloride 98 98 - 111 mmol/L   CO2 24 22 - 32 mmol/L   Glucose, Bld 236 (H) 70 - 99 mg/dL    Comment: Glucose reference range applies only to samples taken after fasting for at least 8 hours.   BUN 47 (H) 8 - 23 mg/dL    Creatinine, Ser 1.67 (H) 0.61 - 1.24 mg/dL   Calcium 9.9 8.9 - 10.3 mg/dL   GFR, Estimated 42 (L) >60 mL/min    Comment: (NOTE) Calculated using the CKD-EPI Creatinine Equation (2021)    Anion gap 10 5 - 15    Comment: Performed at [facility], [address], [place], [place] [address]  CBC     Status: None   Collection Time: 10/28/22 10:02 AM  Result Value Ref Range   WBC 8.9 4.0 - 10.5 K/uL   RBC 4.45 4.22 - 5.81 MIL/uL   Hemoglobin 14.4 13.0 - 17.0 g/dL   HCT 42.4 39.0 - 52.0 %   MCV 95.3 80.0 - 100.0 fL   MCH 32.4 26.0 - 34.0 pg   MCHC 34.0 30.0 - 36.0 g/dL   RDW 13.6 11.5 - 15.5 %   Platelets 165 150 - 400 K/uL   nRBC 0.0 0.0 - 0.2 %    Comment: Performed at [facility], [place] [address] [place], [place] [address]    MR BRAIN WO CONTRAST  Result Date: 10/28/2022 CLINICAL DATA:  Fall 3 times yesterday.  Neuro deficit. EXAM: MRI HEAD WITHOUT CONTRAST MRA HEAD WITHOUT CONTRAST MRA NECK WITHOUT AND WITH CONTRAST TECHNIQUE: Multiplanar, multi-echo pulse sequences of the brain and surrounding structures were acquired without intravenous contrast. Angiographic images of the Circle of Willis were acquired using MRA technique without intravenous contrast. Angiographic images of the neck were acquired using MRA technique without and with intravenous contrast. Carotid stenosis measurements (when applicable) are obtained utilizing NASCET criteria, using the distal internal carotid diameter as the denominator. CONTRAST:  8.31m GADAVIST GADOBUTROL 1 MMOL/ML IV SOLN COMPARISON:  CT head 1 day prior FINDINGS: MRI HEAD FINDINGS Brain: There is no acute intracranial hemorrhage, extra-axial fluid collection, or acute infarct. Parenchymal volume is normal. The ventricles are normal in size. Parenchymal signal is normal. There is no significant burden of underlying chronic small-vessel ischemic change The pituitary and suprasellar region are normal. There is no mass lesion.  There is no mass effect or midline shift. Vascular: See below. Skull and upper cervical spine: Normal marrow signal. Sinuses/Orbits: There is mild mucosal thickening in the paranasal sinuses. Bilateral lens implants are in place. The  globes and orbits are otherwise unremarkable. Other: None. MRA HEAD FINDINGS Anterior circulation: The intracranial ICAs are patent, without significant stenosis or occlusion. The bilateral M1 segments are patent. There is relatively diminutive enhancement of an inferior right M2/M3 branch in the sylvian fissure. There is no other proximal stenosis or occlusion. The bilateral ACAs are patent, without proximal stenosis or occlusion. The anterior communicating artery is normal. There is an apparent 4 mm superolaterally directed outpouching arising from the cavernous segment of the left ICA (key image, 1-115, 103-13). Posterior circulation: The bilateral V4 segments are patent. The basilar artery is patent. The major cerebellar arteries appear patent. There is a relatively diminutive enhancement of the distal right PCA branches which may reflect hemodynamically significant stenosis. The left PCA is patent. There is a fetal origin of the right PCA. There is no aneurysm or AVM. Anatomic variants: None. MRA NECK FINDINGS Aortic arch: The imaged aortic arch is normal. The origins of the major branch vessels are patent. The subclavian arteries are patent to the level imaged. Right carotid system: The right common, internal, and external carotid arteries are patent, without hemodynamically significant stenosis or occlusion. There is no dissection or aneurysm. Left carotid system: The left common, internal, and external carotid arteries are patent, without hemodynamically significant stenosis or occlusion. There is no evidence of dissection or aneurysm. Vertebral arteries: The vertebral arteries are patent with antegrade flow. There is no evidence of hemodynamically significant stenosis or  occlusion. There is no evidence of dissection or aneurysm. Other: None IMPRESSION: 1. Normal noncontrast brain MRI. 2. Relatively diminutive enhancement of the distal right PCA branches and an inferior right M2/M3 branch likely reflecting hemodynamically significant stenosis. Otherwise, patent intracranial vasculature. 3. Suspect 4 mm aneurysm arising from the left cavernous ICA. Consider CT head for better evaluation. This could be obtained on a nonemergent outpatient basis. 4. Patent vasculature of the neck with no hemodynamically significant stenosis, occlusion, or dissection. Electronically Signed   By: Valetta Mole M.D.   On: 10/28/2022 08:22   MR ANGIO NECK W WO CONTRAST  Result Date: 10/28/2022 CLINICAL DATA:  Fall 3 times yesterday.  Neuro deficit. EXAM: MRI HEAD WITHOUT CONTRAST MRA HEAD WITHOUT CONTRAST MRA NECK WITHOUT AND WITH CONTRAST TECHNIQUE: Multiplanar, multi-echo pulse sequences of the brain and surrounding structures were acquired without intravenous contrast. Angiographic images of the Circle of Willis were acquired using MRA technique without intravenous contrast. Angiographic images of the neck were acquired using MRA technique without and with intravenous contrast. Carotid stenosis measurements (when applicable) are obtained utilizing NASCET criteria, using the distal internal carotid diameter as the denominator. CONTRAST:  8.37m GADAVIST GADOBUTROL 1 MMOL/ML IV SOLN COMPARISON:  CT head 1 day prior FINDINGS: MRI HEAD FINDINGS Brain: There is no acute intracranial hemorrhage, extra-axial fluid collection, or acute infarct. Parenchymal volume is normal. The ventricles are normal in size. Parenchymal signal is normal. There is no significant burden of underlying chronic small-vessel ischemic change The pituitary and suprasellar region are normal. There is no mass lesion. There is no mass effect or midline shift. Vascular: See below. Skull and upper cervical spine: Normal marrow signal.  Sinuses/Orbits: There is mild mucosal thickening in the paranasal sinuses. Bilateral lens implants are in place. The globes and orbits are otherwise unremarkable. Other: None. MRA HEAD FINDINGS Anterior circulation: The intracranial ICAs are patent, without significant stenosis or occlusion. The bilateral M1 segments are patent. There is relatively diminutive enhancement of an inferior right M2/M3 branch in the sylvian fissure.  There is no other proximal stenosis or occlusion. The bilateral ACAs are patent, without proximal stenosis or occlusion. The anterior communicating artery is normal. There is an apparent 4 mm superolaterally directed outpouching arising from the cavernous segment of the left ICA (key image, 1-115, 103-13). Posterior circulation: The bilateral V4 segments are patent. The basilar artery is patent. The major cerebellar arteries appear patent. There is a relatively diminutive enhancement of the distal right PCA branches which may reflect hemodynamically significant stenosis. The left PCA is patent. There is a fetal origin of the right PCA. There is no aneurysm or AVM. Anatomic variants: None. MRA NECK FINDINGS Aortic arch: The imaged aortic arch is normal. The origins of the major branch vessels are patent. The subclavian arteries are patent to the level imaged. Right carotid system: The right common, internal, and external carotid arteries are patent, without hemodynamically significant stenosis or occlusion. There is no dissection or aneurysm. Left carotid system: The left common, internal, and external carotid arteries are patent, without hemodynamically significant stenosis or occlusion. There is no evidence of dissection or aneurysm. Vertebral arteries: The vertebral arteries are patent with antegrade flow. There is no evidence of hemodynamically significant stenosis or occlusion. There is no evidence of dissection or aneurysm. Other: None IMPRESSION: 1. Normal noncontrast brain MRI. 2.  Relatively diminutive enhancement of the distal right PCA branches and an inferior right M2/M3 branch likely reflecting hemodynamically significant stenosis. Otherwise, patent intracranial vasculature. 3. Suspect 4 mm aneurysm arising from the left cavernous ICA. Consider CT head for better evaluation. This could be obtained on a nonemergent outpatient basis. 4. Patent vasculature of the neck with no hemodynamically significant stenosis, occlusion, or dissection. Electronically Signed   By: Valetta Mole M.D.   On: 10/28/2022 08:22   MR ANGIO HEAD WO CONTRAST  Result Date: 10/28/2022 CLINICAL DATA:  Fall 3 times yesterday.  Neuro deficit. EXAM: MRI HEAD WITHOUT CONTRAST MRA HEAD WITHOUT CONTRAST MRA NECK WITHOUT AND WITH CONTRAST TECHNIQUE: Multiplanar, multi-echo pulse sequences of the brain and surrounding structures were acquired without intravenous contrast. Angiographic images of the Circle of Willis were acquired using MRA technique without intravenous contrast. Angiographic images of the neck were acquired using MRA technique without and with intravenous contrast. Carotid stenosis measurements (when applicable) are obtained utilizing NASCET criteria, using the distal internal carotid diameter as the denominator. CONTRAST:  8.83m GADAVIST GADOBUTROL 1 MMOL/ML IV SOLN COMPARISON:  CT head 1 day prior FINDINGS: MRI HEAD FINDINGS Brain: There is no acute intracranial hemorrhage, extra-axial fluid collection, or acute infarct. Parenchymal volume is normal. The ventricles are normal in size. Parenchymal signal is normal. There is no significant burden of underlying chronic small-vessel ischemic change The pituitary and suprasellar region are normal. There is no mass lesion. There is no mass effect or midline shift. Vascular: See below. Skull and upper cervical spine: Normal marrow signal. Sinuses/Orbits: There is mild mucosal thickening in the paranasal sinuses. Bilateral lens implants are in place. The globes  and orbits are otherwise unremarkable. Other: None. MRA HEAD FINDINGS Anterior circulation: The intracranial ICAs are patent, without significant stenosis or occlusion. The bilateral M1 segments are patent. There is relatively diminutive enhancement of an inferior right M2/M3 branch in the sylvian fissure. There is no other proximal stenosis or occlusion. The bilateral ACAs are patent, without proximal stenosis or occlusion. The anterior communicating artery is normal. There is an apparent 4 mm superolaterally directed outpouching arising from the cavernous segment of the left ICA (key image, 1-115,  103-13). Posterior circulation: The bilateral V4 segments are patent. The basilar artery is patent. The major cerebellar arteries appear patent. There is a relatively diminutive enhancement of the distal right PCA branches which may reflect hemodynamically significant stenosis. The left PCA is patent. There is a fetal origin of the right PCA. There is no aneurysm or AVM. Anatomic variants: None. MRA NECK FINDINGS Aortic arch: The imaged aortic arch is normal. The origins of the major branch vessels are patent. The subclavian arteries are patent to the level imaged. Right carotid system: The right common, internal, and external carotid arteries are patent, without hemodynamically significant stenosis or occlusion. There is no dissection or aneurysm. Left carotid system: The left common, internal, and external carotid arteries are patent, without hemodynamically significant stenosis or occlusion. There is no evidence of dissection or aneurysm. Vertebral arteries: The vertebral arteries are patent with antegrade flow. There is no evidence of hemodynamically significant stenosis or occlusion. There is no evidence of dissection or aneurysm. Other: None IMPRESSION: 1. Normal noncontrast brain MRI. 2. Relatively diminutive enhancement of the distal right PCA branches and an inferior right M2/M3 branch likely reflecting  hemodynamically significant stenosis. Otherwise, patent intracranial vasculature. 3. Suspect 4 mm aneurysm arising from the left cavernous ICA. Consider CT head for better evaluation. This could be obtained on a nonemergent outpatient basis. 4. Patent vasculature of the neck with no hemodynamically significant stenosis, occlusion, or dissection. Electronically Signed   By: Valetta Mole M.D.   On: 10/28/2022 08:22   MR LUMBAR SPINE WO CONTRAST  Result Date: 10/28/2022 CLINICAL DATA:  Fall 3 times yesterday. Left lower extremity weakness EXAM: MRI LUMBAR SPINE WITHOUT CONTRAST TECHNIQUE: Multiplanar, multisequence MR imaging of the lumbar spine was performed. No intravenous contrast was administered. COMPARISON:  05/07/2014 FINDINGS: Segmentation:  Standard. Alignment:  Degenerative anterolisthesis at L4-5. Vertebrae:  No fracture, evidence of discitis, or bone lesion. Conus medullaris and cauda equina: Conus extends to the T12-L1 level. Conus and cauda equina appear normal. Paraspinal and other soft tissues: Negative for perispinal mass or inflammation. Left colonic diverticulosis. Patulous bladder with trabeculation suggesting chronic outlet obstruction. Disc levels: T12- L1: Unremarkable. L1-L2: Disc narrowing and spondylitic spurring with tiny left paracentral herniation L2-L3: Disc narrowing and endplate degeneration. Mild endplate and facet spurring. L3-L4: Disc narrowing and bulging with endplate ridging. Degenerative facet spurring on both sides L4-L5: Degenerative facet spurring which is bulky with ligamentum flavum thickening and joint effusions. Associated anterolisthesis. Severe spinal stenosis. There is a left paracentral superiorly migrating disc extrusion since prior markedly compressing the descending left L4 and L5 nerve roots. L5-S1:Advanced disc degeneration with intervertebral ankylosis. No neural impingement. IMPRESSION: 1. L4-5 left paracentral extrusion with upward migration and prominent  left L4 and L5 impingement. At the same level there is severe facet osteoarthritis with anterolisthesis and advanced spinal stenosis. 2. Ankylosis has occurred at the L5-S1 disc space since prior. Electronically Signed   By: Jorje Guild M.D.   On: 10/28/2022 07:54   DG Hip Unilat W or Wo Pelvis 2-3 Views Left  Result Date: 10/27/2022 CLINICAL DATA:  Left hip pain EXAM: DG HIP (WITH OR WITHOUT PELVIS) 2V LEFT COMPARISON:  None Available. FINDINGS: There is no evidence of hip fracture or dislocation. There is no evidence of arthropathy or other focal bone abnormality. IMPRESSION: Negative. Electronically Signed   By: Sammie Bench M.D.   On: 10/27/2022 22:21   CT Head Wo Contrast  Result Date: 10/27/2022 CLINICAL DATA:  Multiple falls. EXAM: CT  HEAD WITHOUT CONTRAST TECHNIQUE: Contiguous axial images were obtained from the base of the skull through the vertex without intravenous contrast. RADIATION DOSE REDUCTION: This exam was performed according to the departmental dose-optimization program which includes automated exposure control, adjustment of the mA and/or kV according to patient size and/or use of iterative reconstruction technique. COMPARISON:  None Available. FINDINGS: Brain: There is mild cerebral atrophy with widening of the extra-axial spaces and ventricular dilatation. There are areas of decreased attenuation within the white matter tracts of the supratentorial brain, consistent with microvascular disease changes. A 4 mm cortical calcification is seen within the left occipital lobe. Vascular: Marked severity bilateral cavernous carotid artery calcification is noted. Skull: Normal. Negative for fracture or focal lesion. Sinuses/Orbits: No acute finding. Other: None. IMPRESSION: 1. No acute intracranial abnormality. 2. Cerebral atrophy with chronic white matter small vessel ischemic changes. Electronically Signed   By: Virgina Norfolk M.D.   On: 10/27/2022 21:12    Review of Systems   Constitutional:  Positive for activity change.  HENT: Negative.    Eyes: Negative.   Respiratory: Negative.    Cardiovascular: Negative.   Gastrointestinal:  Positive for constipation.  Endocrine: Negative.   Genitourinary: Negative.   Musculoskeletal:  Positive for back pain.  Skin: Negative.   Allergic/Immunologic: Negative.   Neurological:  Positive for weakness.  Hematological: Negative.   Psychiatric/Behavioral: Negative.     Blood pressure 129/66, pulse 91, temperature 98.4 F (36.9 C), temperature source Oral, resp. rate 20, height 6' 1"$  (1.854 m), weight 82.1 kg, SpO2 100 %. Physical Exam Constitutional:      General: He is not in acute distress.    Appearance: Normal appearance. He is normal weight.  HENT:     Head: Normocephalic and atraumatic.     Right Ear: External ear normal.     Left Ear: External ear normal.     Nose: Nose normal.     Mouth/Throat:     Mouth: Mucous membranes are moist.     Pharynx: Oropharynx is clear.  Eyes:     Extraocular Movements: Extraocular movements intact.     Pupils: Pupils are equal, round, and reactive to light.  Cardiovascular:     Rate and Rhythm: Normal rate and regular rhythm.  Pulmonary:     Effort: Pulmonary effort is normal.  Abdominal:     General: Abdomen is flat.  Musculoskeletal:        General: Normal range of motion.     Cervical back: Normal range of motion.  Skin:    General: Skin is warm and dry.  Neurological:     General: No focal deficit present.     Mental Status: He is alert and oriented to person, place, and time.     Cranial Nerves: Cranial nerves 2-12 are intact.     Sensory: Sensation is intact.     Motor: Motor function is intact.     Coordination: Coordination is intact.     Deep Tendon Reflexes: Babinski sign absent on the right side. Babinski sign absent on the left side.     Assessment/Plan: [name] [name]. is a 77 y.o. male With a large herniated disc at L4/5 on the left. This  explains all of his presenting symptoms. I recommend operative decompression and fusion. At this time he feels well. He can certainly be discharged home to follow up in my office. This is not an emergent neurological crisis. The aneurysm also has no indication of intervention at this  time. I instructed him to finish the steroid dosepak which he was prescribed.   [name] [name] 10/28/2022, 11:12 AM

## 2022-10-31 DIAGNOSIS — H43813 Vitreous degeneration, bilateral: Secondary | ICD-10-CM | POA: Diagnosis not present

## 2022-10-31 DIAGNOSIS — H401123 Primary open-angle glaucoma, left eye, severe stage: Secondary | ICD-10-CM | POA: Diagnosis not present

## 2022-10-31 DIAGNOSIS — H20021 Recurrent acute iridocyclitis, right eye: Secondary | ICD-10-CM | POA: Diagnosis not present

## 2022-10-31 DIAGNOSIS — H401112 Primary open-angle glaucoma, right eye, moderate stage: Secondary | ICD-10-CM | POA: Diagnosis not present

## 2022-11-01 DIAGNOSIS — M4316 Spondylolisthesis, lumbar region: Secondary | ICD-10-CM | POA: Diagnosis not present

## 2022-11-07 ENCOUNTER — Ambulatory Visit: Payer: BC Managed Care – PPO | Admitting: Family Medicine

## 2022-11-08 ENCOUNTER — Other Ambulatory Visit: Payer: Self-pay | Admitting: Family Medicine

## 2022-11-18 DIAGNOSIS — M545 Low back pain, unspecified: Secondary | ICD-10-CM | POA: Diagnosis not present

## 2022-11-23 ENCOUNTER — Ambulatory Visit (INDEPENDENT_AMBULATORY_CARE_PROVIDER_SITE_OTHER): Payer: BC Managed Care – PPO | Admitting: Family Medicine

## 2022-11-23 ENCOUNTER — Encounter: Payer: Self-pay | Admitting: Family Medicine

## 2022-11-23 VITALS — BP 110/78 | HR 60 | Temp 97.5°F | Wt 185.0 lb

## 2022-11-23 DIAGNOSIS — E119 Type 2 diabetes mellitus without complications: Secondary | ICD-10-CM

## 2022-11-23 DIAGNOSIS — M5416 Radiculopathy, lumbar region: Secondary | ICD-10-CM

## 2022-11-23 DIAGNOSIS — I10 Essential (primary) hypertension: Secondary | ICD-10-CM

## 2022-11-23 LAB — POCT GLYCOSYLATED HEMOGLOBIN (HGB A1C): Hemoglobin A1C: 7.2 % — AB (ref 4.0–5.6)

## 2022-11-23 MED ORDER — PIOGLITAZONE HCL 30 MG PO TABS: 45.0000 mg | ORAL_TABLET | Freq: Every day | ORAL | 3 refills | Status: DC

## 2022-11-23 NOTE — Progress Notes (Signed)
   Subjective:    Patient ID: [name] [name]., male    DOB: 03-25-1946, 77 y.o.   MRN: [id number]  HPI Here to follow up a hospital stay from 10-27-22 to 10-28-22 and to follow his diabetes. At our last visit 3 months ago his A1c was 7.3%,and we added Pioglitazone 30 mg daily to his regimen. He says his average glucose at has been 132. His A1c today is 7.2%. He feels well. When he was admitted to the hospital last month he had fallen twice after he says his left leg suddenly buckled beneath him. He denies any sort of back pain. He was ruled out for a stroke with a negative brain MRI and a neck MRA. An MRI of the lumbar spine showed a herniated disc at L4-5 which was putting pressure on the left nerve root. He was sent back home, and he saw Dr. Dayton Bailiff (neurosurgery) on 11-01-22. He felt that surgery was not indicated, so now Al has been getting PT with [facility]. They are working on increasing his core strength. He has had no further falls.    Review of Systems  Constitutional: Negative.   Respiratory: Negative.    Cardiovascular: Negative.   Musculoskeletal: Negative.   Neurological: Negative.        Objective:   Physical Exam Constitutional:      Appearance: Normal appearance. He is not ill-appearing.  Cardiovascular:     Rate and Rhythm: Normal rate and regular rhythm.     Pulses: Normal pulses.     Heart sounds: Normal heart sounds.  Pulmonary:     Effort: Pulmonary effort is normal.     Breath sounds: Normal breath sounds.  Neurological:     General: No focal deficit present.     Mental Status: He is alert and oriented to person, place, and time.     Gait: Gait normal.           Assessment & Plan:  He has a left lumbar radiculopathy which presents as weakness rather than pain. He will continue with PT as above. His HTN is stable. For the diabetes, we will increase the Pioglitazone to 45 mg daily. Recheck an A1c in 90 days. We spent a total of ( 35  ) minutes  reviewing records and discussing these issues.  Alysia Penna, MD

## 2022-11-24 DIAGNOSIS — M50322 Other cervical disc degeneration at C5-C6 level: Secondary | ICD-10-CM | POA: Diagnosis not present

## 2022-11-24 DIAGNOSIS — M50323 Other cervical disc degeneration at C6-C7 level: Secondary | ICD-10-CM | POA: Diagnosis not present

## 2022-11-24 DIAGNOSIS — M9901 Segmental and somatic dysfunction of cervical region: Secondary | ICD-10-CM | POA: Diagnosis not present

## 2022-11-24 DIAGNOSIS — M50321 Other cervical disc degeneration at C4-C5 level: Secondary | ICD-10-CM | POA: Diagnosis not present

## 2022-11-30 DIAGNOSIS — H7392 Unspecified disorder of tympanic membrane, left ear: Secondary | ICD-10-CM | POA: Diagnosis not present

## 2022-11-30 DIAGNOSIS — H903 Sensorineural hearing loss, bilateral: Secondary | ICD-10-CM | POA: Diagnosis not present

## 2022-11-30 DIAGNOSIS — H6123 Impacted cerumen, bilateral: Secondary | ICD-10-CM | POA: Diagnosis not present

## 2022-11-30 DIAGNOSIS — M545 Low back pain, unspecified: Secondary | ICD-10-CM | POA: Diagnosis not present

## 2022-11-30 DIAGNOSIS — Z01118 Encounter for examination of ears and hearing with other abnormal findings: Secondary | ICD-10-CM | POA: Diagnosis not present

## 2022-12-15 ENCOUNTER — Other Ambulatory Visit: Payer: Self-pay | Admitting: Family Medicine

## 2022-12-20 DIAGNOSIS — D1801 Hemangioma of skin and subcutaneous tissue: Secondary | ICD-10-CM | POA: Diagnosis not present

## 2022-12-20 DIAGNOSIS — L821 Other seborrheic keratosis: Secondary | ICD-10-CM | POA: Diagnosis not present

## 2022-12-20 DIAGNOSIS — L812 Freckles: Secondary | ICD-10-CM | POA: Diagnosis not present

## 2022-12-20 DIAGNOSIS — Z85828 Personal history of other malignant neoplasm of skin: Secondary | ICD-10-CM | POA: Diagnosis not present

## 2022-12-20 DIAGNOSIS — L570 Actinic keratosis: Secondary | ICD-10-CM | POA: Diagnosis not present

## 2022-12-21 DIAGNOSIS — M545 Low back pain, unspecified: Secondary | ICD-10-CM | POA: Diagnosis not present

## 2022-12-21 DIAGNOSIS — M25611 Stiffness of right shoulder, not elsewhere classified: Secondary | ICD-10-CM | POA: Diagnosis not present

## 2022-12-21 DIAGNOSIS — M25511 Pain in right shoulder: Secondary | ICD-10-CM | POA: Diagnosis not present

## 2022-12-23 NOTE — Progress Notes (Signed)
MRA ordered to evaluate for stroke in this patient along with its possible causes.

## 2022-12-25 ENCOUNTER — Other Ambulatory Visit: Payer: Self-pay | Admitting: Family Medicine

## 2022-12-27 DIAGNOSIS — M50322 Other cervical disc degeneration at C5-C6 level: Secondary | ICD-10-CM | POA: Diagnosis not present

## 2022-12-27 DIAGNOSIS — M50323 Other cervical disc degeneration at C6-C7 level: Secondary | ICD-10-CM | POA: Diagnosis not present

## 2022-12-27 DIAGNOSIS — M9901 Segmental and somatic dysfunction of cervical region: Secondary | ICD-10-CM | POA: Diagnosis not present

## 2022-12-27 DIAGNOSIS — M50321 Other cervical disc degeneration at C4-C5 level: Secondary | ICD-10-CM | POA: Diagnosis not present

## 2022-12-28 DIAGNOSIS — M25511 Pain in right shoulder: Secondary | ICD-10-CM | POA: Diagnosis not present

## 2022-12-28 DIAGNOSIS — M25611 Stiffness of right shoulder, not elsewhere classified: Secondary | ICD-10-CM | POA: Diagnosis not present

## 2022-12-28 DIAGNOSIS — M545 Low back pain, unspecified: Secondary | ICD-10-CM | POA: Diagnosis not present

## 2023-01-02 ENCOUNTER — Other Ambulatory Visit: Payer: Self-pay | Admitting: Family Medicine

## 2023-01-03 NOTE — Telephone Encounter (Signed)
Pt LOV was on 11/23/22 Last refill was done on 05/18/2022 Please advise

## 2023-01-04 DIAGNOSIS — M545 Low back pain, unspecified: Secondary | ICD-10-CM | POA: Diagnosis not present

## 2023-01-04 DIAGNOSIS — M25511 Pain in right shoulder: Secondary | ICD-10-CM | POA: Diagnosis not present

## 2023-01-04 DIAGNOSIS — M25611 Stiffness of right shoulder, not elsewhere classified: Secondary | ICD-10-CM | POA: Diagnosis not present

## 2023-01-11 DIAGNOSIS — M50321 Other cervical disc degeneration at C4-C5 level: Secondary | ICD-10-CM | POA: Diagnosis not present

## 2023-01-11 DIAGNOSIS — M5137 Other intervertebral disc degeneration, lumbosacral region: Secondary | ICD-10-CM | POA: Diagnosis not present

## 2023-01-11 DIAGNOSIS — M50322 Other cervical disc degeneration at C5-C6 level: Secondary | ICD-10-CM | POA: Diagnosis not present

## 2023-01-11 DIAGNOSIS — M9903 Segmental and somatic dysfunction of lumbar region: Secondary | ICD-10-CM | POA: Diagnosis not present

## 2023-01-17 DIAGNOSIS — M5137 Other intervertebral disc degeneration, lumbosacral region: Secondary | ICD-10-CM | POA: Diagnosis not present

## 2023-01-17 DIAGNOSIS — M50321 Other cervical disc degeneration at C4-C5 level: Secondary | ICD-10-CM | POA: Diagnosis not present

## 2023-01-17 DIAGNOSIS — M9903 Segmental and somatic dysfunction of lumbar region: Secondary | ICD-10-CM | POA: Diagnosis not present

## 2023-01-17 DIAGNOSIS — M50322 Other cervical disc degeneration at C5-C6 level: Secondary | ICD-10-CM | POA: Diagnosis not present

## 2023-01-18 DIAGNOSIS — M25611 Stiffness of right shoulder, not elsewhere classified: Secondary | ICD-10-CM | POA: Diagnosis not present

## 2023-01-18 DIAGNOSIS — M25511 Pain in right shoulder: Secondary | ICD-10-CM | POA: Diagnosis not present

## 2023-01-18 DIAGNOSIS — M545 Low back pain, unspecified: Secondary | ICD-10-CM | POA: Diagnosis not present

## 2023-01-19 ENCOUNTER — Other Ambulatory Visit: Payer: Self-pay

## 2023-01-19 ENCOUNTER — Encounter: Payer: Self-pay | Admitting: Family Medicine

## 2023-01-19 MED ORDER — PIOGLITAZONE HCL 30 MG PO TABS: 45.0000 mg | ORAL_TABLET | Freq: Every day | ORAL | 3 refills | Status: DC

## 2023-01-21 ENCOUNTER — Encounter: Payer: Self-pay | Admitting: Family Medicine

## 2023-01-23 ENCOUNTER — Other Ambulatory Visit: Payer: Self-pay

## 2023-01-24 DIAGNOSIS — M9903 Segmental and somatic dysfunction of lumbar region: Secondary | ICD-10-CM | POA: Diagnosis not present

## 2023-01-24 DIAGNOSIS — M25511 Pain in right shoulder: Secondary | ICD-10-CM | POA: Diagnosis not present

## 2023-01-24 DIAGNOSIS — M545 Low back pain, unspecified: Secondary | ICD-10-CM | POA: Diagnosis not present

## 2023-01-24 DIAGNOSIS — M5137 Other intervertebral disc degeneration, lumbosacral region: Secondary | ICD-10-CM | POA: Diagnosis not present

## 2023-01-24 DIAGNOSIS — M50321 Other cervical disc degeneration at C4-C5 level: Secondary | ICD-10-CM | POA: Diagnosis not present

## 2023-01-24 DIAGNOSIS — M25611 Stiffness of right shoulder, not elsewhere classified: Secondary | ICD-10-CM | POA: Diagnosis not present

## 2023-01-24 DIAGNOSIS — M50322 Other cervical disc degeneration at C5-C6 level: Secondary | ICD-10-CM | POA: Diagnosis not present

## 2023-01-26 DIAGNOSIS — M545 Low back pain, unspecified: Secondary | ICD-10-CM | POA: Diagnosis not present

## 2023-01-26 DIAGNOSIS — M25511 Pain in right shoulder: Secondary | ICD-10-CM | POA: Diagnosis not present

## 2023-01-26 DIAGNOSIS — M25611 Stiffness of right shoulder, not elsewhere classified: Secondary | ICD-10-CM | POA: Diagnosis not present

## 2023-01-28 ENCOUNTER — Other Ambulatory Visit: Payer: Self-pay | Admitting: Family Medicine

## 2023-01-31 DIAGNOSIS — M50322 Other cervical disc degeneration at C5-C6 level: Secondary | ICD-10-CM | POA: Diagnosis not present

## 2023-01-31 DIAGNOSIS — M50321 Other cervical disc degeneration at C4-C5 level: Secondary | ICD-10-CM | POA: Diagnosis not present

## 2023-01-31 DIAGNOSIS — M545 Low back pain, unspecified: Secondary | ICD-10-CM | POA: Diagnosis not present

## 2023-01-31 DIAGNOSIS — M25611 Stiffness of right shoulder, not elsewhere classified: Secondary | ICD-10-CM | POA: Diagnosis not present

## 2023-01-31 DIAGNOSIS — M25511 Pain in right shoulder: Secondary | ICD-10-CM | POA: Diagnosis not present

## 2023-01-31 DIAGNOSIS — M9903 Segmental and somatic dysfunction of lumbar region: Secondary | ICD-10-CM | POA: Diagnosis not present

## 2023-01-31 DIAGNOSIS — M5137 Other intervertebral disc degeneration, lumbosacral region: Secondary | ICD-10-CM | POA: Diagnosis not present

## 2023-02-08 DIAGNOSIS — M25611 Stiffness of right shoulder, not elsewhere classified: Secondary | ICD-10-CM | POA: Diagnosis not present

## 2023-02-08 DIAGNOSIS — M545 Low back pain, unspecified: Secondary | ICD-10-CM | POA: Diagnosis not present

## 2023-02-08 DIAGNOSIS — M25511 Pain in right shoulder: Secondary | ICD-10-CM | POA: Diagnosis not present

## 2023-02-09 ENCOUNTER — Encounter: Payer: Self-pay | Admitting: Family Medicine

## 2023-02-10 ENCOUNTER — Encounter: Payer: Self-pay | Admitting: Family Medicine

## 2023-02-13 ENCOUNTER — Ambulatory Visit (INDEPENDENT_AMBULATORY_CARE_PROVIDER_SITE_OTHER): Payer: BC Managed Care – PPO | Admitting: Family Medicine

## 2023-02-13 ENCOUNTER — Encounter: Payer: Self-pay | Admitting: Family Medicine

## 2023-02-13 VITALS — BP 118/70 | HR 56 | Wt 184.0 lb

## 2023-02-13 DIAGNOSIS — M25512 Pain in left shoulder: Secondary | ICD-10-CM | POA: Diagnosis not present

## 2023-02-13 DIAGNOSIS — G8929 Other chronic pain: Secondary | ICD-10-CM | POA: Insufficient documentation

## 2023-02-13 DIAGNOSIS — M25511 Pain in right shoulder: Secondary | ICD-10-CM

## 2023-02-13 NOTE — Progress Notes (Signed)
   Subjective:    Patient ID: [name] [name]., male    DOB: 02-Oct-1945, 77 y.o.   MRN: [id number]  HPI Here asking for help with shoulder pain. This involves both shoulders, but the left is the worst. He has been seeing Ronda Fairly for PT, but this has not helped. He takes Tramadol BID as needed.    Review of Systems  Constitutional: Negative.   Respiratory: Negative.    Cardiovascular: Negative.   Musculoskeletal:  Positive for arthralgias.       Objective:   Physical Exam Constitutional:      Appearance: Normal appearance.  Cardiovascular:     Rate and Rhythm: Normal rate and regular rhythm.     Pulses: Normal pulses.     Heart sounds: Normal heart sounds.  Pulmonary:     Effort: Pulmonary effort is normal.     Breath sounds: Normal breath sounds.  Neurological:     Mental Status: He is alert.           Assessment & Plan:  Shoulder pain. We will refer him to Orthopedics to evaluate.  Gershon Crane, MD

## 2023-02-13 NOTE — Telephone Encounter (Signed)
Pt has appointment with Dr Clent Ridges this afternoon, will take care of this at his visit

## 2023-02-15 DIAGNOSIS — Z461 Encounter for fitting and adjustment of hearing aid: Secondary | ICD-10-CM | POA: Diagnosis not present

## 2023-02-15 DIAGNOSIS — H903 Sensorineural hearing loss, bilateral: Secondary | ICD-10-CM | POA: Diagnosis not present

## 2023-02-15 DIAGNOSIS — Z974 Presence of external hearing-aid: Secondary | ICD-10-CM | POA: Diagnosis not present

## 2023-02-23 ENCOUNTER — Encounter: Payer: Self-pay | Admitting: Family Medicine

## 2023-02-23 ENCOUNTER — Ambulatory Visit (INDEPENDENT_AMBULATORY_CARE_PROVIDER_SITE_OTHER): Payer: BC Managed Care – PPO | Admitting: Family Medicine

## 2023-02-23 VITALS — BP 118/70 | HR 61 | Temp 97.5°F | Wt 180.0 lb

## 2023-02-23 DIAGNOSIS — M5431 Sciatica, right side: Secondary | ICD-10-CM

## 2023-02-23 DIAGNOSIS — E119 Type 2 diabetes mellitus without complications: Secondary | ICD-10-CM | POA: Diagnosis not present

## 2023-02-23 LAB — POCT GLYCOSYLATED HEMOGLOBIN (HGB A1C): Hemoglobin A1C: 6.3 % — AB (ref 4.0–5.6)

## 2023-02-23 MED ORDER — HYDROCODONE-ACETAMINOPHEN 5-325 MG PO TABS: 1.0000 | ORAL_TABLET | ORAL | 0 refills | Status: DC | PRN

## 2023-02-23 MED ORDER — METHYLPREDNISOLONE 4 MG PO TBPK: ORAL_TABLET | ORAL | 0 refills | Status: DC

## 2023-02-23 NOTE — Progress Notes (Signed)
   Subjective:    Patient ID: [name] Jaksch., male    DOB: July 13, 1946, 77 y.o.   MRN: [id number]  HPI Here for 4 days of severe pain from the right lower back down the right leg to the foot. No recent trauma. No weakness or numbness. He had an MRI of the lumbar spine in February that showed severe facet arthropathy and advanced spinal stenosis at the L4-5 level. He has been taking Tramadol with no relief.    Review of Systems  Constitutional: Negative.   Respiratory: Negative.    Cardiovascular: Negative.   Musculoskeletal:  Positive for back pain.       Objective:   Physical Exam Constitutional:      Comments: Walks with a slight limp   Cardiovascular:     Rate and Rhythm: Normal rate and regular rhythm.     Pulses: Normal pulses.     Heart sounds: Normal heart sounds.  Pulmonary:     Effort: Pulmonary effort is normal.     Breath sounds: Normal breath sounds.  Musculoskeletal:     Comments: He is mildly tender over the right sciatic notch. ROM of the spine is full   Neurological:     Mental Status: He is alert.           Assessment & Plan:  Right sided sciatica. Treat with a Medrol dose pack. He can use Norco as needed for pain. Refer to Neurosurgery to evaluate.  Gershon Crane, MD

## 2023-02-23 NOTE — Addendum Note (Signed)
Addended by: Carola Rhine on: 02/23/2023 01:04 PM   Modules accepted: Orders

## 2023-02-28 ENCOUNTER — Encounter: Payer: Self-pay | Admitting: Family Medicine

## 2023-03-01 ENCOUNTER — Telehealth: Payer: Self-pay | Admitting: Family Medicine

## 2023-03-01 ENCOUNTER — Other Ambulatory Visit: Payer: Self-pay

## 2023-03-01 ENCOUNTER — Encounter: Payer: Self-pay | Admitting: Family Medicine

## 2023-03-01 ENCOUNTER — Other Ambulatory Visit (HOSPITAL_BASED_OUTPATIENT_CLINIC_OR_DEPARTMENT_OTHER): Payer: Self-pay

## 2023-03-01 ENCOUNTER — Encounter (HOSPITAL_BASED_OUTPATIENT_CLINIC_OR_DEPARTMENT_OTHER): Payer: Self-pay | Admitting: Emergency Medicine

## 2023-03-01 ENCOUNTER — Emergency Department (HOSPITAL_BASED_OUTPATIENT_CLINIC_OR_DEPARTMENT_OTHER)
Admission: EM | Admit: 2023-03-01 | Discharge: 2023-03-01 | Disposition: A | Discharge status: Home / Self Care | Payer: BC Managed Care – PPO | Attending: Emergency Medicine | Admitting: Emergency Medicine

## 2023-03-01 DIAGNOSIS — Z7984 Long term (current) use of oral hypoglycemic drugs: Secondary | ICD-10-CM | POA: Insufficient documentation

## 2023-03-01 DIAGNOSIS — M5441 Lumbago with sciatica, right side: Secondary | ICD-10-CM | POA: Diagnosis not present

## 2023-03-01 DIAGNOSIS — E119 Type 2 diabetes mellitus without complications: Secondary | ICD-10-CM | POA: Diagnosis not present

## 2023-03-01 DIAGNOSIS — Z7982 Long term (current) use of aspirin: Secondary | ICD-10-CM | POA: Diagnosis not present

## 2023-03-01 DIAGNOSIS — M5431 Sciatica, right side: Secondary | ICD-10-CM

## 2023-03-01 DIAGNOSIS — M549 Dorsalgia, unspecified: Secondary | ICD-10-CM | POA: Diagnosis not present

## 2023-03-01 DIAGNOSIS — M545 Low back pain, unspecified: Secondary | ICD-10-CM | POA: Diagnosis not present

## 2023-03-01 LAB — URINALYSIS, ROUTINE W REFLEX MICROSCOPIC
Bilirubin Urine: NEGATIVE
Glucose, UA: NEGATIVE mg/dL
Hgb urine dipstick: NEGATIVE
Ketones, ur: NEGATIVE mg/dL
Leukocytes,Ua: NEGATIVE
Nitrite: NEGATIVE
Protein, ur: NEGATIVE mg/dL
Specific Gravity, Urine: 1.014 (ref 1.005–1.030)
pH: 5.5 (ref 5.0–8.0)

## 2023-03-01 MED ORDER — HYDROMORPHONE HCL 1 MG/ML IJ SOLN
0.5000 mg | Freq: Once | INTRAMUSCULAR | Status: AC
  Administered 2023-03-01: 0.5 mg via INTRAMUSCULAR
  Filled 2023-03-01: qty 1

## 2023-03-01 MED ORDER — HYDROMORPHONE HCL 1 MG/ML IJ SOLN: 0.5000 mg | Freq: Once | INTRAMUSCULAR | Status: DC

## 2023-03-01 MED ORDER — OXYCODONE-ACETAMINOPHEN 10-325 MG PO TABS: 1.0000 | ORAL_TABLET | ORAL | 0 refills | Status: DC | PRN

## 2023-03-01 MED ORDER — LIDOCAINE 5 % EX PTCH
1.0000 | MEDICATED_PATCH | Freq: Once | CUTANEOUS | Status: DC
  Administered 2023-03-01: 1 via TRANSDERMAL
  Filled 2023-03-01: qty 1

## 2023-03-01 NOTE — Discharge Instructions (Addendum)
It was a pleasure taking care of you today!   Call your primary care provider today to set up a follow-up appointment regarding today's ED visit for your pain management.  Call your neurosurgeon and set up a follow-up appointment regarding today's ED visit.  At home you may take over-the-counter 500 mg Tylenol every 6 hours for no more than 7 days.  You have been given a Lidoderm patch today in the ED, remove or replace with a new patch every 12 hours.  You may pick up lidocaine patches over-the-counter.  Remove and replace with a new patch every 12 hours. You may apply heat to the affected area for up to 15 minutes at a time.  Ensure to place a barrier between your skin and the heat. Return to the Emergency Department if you are experiencing loss of bowel or bladder, increasing/worsening symptoms, fever, inability to walk.

## 2023-03-01 NOTE — Telephone Encounter (Signed)
Pt message has been sent to Dr Clent Ridges for advise, will call pt when Dr Clent Ridges responds back

## 2023-03-01 NOTE — Telephone Encounter (Signed)
I sent in some Percocet, which is much stronger for pain than what he has been taking. I still believe he needs surgery for the spinal stenosis in his lower spine. He should follow up with Dr. Franky Macho

## 2023-03-01 NOTE — Telephone Encounter (Signed)
Spoke with pt advised of Dr Fry recommendation, pt verbalized understanding 

## 2023-03-01 NOTE — ED Provider Notes (Signed)
[facility] AT [facility] Provider Note   CSN: [id number] Arrival date & time: 03/01/23  1019    History  Chief Complaint  Patient presents with   Back Pain    Nuriel Dimuzio. is a 77 y.o. male with a past medical history of sciatica, DM, who presents emergency department brought in by EMS with concerns for lower back pain that radiates to the right leg x 12 days.  Patient has been evaluated previously by his neurosurgeon, Dr. Franky Macho.  Was just seen by his primary care provider on 02/25/2023 for similar symptoms.  He has been taking hydrocodone and a Medrol Dosepak with his last dose of his Medrol Dosepak being today.  He has an appointment with pain management clinic on next week.  Patient called his neurosurgeons office today regarding his pain and they were instructed that he has to do at least 8 weeks of supportive therapy as per his insurance.  Patient denies recent fall, injury, trauma.  Notes that last night he rolled over in bed and his pain became worse.  Denies bowel/bladder incontinence, fever, urinary symptoms.  The history is provided by the patient and the spouse. No language interpreter was used.       Home Medications Prior to Admission medications   Medication Sig Start Date End Date Taking? Authorizing Provider  allopurinol (ZYLOPRIM) 100 MG tablet TAKE 1 TABLET(100 MG) BY MOUTH DAILY 12/26/22   Nelwyn Salisbury, MD  aspirin EC 81 MG tablet Take 1 tablet (81 mg total) by mouth daily. Swallow whole. 10/28/22   Leatha Gilding, MD  brimonidine (ALPHAGAN) 0.2 % ophthalmic solution Place 1 drop into both eyes as needed. Used 1 or 2 a year 08/13/19   [provider]  calcium-vitamin D (OSCAL WITH D) 500-200 MG-UNIT per tablet Take 2 tablets by mouth daily.    [provider]  colchicine 0.6 MG tablet Take 1 tablet (0.6 mg total) by mouth every 6 (six) hours as needed (gout). 06/17/21   Nelwyn Salisbury, MD  CONTOUR NEXT TEST  test strip USE TO TEST BLOOD SUGAR 3 TO 4 TIMES DAILY 01/30/23   Nelwyn Salisbury, MD  fluorouracil (EFUDEX) 5 % cream Apply topically. 06/21/22   [provider]  FOLIC ACID PO Take 10 mg by mouth daily.     [provider]  gabapentin (NEURONTIN) 300 MG capsule TAKE 1 CAPSULE(300 MG) BY MOUTH TWICE DAILY 08/08/22   Nelwyn Salisbury, MD  glipiZIDE (GLUCOTROL) 10 MG tablet Take 1 tablet (10 mg total) by mouth 2 (two) times daily before a meal. 05/31/22   Nelwyn Salisbury, MD  HUMIRA PEN 40 MG/0.4ML PNKT SMARTSIG:40 Milligram(s) SUB-Q Every 2 Weeks 06/06/22   [provider]  HYDROcodone-acetaminophen (NORCO) 5-325 MG tablet Take 1 tablet by mouth every 4 (four) hours as needed for moderate pain. 02/23/23   Nelwyn Salisbury, MD  lisinopril (ZESTRIL) 10 MG tablet Take 1 tablet (10 mg total) by mouth daily. 05/30/22   Nelwyn Salisbury, MD  methotrexate (RHEUMATREX) 2.5 MG tablet Take 4 tablets by mouth once a week. 01/07/20   [provider]  methylPREDNISolone (MEDROL DOSEPAK) 4 MG TBPK tablet As directed 02/23/23   Nelwyn Salisbury, MD  Microlet Lancets MISC USE TO TEST DAILY 10/31/22   Nelwyn Salisbury, MD  Multiple Vitamin (MULTIVITAMIN) tablet Take 1 tablet by mouth daily.    [provider]  pioglitazone (ACTOS) 30 MG tablet Take  1.5 tablets (45 mg total) by mouth daily. 01/19/23   Nelwyn Salisbury, MD  rosuvastatin (CRESTOR) 20 MG tablet TAKE 1 TABLET BY MOUTH DAILY 09/05/22   Nelwyn Salisbury, MD  timolol (TIMOPTIC) 0.5 % ophthalmic solution 1 drop 2 (two) times daily. 06/05/22   [provider]  traMADol (ULTRAM) 50 MG tablet TAKE 2 TABLETS(100 MG) BY MOUTH EVERY 6 HOURS AS NEEDED FOR SEVERE PAIN 01/03/23   Nelwyn Salisbury, MD  zolpidem (AMBIEN) 10 MG tablet TAKE 1 TABLET(10 MG) BY MOUTH AT BEDTIME 09/02/22   Nelwyn Salisbury, MD      Allergies    Contrast media [iodinated contrast media], Red dye, Fluorescein, and Ioxaglate    Review of Systems   Review of Systems   Musculoskeletal:  Positive for back pain.  All other systems reviewed and are negative.   Physical Exam Updated Vital Signs BP 138/66 (BP Location: Right Arm)   Pulse 68   Temp 97.6 F (36.4 C) (Oral)   Resp 16   SpO2 100%  Physical Exam Vitals and nursing note reviewed.  Constitutional:      General: He is not in acute distress.    Appearance: Normal appearance.  Eyes:     General: No scleral icterus.    Extraocular Movements: Extraocular movements intact.  Cardiovascular:     Rate and Rhythm: Normal rate.  Pulmonary:     Effort: Pulmonary effort is normal. No respiratory distress.  Abdominal:     Palpations: Abdomen is soft. There is no mass.     Tenderness: There is no abdominal tenderness.  Musculoskeletal:        General: Normal range of motion.     Cervical back: Neck supple.     Comments: No spinal TTP. No TTP noted to musculature of back. Negative SLR bilaterally. Decreased strength to RLE secondary to pain.   Skin:    General: Skin is warm and dry.     Findings: No rash.  Neurological:     Mental Status: He is alert.     Sensory: Sensation is intact.     Motor: Motor function is intact.  Psychiatric:        Behavior: Behavior normal.     ED Results / Procedures / Treatments   Labs (all labs ordered are listed, but only abnormal results are displayed) Labs Reviewed  URINALYSIS, ROUTINE W REFLEX MICROSCOPIC    EKG None  Radiology No results found.  Procedures Procedures    Medications Ordered in ED Medications  lidocaine (LIDODERM) 5 % 1 patch (1 patch Transdermal Patch Applied 03/01/23 1113)  HYDROmorphone (DILAUDID) injection 0.5 mg (0.5 mg Intramuscular Given 03/01/23 1112)    ED Course/ Medical Decision Making/ A&P Clinical Course as of 03/01/23 1230  Wed Mar 01, 2023  1100 Discussed with patient and wife regarding patient's extensive workup for his back pain.  Due to patient not having any recent fall, injury, trauma, heavy lifting and  his pain being persistent.  Instructed patient and wife that we will get him a dose of pain medication here and have him follow-up with his primary care provider Dr. Clent Ridges as well as his neurosurgeon, Dr. Franky Macho.  Also discussed with patient and wife importance of patient maintaining his appointment for next week with the pain management clinic for evaluation of his symptoms.  Patient agreeable at this time. [SB]  1206 Notified that patient was ambulatory to the bathroom without assistance or difficulty after pain medication in the  ED. [SB]  1218 Re-evaluated patient was resting comfortably on bed.  Patient able to ambulate without assistance or difficulty.  Discussed with patient and wife discharge treatment plan.  Answered all available questions.  Patient appears safe for discharge at this time. [SB]    Clinical Course User Index [SB] Majorie Santee A, PA-C                             Medical Decision Making Amount and/or Complexity of Data Reviewed Labs: ordered.  Risk Prescription drug management.   Patient with right lumbar back pain with radiation to right leg x 12 days. Pt with history of sciatica. No neurological deficits and normal neuro exam.  Patient is ambulatory without assistance or difficulty.  No loss of bowel or bladder control.  No concern for cauda equina.  No fever, night sweats, weight loss, h/o cancer, IVDA, no recent procedure to back. No urinary symptoms suggestive of UTI.  Vital signs, pt afebrile. On exam, patient with No spinal TTP. No TTP noted to musculature of back. Negative SLR bilaterally. Decreased strength to RLE secondary to pain.  No concerning findings on exams. Differential diagnosis includes but is not limited to fracture, herniation, muscle strain, pyelonephritis, nephrolithiasis, acute cystitis.   Additional history obtained:  Additional history obtained from Spouse/Significant Other External records from outside source obtained and reviewed including: Pt  was evaluated by his PCP on 02/23/23 and sent a 30 day course of norco and medrol dose pak. Pt also provided with referral to neurosurgery for follow up  Labs:  I ordered, and personally interpreted labs.  The pertinent results include:   UA unremarkable   Medications:  I ordered medication including dialudid, warm compress, lidoderm patch for pain management Reevaluation of the patient after these medicines and interventions, I reevaluated the patient and found that they have improved I have reviewed the patients home medicines and have made adjustments as needed   Disposition: Presentation suspicious for sciatica of right side. Doubt concerns for fracture, dislocation, herniation. Doubt acute cystitis, pyelonephritis, nephrolithiasis, urinalysis without acute findings today.  Pt without any new injury, trauma, fall. After consideration of the diagnostic results and the patients response to treatment, I feel that the patient would benefit from Discharge home. Ambulatory referral to his NeuroSurgeons' office with Dr. Franky Macho. Instructed to follow up with PCP for medication management. Pt has appointment with pain management next week and encouraged to maintain follow up.  Discussed importance of follow-up with primary care provider for management.  Supportive care measures and strict return precautions discussed with patient at bedside. Pt acknowledges and verbalizes understanding. Pt appears safe for discharge. Follow up as indicated in discharge paperwork.    This chart was dictated using voice recognition software, Dragon. Despite the best efforts of this provider to proofread and correct errors, errors may still occur which can change documentation meaning.   Final Clinical Impression(s) / ED Diagnoses Final diagnoses:  Sciatica, right side    Rx / DC Orders ED Discharge Orders          Ordered    Ambulatory referral to Neurosurgery        03/01/23 1227              Cariann Kinnamon,  Gregery Walberg A, PA-C 03/01/23 1230    Rexford Maus, DO 03/01/23 1532

## 2023-03-01 NOTE — Telephone Encounter (Signed)
Pt is asking for a call back to discuss.  Pt asked that CMA call back at her earliest convenience.

## 2023-03-01 NOTE — ED Triage Notes (Signed)
Pt arrives to ED via [facility] with c/o lower back pain with radiation to right leg x12 days.

## 2023-03-01 NOTE — Telephone Encounter (Signed)
Pt is referring to Hydrocodone

## 2023-03-02 DIAGNOSIS — Z79899 Other long term (current) drug therapy: Secondary | ICD-10-CM | POA: Diagnosis not present

## 2023-03-02 DIAGNOSIS — H30033 Focal chorioretinal inflammation, peripheral, bilateral: Secondary | ICD-10-CM | POA: Diagnosis not present

## 2023-03-02 NOTE — Telephone Encounter (Signed)
Dr Clent Ridges is aware and took care of pt need

## 2023-03-02 NOTE — Telephone Encounter (Signed)
Dr. Fry is aware.  

## 2023-03-02 NOTE — Telephone Encounter (Signed)
Error. Please disregard

## 2023-03-07 ENCOUNTER — Encounter: Payer: Self-pay | Admitting: Family Medicine

## 2023-03-07 DIAGNOSIS — M5136 Other intervertebral disc degeneration, lumbar region: Secondary | ICD-10-CM | POA: Diagnosis not present

## 2023-03-07 DIAGNOSIS — M5416 Radiculopathy, lumbar region: Secondary | ICD-10-CM | POA: Diagnosis not present

## 2023-03-07 MED ORDER — OXYCODONE-ACETAMINOPHEN 10-325 MG PO TABS: 1.0000 | ORAL_TABLET | ORAL | 0 refills | Status: DC | PRN

## 2023-03-07 NOTE — Telephone Encounter (Signed)
Done

## 2023-03-08 ENCOUNTER — Other Ambulatory Visit: Payer: Self-pay | Admitting: Family Medicine

## 2023-03-13 ENCOUNTER — Encounter: Payer: Self-pay | Admitting: Family Medicine

## 2023-03-13 ENCOUNTER — Telehealth: Payer: Self-pay | Admitting: Family Medicine

## 2023-03-13 NOTE — Telephone Encounter (Signed)
Why does he want to switch this?

## 2023-03-13 NOTE — Telephone Encounter (Signed)
Pt asking if there is a substitution for the oxyCODONE-acetaminophen (PERCOCET) 10-325 MG tablet and would like to change over.    [id number] [place], [place] - [address] AT [facility] OF [address] Phone: [phone]  Fax: [phone]

## 2023-03-13 NOTE — Telephone Encounter (Signed)
Pt LOV was on 02/23/23 Last refill was done on 03/07/23 for 30 tablets Please advise

## 2023-03-14 DIAGNOSIS — E119 Type 2 diabetes mellitus without complications: Secondary | ICD-10-CM | POA: Diagnosis not present

## 2023-03-14 DIAGNOSIS — H30033 Focal chorioretinal inflammation, peripheral, bilateral: Secondary | ICD-10-CM | POA: Diagnosis not present

## 2023-03-14 DIAGNOSIS — H3581 Retinal edema: Secondary | ICD-10-CM | POA: Diagnosis not present

## 2023-03-14 DIAGNOSIS — Z961 Presence of intraocular lens: Secondary | ICD-10-CM | POA: Diagnosis not present

## 2023-03-15 MED ORDER — OXYCODONE-ACETAMINOPHEN 10-325 MG PO TABS: 1.0000 | ORAL_TABLET | ORAL | 0 refills | Status: DC | PRN

## 2023-03-15 NOTE — Telephone Encounter (Signed)
Spoke with pt states that he just wants to get a refill for the same medication, he is completely out

## 2023-03-15 NOTE — Telephone Encounter (Signed)
I sent in 30 more pills

## 2023-03-15 NOTE — Addendum Note (Signed)
Addended by: Gershon Crane A on: 03/15/2023 05:11 PM   Modules accepted: Orders

## 2023-03-17 NOTE — Telephone Encounter (Signed)
I sent this in on 03-15-23

## 2023-03-20 DIAGNOSIS — M25812 Other specified joint disorders, left shoulder: Secondary | ICD-10-CM | POA: Diagnosis not present

## 2023-03-23 ENCOUNTER — Other Ambulatory Visit: Payer: Self-pay | Admitting: Family Medicine

## 2023-03-23 ENCOUNTER — Encounter: Payer: Self-pay | Admitting: Family Medicine

## 2023-03-24 DIAGNOSIS — M5416 Radiculopathy, lumbar region: Secondary | ICD-10-CM | POA: Diagnosis not present

## 2023-03-24 MED ORDER — OXYCODONE-ACETAMINOPHEN 10-325 MG PO TABS: 1.0000 | ORAL_TABLET | ORAL | 0 refills | Status: DC | PRN

## 2023-03-24 NOTE — Telephone Encounter (Signed)
I sent this in

## 2023-03-28 DIAGNOSIS — H6123 Impacted cerumen, bilateral: Secondary | ICD-10-CM | POA: Diagnosis not present

## 2023-03-28 DIAGNOSIS — Z974 Presence of external hearing-aid: Secondary | ICD-10-CM | POA: Insufficient documentation

## 2023-04-02 NOTE — Progress Notes (Unsigned)
Cardiology Office Note:    Date:  04/03/2023   ID:  [name] Jaksch., DOB 05-02-1946, MRN [id number]  PCP:  Nelwyn Salisbury, MD  Cardiologist:  Denaya Horn  Electrophysiologist:  None   Referring MD: Nelwyn Salisbury, MD   Chief Complaint  Patient presents with   Hypertension   orthostatic hypotension     Prior Notes;    [name] [name]. is a 77 y.o. male with a hx of HTN, hyperlipidemia, palpitations. Takes Humira for Iritis   He has a strong family hx of atrial fib ( father and sister )   The palptations seem to be worse with lack of sleep and before he eats .  Feels like his heart stops for a second or so ,  Then might have another episode less than a minute later.   No syncope or near syncope  Walks regularly ,  3-4 times a week .  2 miles at a time .  Feels well with walking .   BP is well controlled.   Aug. 5, 2022 [name] [name] is seen today for follow up of his hypertension and history of palpitations.  He also has a history of hyperlipidemia.  Event monitor showed occasional PVCs, no atrial fib   Has developed a neuropathy Is on gabapentin - which helps with the pain   March 29, 2022 [name] [name] is seen for folllow up of his HLD, HTN Echo from June, 2021: Normal LV systolic function, mild diastolic dysfunction. Minimal MR and AI Event monitor shows occasional PVCs No evidence of atrial fib   Has some mild orthostatic hypotension.  Not all that limiting  Advised better hydration ( V-8 juice in the morning , Liquid IV in his water )    April 03, 2023 [name] [name] is seen for follow up of his HTN, HLD , palitations Has occasional orthostatic hypotension  Continues to have some back pain  Had an injection which has helped  Has occasional leg cramps at night .     Past Medical History:  Diagnosis Date   Arthritis    Cataract    Bil/ right eye surgery   Diabetes mellitus    type II   Diverticulitis of colon    ED (erectile dysfunction)    Gout    no meds   H/O  echocardiogram 2013   normal    History of irregular heartbeat    Per pt, heart skips beats at times/had Echo   Hyperlipidemia    Hypertension    Iritis, recurrent     se[facility] Eustaquio Maize at [facility]    Normal cardiac stress test 09/27/2002   Palpitations    PVCs and PACs per event monitor   Uveitis    sees Dr Peggyann Shoals at [facility] Dr Sherryll Burger now    Past Surgical History:  Procedure Laterality Date   APPENDECTOMY     CATARACT EXTRACTION     Dr Dione Booze , left eye/ 2 years ago   CATARACT EXTRACTION  03/16/2017   Right eye   COLONOSCOPY  04/20/2017   per Dr. Myrtie Neither, adenomatous polyps, repeat in 5 yrs    GANGLION CYST EXCISION Right 11/29/2021   Procedure: RIGHT REMOVAL GANGLION OF WRIST;  Surgeon: Marlyne Beards, MD;  Location: [facility];  Service: Orthopedics;  Laterality: Right;   SHOULDER ARTHROSCOPY     both shoulders    TONSILLECTOMY     TONSILLECTOMY AND ADENOIDECTOMY     tubes in ears  55 years ago    Current Medications: Current Meds  Medication Sig   allopurinol (ZYLOPRIM) 100 MG tablet TAKE 1 TABLET(100 MG) BY MOUTH DAILY   aspirin EC 81 MG tablet Take 1 tablet (81 mg total) by mouth daily. Swallow whole.   brimonidine (ALPHAGAN) 0.2 % ophthalmic solution Place 1 drop into both eyes as needed. Used 1 or 2 a year   calcium-vitamin D (OSCAL WITH D) 500-200 MG-UNIT per tablet Take 2 tablets by mouth daily.   colchicine 0.6 MG tablet Take 1 tablet (0.6 mg total) by mouth every 6 (six) hours as needed (gout).   CONTOUR NEXT TEST test strip USE TO TEST BLOOD SUGAR 3 TO 4 TIMES DAILY   fluorouracil (EFUDEX) 5 % cream Apply topically.   FOLIC ACID PO Take 10 mg by mouth daily.    gabapentin (NEURONTIN) 300 MG capsule TAKE 1 CAPSULE(300 MG) BY MOUTH TWICE DAILY   glipiZIDE (GLUCOTROL) 10 MG tablet Take 1 tablet (10 mg total) by mouth 2 (two) times daily before a meal.   HUMIRA PEN 40 MG/0.4ML PNKT SMARTSIG:40 Milligram(s) SUB-Q Every 2 Weeks   lisinopril  (ZESTRIL) 10 MG tablet Take 1 tablet (10 mg total) by mouth daily.   methotrexate (RHEUMATREX) 2.5 MG tablet Take 4 tablets by mouth once a week.   methylPREDNISolone (MEDROL DOSEPAK) 4 MG TBPK tablet As directed   Microlet Lancets MISC USE TO TEST DAILY   Multiple Vitamin (MULTIVITAMIN) tablet Take 1 tablet by mouth daily.   pioglitazone (ACTOS) 30 MG tablet Take 1.5 tablets (45 mg total) by mouth daily.   rosuvastatin (CRESTOR) 20 MG tablet TAKE 1 TABLET BY MOUTH DAILY   timolol (TIMOPTIC) 0.5 % ophthalmic solution 1 drop 2 (two) times daily.   traMADol (ULTRAM) 50 MG tablet TAKE 2 TABLETS(100 MG) BY MOUTH EVERY 6 HOURS AS NEEDED FOR SEVERE PAIN   zolpidem (AMBIEN) 10 MG tablet TAKE 1 TABLET(10 MG) BY MOUTH AT BEDTIME   Current Facility-Administered Medications for the 04/03/23 encounter (Office Visit) with Sheriece Jefcoat, Deloris Ping, MD  Medication   0.9 %  sodium chloride infusion     Allergies:   Contrast media [iodinated contrast media], Red dye, Fluorescein, and Ioxaglate   Social History   Socioeconomic History   Marital status: Married    Spouse name: Not on file   Number of children: 1   Years of education: Not on file   Highest education level: Bachelor's degree (e.g., BA, AB, BS)  Occupational History   Occupation: Marine scientist  Tobacco Use   Smoking status: Former    Current packs/day: 0.00    Average packs/day: 1 pack/day for 8.0 years (8.0 ttl pk-yrs)    Types: Cigarettes    Start date: 02/06/1962    Quit date: 02/06/1970    Years since quitting: 53.1   Smokeless tobacco: Never  Substance and Sexual Activity   Alcohol use: Not Currently    Alcohol/week: 7.0 standard drinks of alcohol    Types: 7 Standard drinks or equivalent per week    Comment: glass of wine most days   Drug use: No   Sexual activity: Not on file  Other Topics Concern   Not on file  Social History Narrative   Not on file   Social Determinants of Health   Financial Resource Strain: Low Risk   (08/25/2022)   Overall Financial Resource Strain (CARDIA)    Difficulty of Paying Living Expenses: Not hard at all  Food Insecurity: No Food Insecurity (  10/28/2022)   Hunger Vital Sign    Worried About Running Out of Food in the Last Year: Never true    Ran Out of Food in the Last Year: Never true  Transportation Needs: No Transportation Needs (10/28/2022)   PRAPARE - Administrator, Civil Service (Medical): No    Lack of Transportation (Non-Medical): No  Physical Activity: Insufficiently Active (08/25/2022)   Exercise Vital Sign    Days of Exercise per Week: 3 days    Minutes of Exercise per Session: 40 min  Stress: No Stress Concern Present (08/25/2022)   [employer] of Occupational Health - Occupational Stress Questionnaire    Feeling of Stress : Only a little  Social Connections: Socially Integrated (08/25/2022)   Social Connection and Isolation Panel [NHANES]    Frequency of Communication with Friends and Family: More than three times a week    Frequency of Social Gatherings with Friends and Family: More than three times a week    Attends Religious Services: More than 4 times per year    Active Member of [employer] or Organizations: Yes    Attends Engineer, structural: More than 4 times per year    Marital Status: Married     Family History: The patient's family history includes Diabetes in his paternal grandfather; Heart attack in his paternal grandmother; Heart attack (age of onset: 91) in his father; Heart disease in his father and mother; Kidney disease in his father; Thyroid cancer in his brother and mother. There is no history of Stomach cancer, Colon polyps, Esophageal cancer, or Rectal cancer.  ROS:   Please see the history of present illness.     All other systems reviewed and are negative.  EKGs/Labs/Other Studies Reviewed:    The following studies were reviewed today:   Recent Labs: 05/30/2022: TSH 1.56 10/28/2022: ALT 20; BUN 35; Creatinine,  Ser 1.29; Hemoglobin 14.4; Platelets 165; Potassium 4.2; Sodium 136  Recent Lipid Panel    Component Value Date/Time   CHOL 139 05/30/2022 1010   TRIG 115.0 05/30/2022 1010   TRIG 284 (HH) 09/20/2006 0929   HDL 40.00 05/30/2022 1010   CHOLHDL 3 05/30/2022 1010   VLDL 23.0 05/30/2022 1010   LDLCALC 76 05/30/2022 1010   LDLCALC 71 05/21/2020 0833   LDLDIRECT 58.0 05/26/2021 0846    Physical Exam:     Physical Exam: Blood pressure 102/60, pulse 66, height 6\' 1"  (1.854 m), weight 173 lb (78.5 kg), SpO2 98%.       GEN:  Well nourished, well developed in no acute distress HEENT: Normal NECK: No JVD; No carotid bruits LYMPHATICS: No lymphadenopathy CARDIAC: RRR, frequent premature beats ( has PVCs on ECG )  , no murmurs, rubs, gallops RESPIRATORY:  Clear to auscultation without rales, wheezing or rhonchi  ABDOMEN: Soft, non-tender, non-distended MUSCULOSKELETAL:  No edema; No deformity  SKIN: Warm and dry NEUROLOGIC:  Alert and oriented x 3  EKG:        ASSESSMENT:    1. PVC (premature ventricular contraction)   2. Orthostatic hypotension       PLAN:       PVCs:   He is doing well.  He can feel occasional skips but his PVCs seem to be benign. Echo showed normal LV function in 2021    2.  First-degree AV block:    3.  Hypertensive heart disease without congestive heart failure:  He is actually hypotensive today.  I encouraged him to drink a V8  juice or liquid IV to help with his orthostatic hypotension.    4.  Hyperlipidemia: Followed by his primary medical doctor.     Medication Adjustments/Labs and Tests Ordered: Current medicines are reviewed at length with the patient today.  Concerns regarding medicines are outlined above.  No orders of the defined types were placed in this encounter.   No orders of the defined types were placed in this encounter.       Signed, Kristeen Miss, MD  04/03/2023 11:40 AM     [facility]

## 2023-04-03 ENCOUNTER — Encounter: Payer: Self-pay | Admitting: Family Medicine

## 2023-04-03 ENCOUNTER — Encounter: Payer: Self-pay | Admitting: Cardiovascular Disease

## 2023-04-03 ENCOUNTER — Ambulatory Visit (INDEPENDENT_AMBULATORY_CARE_PROVIDER_SITE_OTHER): Payer: BC Managed Care – PPO | Admitting: Family Medicine

## 2023-04-03 ENCOUNTER — Ambulatory Visit: Payer: BC Managed Care – PPO | Attending: Cardiovascular Disease | Admitting: Cardiovascular Disease

## 2023-04-03 VITALS — BP 110/64 | HR 66 | Temp 98.4°F | Wt 173.0 lb

## 2023-04-03 VITALS — BP 102/60 | HR 66 | Ht 73.0 in | Wt 173.0 lb

## 2023-04-03 DIAGNOSIS — R350 Frequency of micturition: Secondary | ICD-10-CM

## 2023-04-03 DIAGNOSIS — G4762 Sleep related leg cramps: Secondary | ICD-10-CM | POA: Diagnosis not present

## 2023-04-03 DIAGNOSIS — N410 Acute prostatitis: Secondary | ICD-10-CM | POA: Diagnosis not present

## 2023-04-03 DIAGNOSIS — I493 Ventricular premature depolarization: Secondary | ICD-10-CM | POA: Diagnosis not present

## 2023-04-03 DIAGNOSIS — I951 Orthostatic hypotension: Secondary | ICD-10-CM | POA: Insufficient documentation

## 2023-04-03 LAB — POC URINALSYSI DIPSTICK (AUTOMATED)
Bilirubin, UA: NEGATIVE
Blood, UA: NEGATIVE
Glucose, UA: NEGATIVE
Ketones, UA: NEGATIVE
Leukocytes, UA: NEGATIVE
Nitrite, UA: NEGATIVE
Protein, UA: NEGATIVE
Spec Grav, UA: 1.015 (ref 1.010–1.025)
Urobilinogen, UA: 0.2 E.U./dL
pH, UA: 6 (ref 5.0–8.0)

## 2023-04-03 MED ORDER — CIPROFLOXACIN HCL 500 MG PO TABS: 500.0000 mg | ORAL_TABLET | Freq: Two times a day (BID) | ORAL | 0 refills | Status: AC

## 2023-04-03 NOTE — Patient Instructions (Signed)
Medication Instructions:  Your physician recommends that you continue on your current medications as directed. Please refer to the Current Medication list given to you today.  *If you need a refill on your cardiac medications before your next appointment, please call your pharmacy*  Lab Work: None ordered today.  Testing/Procedures: None ordered today.  Follow-Up: At [facility], you and your health needs are our priority.  As part of our continuing mission to provide you with exceptional heart care, we have created designated Provider Care Teams.  These Care Teams include your primary Cardiologist (physician) and Advanced Practice Providers (APPs -  Physician Assistants and Nurse Practitioners) who all work together to provide you with the care you need, when you need it.  Your next appointment:   1 year(s)  The format for your next appointment:   In Person  Provider:   Philip Nahser, MD { 

## 2023-04-03 NOTE — Progress Notes (Signed)
   Subjective:    Patient ID: [name] [name]., male    DOB: [birth year], 77 y.o.   MRN: [id number]  HPI Here for 2 issues. First about one week ago he suddenly developed urgency to urinate and he has to urinate more frequently than normal. No fever. No blood in the urine. No burning. Also for several weeks he has had leg cramps during the night, usually involving the calves. He has never had these before. He drinks plenty of water every day.    Review of Systems  Constitutional: Negative.   Respiratory: Negative.    Cardiovascular: Negative.   Gastrointestinal: Negative.   Genitourinary:  Positive for frequency and urgency. Negative for difficulty urinating, dysuria, flank pain, hematuria and testicular pain.       Objective:   Physical Exam Constitutional:      Appearance: Normal appearance. He is not ill-appearing.  Cardiovascular:     Rate and Rhythm: Normal rate and regular rhythm.     Pulses: Normal pulses.     Heart sounds: Normal heart sounds.  Pulmonary:     Effort: Pulmonary effort is normal.     Breath sounds: Normal breath sounds.  Abdominal:     Tenderness: There is no right CVA tenderness or left CVA tenderness.  Neurological:     Mental Status: He is alert.           Assessment & Plan:  He likely has a prostate infection, and we will treat this with 10 days of Cipro. For the nocturnal leg cramps, he will take 1 or 2 400 mg magnesium tablets at bedtime.  Gershon Crane, MD

## 2023-04-06 DIAGNOSIS — M4316 Spondylolisthesis, lumbar region: Secondary | ICD-10-CM | POA: Diagnosis not present

## 2023-04-15 HISTORY — PX: REVERSE SHOULDER ARTHROPLASTY: SHX5054

## 2023-04-17 ENCOUNTER — Encounter: Payer: Self-pay | Admitting: Family Medicine

## 2023-04-18 MED ORDER — MELOXICAM 15 MG PO TABS: 15.0000 mg | ORAL_TABLET | Freq: Every day | ORAL | 3 refills | Status: DC

## 2023-04-18 NOTE — Telephone Encounter (Signed)
Call in Meloxicam 15 mg daily, #90 with 3 rf  

## 2023-04-19 ENCOUNTER — Other Ambulatory Visit: Payer: Self-pay | Admitting: Family Medicine

## 2023-04-20 ENCOUNTER — Telehealth: Payer: Self-pay | Admitting: *Deleted

## 2023-04-20 NOTE — Telephone Encounter (Signed)
   Pre-operative Risk Assessment    Patient Name: [name] [name].  DOB: Mar 24, 1946 MRN: [id number]      Request for Surgical Clearance    Procedure:   LEFT REVERSE SHOULDER ARTHROPLASTY  Date of Surgery:  Clearance TBD                                 Surgeon:  DR. Caryn Bee SUPPLE  Surgeon's Group or Practice Name:  [facility] Phone number:  [phone] ATTN: Aida Raider Fax number:  [phone]   Type of Clearance Requested:   - Medical ; ASA   Type of Anesthesia:  General    Additional requests/questions:    [name]   04/20/2023, 4:37 PM

## 2023-04-21 NOTE — Telephone Encounter (Signed)
   Name: [name] [name].  DOB: Jun 02, 1946  MRN: [id number]   Primary Cardiologist: Kristeen Miss, MD  Chart reviewed as part of pre-operative protocol coverage. Patient was contacted 04/21/2023 in reference to pre-operative risk assessment for pending surgery as outlined below.  [name] [name]. was last seen on 04/03/2023 by Nahser.  Since that day, [name] [name]. has done very well. Was out on the golf course. No cardiac issues. .  Therefore, based on ACC/AHA guidelines, the patient would be at acceptable risk for the planned procedure without further cardiovascular testing.   The patient was advised that if he develops new symptoms prior to surgery to contact our office to arrange for a follow-up visit, and he verbalized understanding.  I will route this recommendation to the requesting party via Epic fax function and remove from pre-op pool. Please call with questions.  Joni Reining, NP 04/21/2023, 1:52 PM

## 2023-04-25 DIAGNOSIS — M50322 Other cervical disc degeneration at C5-C6 level: Secondary | ICD-10-CM | POA: Diagnosis not present

## 2023-04-25 DIAGNOSIS — M5137 Other intervertebral disc degeneration, lumbosacral region: Secondary | ICD-10-CM | POA: Diagnosis not present

## 2023-04-25 DIAGNOSIS — M9903 Segmental and somatic dysfunction of lumbar region: Secondary | ICD-10-CM | POA: Diagnosis not present

## 2023-04-25 DIAGNOSIS — M50321 Other cervical disc degeneration at C4-C5 level: Secondary | ICD-10-CM | POA: Diagnosis not present

## 2023-05-01 ENCOUNTER — Encounter: Payer: Self-pay | Admitting: Family Medicine

## 2023-05-02 DIAGNOSIS — M50321 Other cervical disc degeneration at C4-C5 level: Secondary | ICD-10-CM | POA: Diagnosis not present

## 2023-05-02 DIAGNOSIS — M50322 Other cervical disc degeneration at C5-C6 level: Secondary | ICD-10-CM | POA: Diagnosis not present

## 2023-05-02 DIAGNOSIS — M5137 Other intervertebral disc degeneration, lumbosacral region: Secondary | ICD-10-CM | POA: Diagnosis not present

## 2023-05-02 DIAGNOSIS — M9903 Segmental and somatic dysfunction of lumbar region: Secondary | ICD-10-CM | POA: Diagnosis not present

## 2023-05-03 ENCOUNTER — Other Ambulatory Visit: Payer: Self-pay | Admitting: Family Medicine

## 2023-05-11 DIAGNOSIS — M9903 Segmental and somatic dysfunction of lumbar region: Secondary | ICD-10-CM | POA: Diagnosis not present

## 2023-05-11 DIAGNOSIS — M50322 Other cervical disc degeneration at C5-C6 level: Secondary | ICD-10-CM | POA: Diagnosis not present

## 2023-05-11 DIAGNOSIS — M5137 Other intervertebral disc degeneration, lumbosacral region: Secondary | ICD-10-CM | POA: Diagnosis not present

## 2023-05-11 DIAGNOSIS — M50321 Other cervical disc degeneration at C4-C5 level: Secondary | ICD-10-CM | POA: Diagnosis not present

## 2023-05-12 DIAGNOSIS — M75122 Complete rotator cuff tear or rupture of left shoulder, not specified as traumatic: Secondary | ICD-10-CM | POA: Diagnosis not present

## 2023-05-12 DIAGNOSIS — M19012 Primary osteoarthritis, left shoulder: Secondary | ICD-10-CM | POA: Diagnosis not present

## 2023-05-12 DIAGNOSIS — G8918 Other acute postprocedural pain: Secondary | ICD-10-CM | POA: Diagnosis not present

## 2023-05-12 DIAGNOSIS — M25712 Osteophyte, left shoulder: Secondary | ICD-10-CM | POA: Diagnosis not present

## 2023-05-19 DIAGNOSIS — Z4731 Aftercare following explantation of shoulder joint prosthesis: Secondary | ICD-10-CM | POA: Insufficient documentation

## 2023-05-19 DIAGNOSIS — Z96612 Presence of left artificial shoulder joint: Secondary | ICD-10-CM | POA: Insufficient documentation

## 2023-05-22 DIAGNOSIS — N1831 Chronic kidney disease, stage 3a: Secondary | ICD-10-CM | POA: Diagnosis not present

## 2023-05-22 DIAGNOSIS — Z96612 Presence of left artificial shoulder joint: Secondary | ICD-10-CM | POA: Diagnosis not present

## 2023-05-22 DIAGNOSIS — I129 Hypertensive chronic kidney disease with stage 1 through stage 4 chronic kidney disease, or unspecified chronic kidney disease: Secondary | ICD-10-CM | POA: Diagnosis not present

## 2023-05-22 DIAGNOSIS — Z79899 Other long term (current) drug therapy: Secondary | ICD-10-CM | POA: Diagnosis not present

## 2023-05-22 DIAGNOSIS — H44113 Panuveitis, bilateral: Secondary | ICD-10-CM | POA: Diagnosis not present

## 2023-05-22 DIAGNOSIS — H30033 Focal chorioretinal inflammation, peripheral, bilateral: Secondary | ICD-10-CM | POA: Diagnosis not present

## 2023-05-22 DIAGNOSIS — E1122 Type 2 diabetes mellitus with diabetic chronic kidney disease: Secondary | ICD-10-CM | POA: Diagnosis not present

## 2023-05-22 DIAGNOSIS — I10 Essential (primary) hypertension: Secondary | ICD-10-CM | POA: Diagnosis not present

## 2023-06-01 DIAGNOSIS — N1831 Chronic kidney disease, stage 3a: Secondary | ICD-10-CM | POA: Diagnosis not present

## 2023-06-05 ENCOUNTER — Ambulatory Visit (INDEPENDENT_AMBULATORY_CARE_PROVIDER_SITE_OTHER): Payer: BC Managed Care – PPO

## 2023-06-05 DIAGNOSIS — Z23 Encounter for immunization: Secondary | ICD-10-CM

## 2023-06-05 NOTE — Progress Notes (Signed)
Per orders of Dr. Clent Ridges, injection of Flu Vaccine given by Stann Ore. Patient tolerated injection well.

## 2023-06-12 DIAGNOSIS — M25512 Pain in left shoulder: Secondary | ICD-10-CM | POA: Diagnosis not present

## 2023-06-12 DIAGNOSIS — Z96612 Presence of left artificial shoulder joint: Secondary | ICD-10-CM | POA: Diagnosis not present

## 2023-06-14 ENCOUNTER — Other Ambulatory Visit: Payer: Self-pay | Admitting: Family Medicine

## 2023-06-19 DIAGNOSIS — M25512 Pain in left shoulder: Secondary | ICD-10-CM | POA: Diagnosis not present

## 2023-06-19 DIAGNOSIS — L570 Actinic keratosis: Secondary | ICD-10-CM | POA: Diagnosis not present

## 2023-06-19 DIAGNOSIS — D1801 Hemangioma of skin and subcutaneous tissue: Secondary | ICD-10-CM | POA: Diagnosis not present

## 2023-06-19 DIAGNOSIS — L821 Other seborrheic keratosis: Secondary | ICD-10-CM | POA: Diagnosis not present

## 2023-06-19 DIAGNOSIS — Z85828 Personal history of other malignant neoplasm of skin: Secondary | ICD-10-CM | POA: Diagnosis not present

## 2023-06-19 DIAGNOSIS — M25511 Pain in right shoulder: Secondary | ICD-10-CM | POA: Diagnosis not present

## 2023-06-19 DIAGNOSIS — L812 Freckles: Secondary | ICD-10-CM | POA: Diagnosis not present

## 2023-06-19 DIAGNOSIS — C44519 Basal cell carcinoma of skin of other part of trunk: Secondary | ICD-10-CM | POA: Diagnosis not present

## 2023-06-19 DIAGNOSIS — Z96612 Presence of left artificial shoulder joint: Secondary | ICD-10-CM | POA: Diagnosis not present

## 2023-06-19 DIAGNOSIS — D045 Carcinoma in situ of skin of trunk: Secondary | ICD-10-CM | POA: Diagnosis not present

## 2023-06-20 ENCOUNTER — Telehealth: Payer: Self-pay | Admitting: *Deleted

## 2023-06-20 NOTE — Telephone Encounter (Signed)
Pre-operative Risk Assessment    Patient Name: [name] [name].  DOB: [birth year] MRN: [id number]      Request for Surgical Clearance    Procedure:   RIGHT REVERSE SHOULDER  Date of Surgery:  Clearance 08/29/23                                 Surgeon:  DR. Caryn Bee SUPPLE Surgeon's Group or Practice Name:  [facility] Phone number:  [phone] Fax number:  [phone]   Type of Clearance Requested:   - Medical  - Pharmacy:  Hold Aspirin NOT INDICATED   Type of Anesthesia:  General    Additional requests/questions:    [name]   06/20/2023, 10:44 AM

## 2023-06-20 NOTE — Telephone Encounter (Signed)
Name: [name] [name].  DOB: Jul 25, 1946  MRN: [id number]  Primary Cardiologist: Kristeen Miss, MD   Preoperative team, please contact this patient and set up a phone call appointment for further preoperative risk assessment. Please obtain consent and complete medication review. Thank you for your help.  Last seen by Dr. Elease Hashimoto 04/03/2023   I confirm that guidance regarding antiplatelet and oral anticoagulation therapy has been completed and, if necessary, noted below.  No request to hold ASA.   I also confirmed the patient resides in the state of [place]. As per [facility] Medical Board telemedicine laws, the patient must reside in the state in which the provider is licensed.   Joni Reining, NP 06/20/2023, 4:20 PM [facility]

## 2023-06-21 ENCOUNTER — Telehealth: Payer: Self-pay

## 2023-06-21 NOTE — Telephone Encounter (Signed)
Patient has now been scheduled for cardiac preop clearancw. Consent provided. Patient agrees to appt date and time

## 2023-06-21 NOTE — Telephone Encounter (Signed)
  Patient Consent for Virtual Visit        [name] [name]. has provided verbal consent on 06/21/2023 for a virtual visit (video or telephone).   CONSENT FOR VIRTUAL VISIT FOR:  [name] [name].  By participating in this virtual visit I agree to the following:  I hereby voluntarily request, consent and authorize [facility] and its employed or contracted physicians, physician assistants, nurse practitioners or other licensed health care professionals (the Practitioner), to provide me with telemedicine health care services (the "Services") as deemed necessary by the treating Practitioner. I acknowledge and consent to receive the Services by the Practitioner via telemedicine. I understand that the telemedicine visit will involve communicating with the Practitioner through live audiovisual communication technology and the disclosure of certain medical information by electronic transmission. I acknowledge that I have been given the opportunity to request an in-person assessment or other available alternative prior to the telemedicine visit and am voluntarily participating in the telemedicine visit.  I understand that I have the right to withhold or withdraw my consent to the use of telemedicine in the course of my care at any time, without affecting my right to future care or treatment, and that the Practitioner or I may terminate the telemedicine visit at any time. I understand that I have the right to inspect all information obtained and/or recorded in the course of the telemedicine visit and may receive copies of available information for a reasonable fee.  I understand that some of the potential risks of receiving the Services via telemedicine include:  Delay or interruption in medical evaluation due to technological equipment failure or disruption; Information transmitted may not be sufficient (e.g. poor resolution of images) to allow for appropriate medical decision making by  the Practitioner; and/or  In rare instances, security protocols could fail, causing a breach of personal health information.  Furthermore, I acknowledge that it is my responsibility to provide information about my medical history, conditions and care that is complete and accurate to the best of my ability. I acknowledge that Practitioner's advice, recommendations, and/or decision may be based on factors not within their control, such as incomplete or inaccurate data provided by me or distortions of diagnostic images or specimens that may result from electronic transmissions. I understand that the practice of medicine is not an exact science and that Practitioner makes no warranties or guarantees regarding treatment outcomes. I acknowledge that a copy of this consent can be made available to me via my patient portal [facility] MyChart), or I can request a printed copy by calling the office of [facility].    I understand that my insurance will be billed for this visit.   I have read or had this consent read to me. I understand the contents of this consent, which adequately explains the benefits and risks of the Services being provided via telemedicine.  I have been provided ample opportunity to ask questions regarding this consent and the Services and have had my questions answered to my satisfaction. I give my informed consent for the services to be provided through the use of telemedicine in my medical care

## 2023-06-26 DIAGNOSIS — Z96612 Presence of left artificial shoulder joint: Secondary | ICD-10-CM | POA: Diagnosis not present

## 2023-06-26 DIAGNOSIS — M25512 Pain in left shoulder: Secondary | ICD-10-CM | POA: Diagnosis not present

## 2023-06-27 ENCOUNTER — Ambulatory Visit: Payer: BC Managed Care – PPO | Admitting: Family Medicine

## 2023-06-27 ENCOUNTER — Other Ambulatory Visit (INDEPENDENT_AMBULATORY_CARE_PROVIDER_SITE_OTHER): Payer: BC Managed Care – PPO

## 2023-06-27 ENCOUNTER — Encounter: Payer: Self-pay | Admitting: Family Medicine

## 2023-06-27 VITALS — BP 110/68 | HR 61 | Temp 98.0°F | Ht 73.0 in | Wt 186.0 lb

## 2023-06-27 DIAGNOSIS — Z0000 Encounter for general adult medical examination without abnormal findings: Secondary | ICD-10-CM

## 2023-06-27 LAB — LIPID PANEL
Cholesterol: 157 mg/dL (ref 0–200)
HDL: 56 mg/dL (ref >39.00)
LDL Cholesterol: 85 mg/dL (ref 0–99)
NonHDL: 101.43
Total CHOL/HDL Ratio: 3
Triglycerides: 80 mg/dL (ref 0.0–149.0)
VLDL: 16 mg/dL (ref 0.0–40.0)

## 2023-06-27 LAB — CBC WITH DIFFERENTIAL/PLATELET
Basophils Absolute: 0 10*3/uL (ref 0.0–0.1)
Basophils Relative: 0.8 % (ref 0.0–3.0)
Eosinophils Absolute: 0.3 10*3/uL (ref 0.0–0.7)
Eosinophils Relative: 4.1 % (ref 0.0–5.0)
HCT: 42.1 % (ref 39.0–52.0)
Hemoglobin: 13.5 g/dL (ref 13.0–17.0)
Lymphocytes Relative: 28.1 % (ref 12.0–46.0)
Lymphs Abs: 1.7 10*3/uL (ref 0.7–4.0)
MCHC: 32.1 g/dL (ref 30.0–36.0)
MCV: 99.3 fL (ref 78.0–100.0)
Monocytes Absolute: 0.5 10*3/uL (ref 0.1–1.0)
Monocytes Relative: 7.7 % (ref 3.0–12.0)
Neutro Abs: 3.7 10*3/uL (ref 1.4–7.7)
Neutrophils Relative %: 59.3 % (ref 43.0–77.0)
Platelets: 188 10*3/uL (ref 150.0–400.0)
RBC: 4.25 Mil/uL (ref 4.22–5.81)
RDW: 16.8 % — ABNORMAL HIGH (ref 11.5–15.5)
WBC: 6.2 10*3/uL (ref 4.0–10.5)

## 2023-06-27 LAB — BASIC METABOLIC PANEL
BUN: 33 mg/dL — ABNORMAL HIGH (ref 6–23)
CO2: 26 meq/L (ref 19–32)
Calcium: 10.1 mg/dL (ref 8.4–10.5)
Chloride: 103 meq/L (ref 96–112)
Creatinine, Ser: 1.16 mg/dL (ref 0.40–1.50)
GFR: 60.53 mL/min (ref >60.00)
Glucose, Bld: 131 mg/dL — ABNORMAL HIGH (ref 70–99)
Potassium: 4.7 meq/L (ref 3.5–5.1)
Sodium: 139 meq/L (ref 135–145)

## 2023-06-27 LAB — HEPATIC FUNCTION PANEL
ALT: 22 U/L (ref 0–53)
AST: 20 U/L (ref 0–37)
Albumin: 4.4 g/dL (ref 3.5–5.2)
Alkaline Phosphatase: 72 U/L (ref 39–117)
Bilirubin, Direct: 0.2 mg/dL (ref 0.0–0.3)
Total Bilirubin: 0.8 mg/dL (ref 0.2–1.2)
Total Protein: 6.7 g/dL (ref 6.0–8.3)

## 2023-06-27 LAB — PSA: PSA: 2.26 ng/mL (ref 0.10–4.00)

## 2023-06-27 LAB — TSH: TSH: 1.33 u[IU]/mL (ref 0.35–5.50)

## 2023-06-27 LAB — HEMOGLOBIN A1C: Hgb A1c MFr Bld: 6.2 % (ref 4.6–6.5)

## 2023-06-27 MED ORDER — MELOXICAM 15 MG PO TABS: 7.5000 mg | ORAL_TABLET | Freq: Every day | ORAL | Status: DC

## 2023-06-27 NOTE — Progress Notes (Signed)
Subjective:    Patient ID: [name] [name]., male    DOB: [birth year], 77 y.o.   MRN: [id number]  HPI Here for a well exam. He feels well in general. He recently saw Dr. Lester [name] of [facility], and he felt his stage 3a CKD was stable. [name] [name] takes 15 mg of Meloxicam daily for his OA, but judging from Dr. Jacklynn [name] note I do not think he was aware of this. [name] [name] has tried to skip the Meloxicam a few times for one day, and his joints hurt so bad he was miserable. He had a left reverse shoulder arthroplasty per Dr. Rennis [name] in August, and this went very well. He is now scheduled to have this procedure on the right side on 08-29-23.    Review of Systems  Constitutional: Negative.   HENT: Negative.    Eyes: Negative.   Respiratory: Negative.    Cardiovascular: Negative.   Gastrointestinal: Negative.   Genitourinary: Negative.   Musculoskeletal:  Positive for arthralgias.  Skin: Negative.   Neurological: Negative.   Psychiatric/Behavioral: Negative.         Objective:   Physical Exam Constitutional:      General: He is not in acute distress.    Appearance: Normal appearance. He is well-developed. He is not diaphoretic.  HENT:     Head: Normocephalic and atraumatic.     Right Ear: External ear normal.     Left Ear: External ear normal.     Nose: Nose normal.     Mouth/Throat:     Pharynx: No oropharyngeal exudate.  Eyes:     General: No scleral icterus.       Right eye: No discharge.        Left eye: No discharge.     Conjunctiva/sclera: Conjunctivae normal.     Pupils: Pupils are equal, round, and reactive to light.  Neck:     Thyroid: No thyromegaly.     Vascular: No JVD.     Trachea: No tracheal deviation.  Cardiovascular:     Rate and Rhythm: Normal rate and regular rhythm.     Pulses: Normal pulses.     Heart sounds: Normal heart sounds. No murmur heard.    No friction rub. No gallop.  Pulmonary:     Effort: Pulmonary effort is normal. No respiratory distress.      Breath sounds: Normal breath sounds. No wheezing or rales.  Chest:     Chest wall: No tenderness.  Abdominal:     General: Bowel sounds are normal. There is no distension.     Palpations: Abdomen is soft. There is no mass.     Tenderness: There is no abdominal tenderness. There is no guarding or rebound.  Genitourinary:    Penis: Normal. No tenderness.      Testes: Normal.  Musculoskeletal:        General: No tenderness. Normal range of motion.     Cervical back: Neck supple.  Lymphadenopathy:     Cervical: No cervical adenopathy.  Skin:    General: Skin is warm and dry.     Coloration: Skin is not pale.     Findings: No erythema or rash.  Neurological:     General: No focal deficit present.     Mental Status: He is alert and oriented to person, place, and time.     Cranial Nerves: No cranial nerve deficit.     Motor: No abnormal muscle tone.     Coordination: Coordination normal.  Deep Tendon Reflexes: Reflexes are normal and symmetric. Reflexes normal.  Psychiatric:        Mood and Affect: Mood normal.        Behavior: Behavior normal.        Thought Content: Thought content normal.        Judgment: Judgment normal.           Assessment & Plan:  Well exam. We discussed diet and exercise. Get fasting labs. Due to his CKD I advised him to try taking only half a pill of Meloxicam (7.5 mg) daily.  Gershon Crane, MD

## 2023-06-28 DIAGNOSIS — H30039 Focal chorioretinal inflammation, peripheral, unspecified eye: Secondary | ICD-10-CM | POA: Diagnosis not present

## 2023-06-28 DIAGNOSIS — Z79899 Other long term (current) drug therapy: Secondary | ICD-10-CM | POA: Diagnosis not present

## 2023-07-03 DIAGNOSIS — Z96612 Presence of left artificial shoulder joint: Secondary | ICD-10-CM | POA: Diagnosis not present

## 2023-07-03 DIAGNOSIS — M25512 Pain in left shoulder: Secondary | ICD-10-CM | POA: Diagnosis not present

## 2023-07-06 DIAGNOSIS — M4316 Spondylolisthesis, lumbar region: Secondary | ICD-10-CM | POA: Diagnosis not present

## 2023-07-18 DIAGNOSIS — H30033 Focal chorioretinal inflammation, peripheral, bilateral: Secondary | ICD-10-CM | POA: Diagnosis not present

## 2023-07-18 DIAGNOSIS — M25512 Pain in left shoulder: Secondary | ICD-10-CM | POA: Diagnosis not present

## 2023-07-18 DIAGNOSIS — Z96612 Presence of left artificial shoulder joint: Secondary | ICD-10-CM | POA: Diagnosis not present

## 2023-07-18 DIAGNOSIS — H3581 Retinal edema: Secondary | ICD-10-CM | POA: Diagnosis not present

## 2023-07-18 DIAGNOSIS — Z961 Presence of intraocular lens: Secondary | ICD-10-CM | POA: Diagnosis not present

## 2023-07-18 DIAGNOSIS — E119 Type 2 diabetes mellitus without complications: Secondary | ICD-10-CM | POA: Diagnosis not present

## 2023-07-24 ENCOUNTER — Other Ambulatory Visit: Payer: Self-pay | Admitting: Family Medicine

## 2023-07-24 ENCOUNTER — Encounter: Payer: Self-pay | Admitting: Cardiovascular Disease

## 2023-07-25 DIAGNOSIS — Z96612 Presence of left artificial shoulder joint: Secondary | ICD-10-CM | POA: Diagnosis not present

## 2023-07-25 DIAGNOSIS — M25512 Pain in left shoulder: Secondary | ICD-10-CM | POA: Diagnosis not present

## 2023-07-27 ENCOUNTER — Encounter: Payer: Self-pay | Admitting: Family Medicine

## 2023-07-28 NOTE — Telephone Encounter (Signed)
FYI

## 2023-07-28 NOTE — Telephone Encounter (Signed)
Noted  

## 2023-08-01 DIAGNOSIS — M25512 Pain in left shoulder: Secondary | ICD-10-CM | POA: Diagnosis not present

## 2023-08-01 DIAGNOSIS — Z96612 Presence of left artificial shoulder joint: Secondary | ICD-10-CM | POA: Diagnosis not present

## 2023-08-02 DIAGNOSIS — Z471 Aftercare following joint replacement surgery: Secondary | ICD-10-CM | POA: Diagnosis not present

## 2023-08-02 DIAGNOSIS — Z96612 Presence of left artificial shoulder joint: Secondary | ICD-10-CM | POA: Diagnosis not present

## 2023-08-04 ENCOUNTER — Telehealth: Payer: BC Managed Care – PPO

## 2023-08-07 DIAGNOSIS — Z87828 Personal history of other (healed) physical injury and trauma: Secondary | ICD-10-CM | POA: Insufficient documentation

## 2023-08-08 ENCOUNTER — Encounter: Payer: Self-pay | Admitting: Family Medicine

## 2023-08-08 NOTE — Telephone Encounter (Signed)
I don't think a referral is needed. We can treat his BPH as needed. He used to take Tamsulosin (Floomax) for this. We can get back on this if he wishes

## 2023-08-15 DIAGNOSIS — M51371 Other intervertebral disc degeneration, lumbosacral region with lower extremity pain only: Secondary | ICD-10-CM | POA: Diagnosis not present

## 2023-08-15 DIAGNOSIS — M50321 Other cervical disc degeneration at C4-C5 level: Secondary | ICD-10-CM | POA: Diagnosis not present

## 2023-08-15 DIAGNOSIS — M9903 Segmental and somatic dysfunction of lumbar region: Secondary | ICD-10-CM | POA: Diagnosis not present

## 2023-08-15 DIAGNOSIS — M50322 Other cervical disc degeneration at C5-C6 level: Secondary | ICD-10-CM | POA: Diagnosis not present

## 2023-08-16 ENCOUNTER — Encounter: Payer: Self-pay | Admitting: Family Medicine

## 2023-08-17 DIAGNOSIS — Z96612 Presence of left artificial shoulder joint: Secondary | ICD-10-CM | POA: Diagnosis not present

## 2023-08-17 DIAGNOSIS — M25512 Pain in left shoulder: Secondary | ICD-10-CM | POA: Diagnosis not present

## 2023-08-18 NOTE — Telephone Encounter (Signed)
I understand what Dr. Patton Salles was thinking, but I would like to stay on Lisinopril because it helps a diabetic patient's kidneys. Tell Al to cu tthe pills in half and just take 1/2 a pill (5 mg) daily

## 2023-08-24 DIAGNOSIS — M25512 Pain in left shoulder: Secondary | ICD-10-CM | POA: Diagnosis not present

## 2023-08-24 DIAGNOSIS — Z96612 Presence of left artificial shoulder joint: Secondary | ICD-10-CM | POA: Diagnosis not present

## 2023-09-01 DIAGNOSIS — Z96612 Presence of left artificial shoulder joint: Secondary | ICD-10-CM | POA: Diagnosis not present

## 2023-09-01 DIAGNOSIS — M25512 Pain in left shoulder: Secondary | ICD-10-CM | POA: Diagnosis not present

## 2023-09-03 ENCOUNTER — Other Ambulatory Visit: Payer: Self-pay | Admitting: Family Medicine

## 2023-09-04 NOTE — Telephone Encounter (Signed)
Pt LOV was on 03/09/23 Please advise

## 2023-09-05 ENCOUNTER — Telehealth: Payer: Self-pay

## 2023-09-05 NOTE — Telephone Encounter (Signed)
   Name: [name] [name].  DOB: May 25, 1946  MRN: [id number]  Primary Cardiologist: Kristeen Miss, MD   Preoperative team, please contact this patient and set up a phone call appointment for further preoperative risk assessment. Please obtain consent and complete medication review. Thank you for your help.  I confirm that guidance regarding antiplatelet and oral anticoagulation therapy has been completed and, if necessary, noted below.  None requested   I also confirmed the patient resides in the state of [place]. As per [facility] Medical Board telemedicine laws, the patient must reside in the state in which the provider is licensed.   Ronney Asters, NP 09/05/2023, 10:47 AM [facility]

## 2023-09-05 NOTE — Telephone Encounter (Addendum)
   Pre-operative Risk Assessment    Patient Name: [name] [name].  DOB: [birth year] MRN: [id number]     Request for Surgical Clearance    Procedure:  right reverse shoulder arthroplasty   Date of Surgery:  Clearance 11/14/23                                 Surgeon:  Dr. Francena Hanly Surgeon's Group or Practice Name:  [facility]  Phone number:  [phone] Fax number:  [phone]   Type of Clearance Requested:   - Medical  -Pharmacy hold Aspirin Type of Anesthesia:  General    Additional requests/questions:    Scarlette Shorts   09/05/2023, 10:24 AM

## 2023-09-05 NOTE — Telephone Encounter (Signed)
Spoke with patient who is agreeable to do a tele visit on 2/10 at 1:40 pm. Consent given, med rec to be completed.

## 2023-09-05 NOTE — Telephone Encounter (Signed)
  Patient Consent for Virtual Visit        [name]. has provided verbal consent on 09/05/2023 for a virtual visit (video or telephone).   CONSENT FOR VIRTUAL VISIT FOR:  [name] [name].  By participating in this virtual visit I agree to the following:  I hereby voluntarily request, consent and authorize [facility] and its employed or contracted physicians, physician assistants, nurse practitioners or other licensed health care professionals (the Practitioner), to provide me with telemedicine health care services (the "Services") as deemed necessary by the treating Practitioner. I acknowledge and consent to receive the Services by the Practitioner via telemedicine. I understand that the telemedicine visit will involve communicating with the Practitioner through live audiovisual communication technology and the disclosure of certain medical information by electronic transmission. I acknowledge that I have been given the opportunity to request an in-person assessment or other available alternative prior to the telemedicine visit and am voluntarily participating in the telemedicine visit.  I understand that I have the right to withhold or withdraw my consent to the use of telemedicine in the course of my care at any time, without affecting my right to future care or treatment, and that the Practitioner or I may terminate the telemedicine visit at any time. I understand that I have the right to inspect all information obtained and/or recorded in the course of the telemedicine visit and may receive copies of available information for a reasonable fee.  I understand that some of the potential risks of receiving the Services via telemedicine include:  Delay or interruption in medical evaluation due to technological equipment failure or disruption; Information transmitted may not be sufficient (e.g. poor resolution of images) to allow for appropriate medical decision making by  the Practitioner; and/or  In rare instances, security protocols could fail, causing a breach of personal health information.  Furthermore, I acknowledge that it is my responsibility to provide information about my medical history, conditions and care that is complete and accurate to the best of my ability. I acknowledge that Practitioner's advice, recommendations, and/or decision may be based on factors not within their control, such as incomplete or inaccurate data provided by me or distortions of diagnostic images or specimens that may result from electronic transmissions. I understand that the practice of medicine is not an exact science and that Practitioner makes no warranties or guarantees regarding treatment outcomes. I acknowledge that a copy of this consent can be made available to me via my patient portal [facility] MyChart), or I can request a printed copy by calling the office of [facility].    I understand that my insurance will be billed for this visit.   I have read or had this consent read to me. I understand the contents of this consent, which adequately explains the benefits and risks of the Services being provided via telemedicine.  I have been provided ample opportunity to ask questions regarding this consent and the Services and have had my questions answered to my satisfaction. I give my informed consent for the services to be provided through the use of telemedicine in my medical care

## 2023-09-07 DIAGNOSIS — Z96612 Presence of left artificial shoulder joint: Secondary | ICD-10-CM | POA: Diagnosis not present

## 2023-09-07 DIAGNOSIS — M25512 Pain in left shoulder: Secondary | ICD-10-CM | POA: Diagnosis not present

## 2023-09-07 MED ORDER — ZOLPIDEM TARTRATE 10 MG PO TABS: 10.0000 mg | ORAL_TABLET | Freq: Every day | ORAL | 1 refills | Status: DC

## 2023-09-07 MED ORDER — FINASTERIDE 5 MG PO TABS: 5.0000 mg | ORAL_TABLET | Freq: Every day | ORAL | 3 refills | Status: DC

## 2023-09-07 NOTE — Telephone Encounter (Signed)
Done

## 2023-09-07 NOTE — Addendum Note (Signed)
Addended by: Gershon Crane A on: 09/07/2023 01:32 PM   Modules accepted: Orders

## 2023-09-10 ENCOUNTER — Other Ambulatory Visit: Payer: Self-pay | Admitting: Family Medicine

## 2023-09-11 DIAGNOSIS — M9903 Segmental and somatic dysfunction of lumbar region: Secondary | ICD-10-CM | POA: Diagnosis not present

## 2023-09-11 DIAGNOSIS — M50321 Other cervical disc degeneration at C4-C5 level: Secondary | ICD-10-CM | POA: Diagnosis not present

## 2023-09-11 DIAGNOSIS — M50322 Other cervical disc degeneration at C5-C6 level: Secondary | ICD-10-CM | POA: Diagnosis not present

## 2023-09-11 DIAGNOSIS — M51371 Other intervertebral disc degeneration, lumbosacral region with lower extremity pain only: Secondary | ICD-10-CM | POA: Diagnosis not present

## 2023-09-14 DIAGNOSIS — Z96612 Presence of left artificial shoulder joint: Secondary | ICD-10-CM | POA: Diagnosis not present

## 2023-09-14 DIAGNOSIS — M25512 Pain in left shoulder: Secondary | ICD-10-CM | POA: Diagnosis not present

## 2023-10-02 NOTE — Telephone Encounter (Signed)
   Patient Name: [name] [name].  DOB: 22-Oct-1945 MRN: [id number]  Primary Cardiologist: Kristeen Miss, MD  Chart reviewed as part of pre-operative protocol coverage.  Patient takes aspirin for history of possible TIA/CVA.  Cardiology does not manage patient's aspirin.  Recommendations for holding aspirin prior to procedure should come from managing provider (PCP/neurology).   Joylene Grapes, NP 10/02/2023, 2:41 PM

## 2023-10-02 NOTE — Telephone Encounter (Signed)
Pt called today and changed his tele preop appt to 10/13/23 as surgery date has been moved up sooner. Pt is on ASA 81 mg daily. I do not see that ASA was noted to be held.   I confirmed with requesting office that they will need ASA to be held. I will forward this to the preop APP today to review if any changes need to be made in their notes.

## 2023-10-04 DIAGNOSIS — M25512 Pain in left shoulder: Secondary | ICD-10-CM | POA: Diagnosis not present

## 2023-10-04 DIAGNOSIS — Z96612 Presence of left artificial shoulder joint: Secondary | ICD-10-CM | POA: Diagnosis not present

## 2023-10-05 DIAGNOSIS — M50321 Other cervical disc degeneration at C4-C5 level: Secondary | ICD-10-CM | POA: Diagnosis not present

## 2023-10-05 DIAGNOSIS — M51371 Other intervertebral disc degeneration, lumbosacral region with lower extremity pain only: Secondary | ICD-10-CM | POA: Diagnosis not present

## 2023-10-05 DIAGNOSIS — M50322 Other cervical disc degeneration at C5-C6 level: Secondary | ICD-10-CM | POA: Diagnosis not present

## 2023-10-05 DIAGNOSIS — M9903 Segmental and somatic dysfunction of lumbar region: Secondary | ICD-10-CM | POA: Diagnosis not present

## 2023-10-12 DIAGNOSIS — M12811 Other specific arthropathies, not elsewhere classified, right shoulder: Secondary | ICD-10-CM | POA: Insufficient documentation

## 2023-10-12 DIAGNOSIS — M25811 Other specified joint disorders, right shoulder: Secondary | ICD-10-CM | POA: Diagnosis not present

## 2023-10-13 ENCOUNTER — Ambulatory Visit: Payer: BC Managed Care – PPO | Attending: Cardiology

## 2023-10-13 DIAGNOSIS — Z01810 Encounter for preprocedural cardiovascular examination: Secondary | ICD-10-CM | POA: Diagnosis not present

## 2023-10-13 NOTE — Progress Notes (Signed)
Virtual Visit via Telephone Note   Because of [name]'s co-morbid illnesses, he is at least at moderate risk for complications without adequate follow up.  This format is felt to be most appropriate for this patient at this time.  The patient did not have access to video technology/had technical difficulties with video requiring transitioning to audio format only (telephone).  All issues noted in this document were discussed and addressed.  No physical exam could be performed with this format.  Please refer to the patient's chart for his consent to telehealth for [facility].  Evaluation Performed:  Preoperative cardiovascular risk assessment _____________   Date:  10/13/2023   Patient ID:  [name] [name]., DOB 10-Jul-1946, MRN [id number] Patient Location:  Home Provider location:   Office  Primary Care Provider:  Nelwyn Salisbury, MD Primary Cardiologist:  Kristeen Miss, MD  Chief Complaint / Patient Profile   78 y.o. y/o male with a h/o PVCs, orthostatic hypotension who is pending right reverse shoulder arthroplasty and presents today for telephonic preoperative cardiovascular risk assessment.  History of Present Illness    [name] [name]. is a 78 y.o. male who presents via audio/video conferencing for a telehealth visit today.  Pt was last seen in cardiology clinic on 04/03/2023 by Dr. Elease Hashimoto.  At that time [name] [name]. was doing well .  The patient is now pending procedure as outlined above. Since his last visit, he remains stable from a cardiac standpoint.  Today he denies chest pain, shortness of breath, lower extremity edema, fatigue, palpitations, melena, hematuria, hemoptysis, diaphoresis, weakness, presyncope, syncope, orthopnea, and PND.   Past Medical History    Past Medical History:  Diagnosis Date   Arthritis    Cataract    Bil/ right eye surgery   Diabetes mellitus    type II   Diverticulitis of colon    ED (erectile  dysfunction)    Gout    no meds   H/O echocardiogram 2013   normal    History of irregular heartbeat    Per pt, heart skips beats at times/had Echo   Hyperlipidemia    Hypertension    Iritis, recurrent     sees Dr. Eustaquio Maize at [facility]    Normal cardiac stress test 09/27/2002   Palpitations    PVCs and PACs per event monitor   Uveitis    sees Dr Peggyann Shoals at [facility] Dr Sherryll Burger now   Past Surgical History:  Procedure Laterality Date   APPENDECTOMY     CATARACT EXTRACTION     Dr Dione Booze , left eye/ 2 years ago   CATARACT EXTRACTION  03/16/2017   Right eye   COLONOSCOPY  07/11/2022   per Dr. Myrtie Neither, adenomatous polyp, no repeats due to age   GANGLION CYST EXCISION Right 11/29/2021   Procedure: RIGHT REMOVAL GANGLION OF WRIST;  Surgeon: Marlyne Beards, MD;  Location: [facility];  Service: Orthopedics;  Laterality: Right;   REVERSE SHOULDER ARTHROPLASTY Left 04/15/2023   per Dr. Francena Hanly   SHOULDER ARTHROSCOPY     both shoulders    TONSILLECTOMY     TONSILLECTOMY AND ADENOIDECTOMY     tubes in ears     55 years ago    Allergies  Allergies  Allergen Reactions   Contrast Media [Iodinated Contrast Media] Hives and Itching    Hives and itching after IV contrast injection   Red Dye #40 (Allura Red)  hives   Fluorescein Other (See Comments)   Ioxaglate Itching and Hives    Hives and itching after IV contrast injection    Home Medications    Prior to Admission medications   Medication Sig Start Date End Date Taking? Authorizing Provider  allopurinol (ZYLOPRIM) 100 MG tablet TAKE 1 TABLET(100 MG) BY MOUTH DAILY 09/11/23   Nelwyn Salisbury, MD  aspirin EC 81 MG tablet Take 1 tablet (81 mg total) by mouth daily. Swallow whole. 10/28/22   Leatha Gilding, MD  brimonidine (ALPHAGAN) 0.2 % ophthalmic solution Place 1 drop into both eyes as needed. Used 1 or 2 a year 08/13/19   [provider]  calcium-vitamin D (OSCAL WITH D) 500-200 MG-UNIT per  tablet Take 2 tablets by mouth daily.    [provider]  colchicine 0.6 MG tablet Take 1 tablet (0.6 mg total) by mouth every 6 (six) hours as needed (gout). 06/17/21   Nelwyn Salisbury, MD  CONTOUR NEXT TEST test strip USE TO TEST BLOOD SUGAR 3 TO 4 TIMES DAILY 01/30/23   Nelwyn Salisbury, MD  finasteride (PROSCAR) 5 MG tablet Take 1 tablet (5 mg total) by mouth daily. 09/07/23   Nelwyn Salisbury, MD  fluorouracil (EFUDEX) 5 % cream Apply topically. 06/21/22   [provider]  FOLIC ACID PO Take 10 mg by mouth daily.     [provider]  gabapentin (NEURONTIN) 300 MG capsule TAKE 1 CAPSULE(300 MG) BY MOUTH TWICE DAILY 08/08/22   Nelwyn Salisbury, MD  glipiZIDE (GLUCOTROL) 10 MG tablet TAKE 1 TABLET(10 MG) BY MOUTH TWICE DAILY BEFORE A MEAL 05/03/23   Nelwyn Salisbury, MD  HUMIRA PEN 40 MG/0.4ML PNKT SMARTSIG:40 Milligram(s) SUB-Q Every 2 Weeks 06/06/22   [provider]  lisinopril (ZESTRIL) 10 MG tablet TAKE 1 TABLET(10 MG) BY MOUTH DAILY 05/03/23   Nelwyn Salisbury, MD  MELATONIN PO Take 20 mg by mouth at bedtime. OTC    [provider]  meloxicam (MOBIC) 15 MG tablet Take 0.5 tablets (7.5 mg total) by mouth daily. 06/27/23   Nelwyn Salisbury, MD  methotrexate (RHEUMATREX) 2.5 MG tablet Take 4 tablets by mouth once a week. 01/07/20   [provider]  Microlet Lancets MISC USE TO TEST DAILY 04/19/23   Nelwyn Salisbury, MD  Multiple Vitamin (MULTIVITAMIN) tablet Take 1 tablet by mouth daily.    [provider]  oxyCODONE-acetaminophen (PERCOCET) 10-325 MG tablet Take 1 tablet by mouth every 4 (four) hours as needed for pain. 03/24/23   Nelwyn Salisbury, MD  pioglitazone (ACTOS) 30 MG tablet Take 1.5 tablets (45 mg total) by mouth daily. 01/19/23   Nelwyn Salisbury, MD  rosuvastatin (CRESTOR) 20 MG tablet TAKE 1 TABLET BY MOUTH DAILY 09/11/23   Nelwyn Salisbury, MD  timolol (TIMOPTIC) 0.5 % ophthalmic solution 1 drop 2 (two) times daily. 06/05/22   [provider]  traMADol (ULTRAM) 50 MG tablet Take 2 tablets (100 mg total) by mouth every 6 (six) hours as needed. 07/28/23   Nelwyn Salisbury, MD  zolpidem (AMBIEN) 10 MG tablet Take 1 tablet (10 mg total) by mouth at bedtime. 09/07/23   Nelwyn Salisbury, MD    Physical Exam    Vital Signs:  [name] [name]. does not have vital signs available for review today.  Given telephonic nature of communication, physical exam is limited. AAOx3. NAD. Normal affect.  Speech and respirations are unlabored.  Accessory  Clinical Findings    None  Assessment & Plan    1.  Preoperative Cardiovascular Risk Assessment: right reverse shoulder arthroplasty    Date of Surgery:  Clearance 11/14/23                                 Surgeon:  Dr. Francena Hanly Surgeon's Group or Practice Name:  [facility]  Fax number:  [phone]      Primary Cardiologist: Kristeen Miss, MD  Chart reviewed as part of pre-operative protocol coverage. Given past medical history and time since last visit, based on ACC/AHA guidelines, Keval Nam. would be at acceptable risk for the planned procedure without further cardiovascular testing.   His RCRI is high risk, greater than 11% risk of major cardiac event.  He is able to complete greater than 4 METS of physical activity.  Patient was advised that if he develops new symptoms prior to surgery to contact our office to arrange a follow-up appointment.  He verbalized understanding.  His aspirin is not prescribed by cardiology.  Recommendations for holding aspirin will need to come from prescribing provider.  I will route this recommendation to the requesting party via Epic fax function and remove from pre-op pool.       Time:   Today, I have spent 5 minutes with the patient with telehealth technology discussing medical history, symptoms, and management plan.     Ronney Asters, NP  10/13/2023, 7:11 AM

## 2023-10-19 DIAGNOSIS — H6123 Impacted cerumen, bilateral: Secondary | ICD-10-CM | POA: Diagnosis not present

## 2023-10-19 DIAGNOSIS — Z974 Presence of external hearing-aid: Secondary | ICD-10-CM | POA: Diagnosis not present

## 2023-10-20 ENCOUNTER — Other Ambulatory Visit: Payer: Self-pay | Admitting: Family Medicine

## 2023-10-23 ENCOUNTER — Telehealth: Payer: BC Managed Care – PPO

## 2023-10-23 DIAGNOSIS — M25512 Pain in left shoulder: Secondary | ICD-10-CM | POA: Diagnosis not present

## 2023-10-23 DIAGNOSIS — Z96612 Presence of left artificial shoulder joint: Secondary | ICD-10-CM | POA: Diagnosis not present

## 2023-10-24 DIAGNOSIS — M19011 Primary osteoarthritis, right shoulder: Secondary | ICD-10-CM | POA: Diagnosis not present

## 2023-10-24 DIAGNOSIS — M75121 Complete rotator cuff tear or rupture of right shoulder, not specified as traumatic: Secondary | ICD-10-CM | POA: Diagnosis not present

## 2023-10-24 DIAGNOSIS — G8918 Other acute postprocedural pain: Secondary | ICD-10-CM | POA: Diagnosis not present

## 2023-11-07 ENCOUNTER — Encounter: Payer: Self-pay | Admitting: Family Medicine

## 2023-11-07 ENCOUNTER — Ambulatory Visit: Payer: Self-pay | Admitting: Family Medicine

## 2023-11-07 DIAGNOSIS — Z96611 Presence of right artificial shoulder joint: Secondary | ICD-10-CM | POA: Diagnosis not present

## 2023-11-07 NOTE — Telephone Encounter (Signed)
 Copied from CRM [id number]. Topic: Clinical - Red Word Triage >> Nov 07, 2023 10:55 AM Deaijah H wrote: Red Word that prompted transfer to Nurse Triage: Issue with breathing on left side gotten worse  Chief Complaint: Difficulty breathing Symptoms: Dry cough, mild dizziness, mild burning sensation in chest Frequency: 1-2 weeks  Pertinent Negatives: Patient denies fever Disposition: [x] ED /[] Urgent Care (no appt availability in office) / [] Appointment(In office/virtual)/ []  [facility]/ [] Home Care/ [x] Refused Recommended Disposition /[] [facility]/ []  Follow-up with PCP Additional Notes: Patient called in to report difficulty breathing. Patient stated he had shoulder surgery 2 weeks ago. Patient stated difficulty breathing started about 1 week after the surgery. Patient stated that he experiences SOB when talking, moving around and coughing. Patient able to speak in complete sentences, with noticeable effort, while on the phone with this RN. Patient denied SOB while at rest and laying flat. Patient is also experiencing a dry cough, mild dizziness and a mild burning sensation in his chest. Patient denied fever and wheezing. This RN advised ED, per protocol and recent surgery. Patient refused recommendation. Patient requested an appointment in the office. This RN advised I would route this conversation to the clinic, for their discretion on scheduling. This RN notified [facility] of ED refusal.   Reason for Disposition  Major surgery in the past month  Answer Assessment - Initial Assessment Questions 1. RESPIRATORY STATUS: "Describe your breathing?" (e.g., wheezing, shortness of breath, unable to speak, severe coughing)      Difficulty breathing when talking and coughing, difficulty breathing when moving around, denies difficulty breathing at rest 2. ONSET: "When did this breathing problem begin?"      After surgery 1-2 weeks ago 3. PATTERN "Does the difficult breathing come and go, or  has it been constant since it started?"      States difficulty breathing is constant 4. SEVERITY: "How bad is your breathing?" (e.g., mild, moderate, severe)    - MILD: No SOB at rest, mild SOB with walking, speaks normally in sentences, can lie down, no retractions, pulse < 100.    - MODERATE: SOB at rest, SOB with minimal exertion and prefers to sit, cannot lie down flat, speaks in phrases, mild retractions, audible wheezing, pulse 100-120.    - SEVERE: Very SOB at rest, speaks in single words, struggling to breathe, sitting hunched forward, retractions, pulse > 120      Mild/moderate- states he is able to sleep at night flat, states he experiences SOB when talking and moving around, denies SOB at rest 5. RECURRENT SYMPTOM: "Have you had difficulty breathing before?" If Yes, ask: "When was the last time?" and "What happened that time?"      Denies 6. CARDIAC HISTORY: "Do you have any history of heart disease?" (e.g., heart attack, angina, bypass surgery, angioplasty)      Denies 7. LUNG HISTORY: "Do you have any history of lung disease?"  (e.g., pulmonary embolus, asthma, emphysema)     Walking pneumonia 10-12 years ago 8. CAUSE: "What do you think is causing the breathing problem?"      Denies 9. OTHER SYMPTOMS: "Do you have any other symptoms? (e.g., dizziness, runny nose, cough, chest pain, fever)     Dry cough, mild dizziness, mild burning sensation in chest, denies wheezing, denies fever 10. O2 SATURATION MONITOR:  "Do you use an oxygen saturation monitor (pulse oximeter) at home?" If Yes, ask: "What is your reading (oxygen level) today?" "What is your usual oxygen saturation reading?" (  e.g., 95%)     Denies  Protocols used: Breathing Difficulty-A-AH

## 2023-11-08 ENCOUNTER — Emergency Department (HOSPITAL_COMMUNITY)
Admission: EM | Admit: 2023-11-08 | Discharge: 2023-11-09 | Disposition: A | Discharge status: Home / Self Care | Payer: BC Managed Care – PPO | Attending: Emergency Medicine | Admitting: Emergency Medicine

## 2023-11-08 ENCOUNTER — Emergency Department (HOSPITAL_COMMUNITY): Payer: BC Managed Care – PPO

## 2023-11-08 ENCOUNTER — Other Ambulatory Visit: Payer: Self-pay

## 2023-11-08 DIAGNOSIS — R918 Other nonspecific abnormal finding of lung field: Secondary | ICD-10-CM | POA: Diagnosis not present

## 2023-11-08 DIAGNOSIS — I10 Essential (primary) hypertension: Secondary | ICD-10-CM | POA: Diagnosis not present

## 2023-11-08 DIAGNOSIS — Z7982 Long term (current) use of aspirin: Secondary | ICD-10-CM | POA: Diagnosis not present

## 2023-11-08 DIAGNOSIS — R0602 Shortness of breath: Secondary | ICD-10-CM | POA: Insufficient documentation

## 2023-11-08 DIAGNOSIS — E119 Type 2 diabetes mellitus without complications: Secondary | ICD-10-CM | POA: Diagnosis not present

## 2023-11-08 DIAGNOSIS — R079 Chest pain, unspecified: Secondary | ICD-10-CM | POA: Diagnosis not present

## 2023-11-08 DIAGNOSIS — R600 Localized edema: Secondary | ICD-10-CM | POA: Diagnosis not present

## 2023-11-08 DIAGNOSIS — Z7984 Long term (current) use of oral hypoglycemic drugs: Secondary | ICD-10-CM | POA: Insufficient documentation

## 2023-11-08 DIAGNOSIS — R791 Abnormal coagulation profile: Secondary | ICD-10-CM | POA: Insufficient documentation

## 2023-11-08 DIAGNOSIS — I2510 Atherosclerotic heart disease of native coronary artery without angina pectoris: Secondary | ICD-10-CM | POA: Diagnosis not present

## 2023-11-08 DIAGNOSIS — Z96611 Presence of right artificial shoulder joint: Secondary | ICD-10-CM | POA: Diagnosis not present

## 2023-11-08 DIAGNOSIS — Z96612 Presence of left artificial shoulder joint: Secondary | ICD-10-CM | POA: Diagnosis not present

## 2023-11-08 LAB — CBC
HCT: 40.8 % (ref 39.0–52.0)
Hemoglobin: 13.5 g/dL (ref 13.0–17.0)
MCH: 33.3 pg (ref 26.0–34.0)
MCHC: 33.1 g/dL (ref 30.0–36.0)
MCV: 100.7 fL — ABNORMAL HIGH (ref 80.0–100.0)
Platelets: 225 10*3/uL (ref 150–400)
RBC: 4.05 MIL/uL — ABNORMAL LOW (ref 4.22–5.81)
RDW: 15.6 % — ABNORMAL HIGH (ref 11.5–15.5)
WBC: 8.8 10*3/uL (ref 4.0–10.5)
nRBC: 0 % (ref 0.0–0.2)

## 2023-11-08 LAB — BASIC METABOLIC PANEL
Anion gap: 12 (ref 5–15)
BUN: 40 mg/dL — ABNORMAL HIGH (ref 8–23)
CO2: 21 mmol/L — ABNORMAL LOW (ref 22–32)
Calcium: 9.7 mg/dL (ref 8.9–10.3)
Chloride: 104 mmol/L (ref 98–111)
Creatinine, Ser: 1.41 mg/dL — ABNORMAL HIGH (ref 0.61–1.24)
GFR, Estimated: 51 mL/min — ABNORMAL LOW (ref >60)
Glucose, Bld: 162 mg/dL — ABNORMAL HIGH (ref 70–99)
Potassium: 4.5 mmol/L (ref 3.5–5.1)
Sodium: 137 mmol/L (ref 135–145)

## 2023-11-08 LAB — D-DIMER, QUANTITATIVE: D-Dimer, Quant: 2.46 ug{FEU}/mL — ABNORMAL HIGH (ref 0.00–0.50)

## 2023-11-08 LAB — TROPONIN I (HIGH SENSITIVITY)
Troponin I (High Sensitivity): 11 ng/L (ref <18)
Troponin I (High Sensitivity): 12 ng/L (ref <18)

## 2023-11-08 MED ORDER — METHYLPREDNISOLONE SODIUM SUCC 40 MG IJ SOLR
40.0000 mg | Freq: Once | INTRAMUSCULAR | Status: AC
  Administered 2023-11-09: 40 mg via INTRAVENOUS
  Filled 2023-11-08: qty 1

## 2023-11-08 MED ORDER — DIPHENHYDRAMINE HCL 50 MG/ML IJ SOLN
50.0000 mg | Freq: Once | INTRAMUSCULAR | Status: AC
  Administered 2023-11-09: 50 mg via INTRAVENOUS
  Filled 2023-11-08: qty 1

## 2023-11-08 NOTE — Telephone Encounter (Signed)
 I do think he should go to the ED for this because in this scenario I am worried about a possible pulmonary embolism ( a blood clot in the lungs). This is serious and he needs a chest CT angiogram to check for it

## 2023-11-08 NOTE — ED Provider Triage Note (Signed)
 Emergency Medicine Provider Triage Evaluation Note  [name] [name]. , a 78 y.o. male  was evaluated in triage.  Pt complains of shortness of breath.  Sent from PCPs office with concern for PE.  Reportedly had reverse shoulder replacement of his right shoulder 2 weeks ago.  1 week ago began having more dyspnea with exertion.  Does have some tightness in his chest.  No history of similar.  Does have a dry cough but no congestion.  Review of Systems  Positive:  Negative:   Physical Exam  BP 113/63 (BP Location: Left Arm)   Pulse (!) 49   Temp 98.7 F (37.1 C) (Oral)   Resp 16   SpO2 98%  Gen:   Awake, no distress  well appearing Resp:  Normal effort  MSK:   Moves extremities without difficulty  Other:    Medical Decision Making  Medically screening exam initiated at 8:08 PM.  Appropriate orders placed.  [name] [name]. was informed that the remainder of the evaluation will be completed by another provider, this initial triage assessment does not replace that evaluation, and the importance of remaining in the ED until their evaluation is complete.  Patient unfortunately has a contrast allergy, D-dimer ordered   [name] [name] 11/08/23 2009

## 2023-11-08 NOTE — Telephone Encounter (Signed)
Spoke with pt verbalized understanding of Dr Fry advise 

## 2023-11-08 NOTE — ED Notes (Signed)
 Wife at bedside.

## 2023-11-08 NOTE — ED Triage Notes (Signed)
 The pt is c/o some shortness of breath  since he had shoulder surgery 2 weeks ago  no leg swelling

## 2023-11-09 ENCOUNTER — Emergency Department (HOSPITAL_COMMUNITY): Payer: BC Managed Care – PPO

## 2023-11-09 DIAGNOSIS — R918 Other nonspecific abnormal finding of lung field: Secondary | ICD-10-CM | POA: Diagnosis not present

## 2023-11-09 DIAGNOSIS — I2510 Atherosclerotic heart disease of native coronary artery without angina pectoris: Secondary | ICD-10-CM | POA: Diagnosis not present

## 2023-11-09 DIAGNOSIS — R0602 Shortness of breath: Secondary | ICD-10-CM | POA: Diagnosis not present

## 2023-11-09 MED ORDER — IOHEXOL 350 MG/ML SOLN
75.0000 mL | Freq: Once | INTRAVENOUS | Status: AC | PRN
  Administered 2023-11-09: 75 mL via INTRAVENOUS

## 2023-11-09 MED ORDER — LEVOFLOXACIN IN D5W 750 MG/150ML IV SOLN
750.0000 mg | Freq: Once | INTRAVENOUS | Status: AC
Start: 2023-11-09 — End: 2023-11-09
  Administered 2023-11-09: 750 mg via INTRAVENOUS
  Filled 2023-11-09: qty 150

## 2023-11-09 MED ORDER — LEVOFLOXACIN 250 MG PO TABS
750.0000 mg | ORAL_TABLET | Freq: Every day | ORAL | 0 refills | Status: AC
Start: 2023-11-10 — End: 2023-11-14

## 2023-11-09 MED ORDER — SODIUM CHLORIDE 0.9 % IV SOLN
2.0000 g | Freq: Once | INTRAVENOUS | Status: AC
  Administered 2023-11-09: 2 g via INTRAVENOUS
  Filled 2023-11-09: qty 20

## 2023-11-09 NOTE — ED Provider Notes (Addendum)
 Shortness of breath since shoulder surgery. Pending CT scan.  Physical Exam  BP (!) 140/72   Pulse 68   Temp 98.1 F (36.7 C) (Oral)   Resp 18   Ht 6\' 1"  (1.854 m)   Wt 84.4 kg   SpO2 99%   BMI 24.55 kg/m   Physical Exam Vitals and nursing note reviewed.  Constitutional:      General: He is not in acute distress.    Appearance: He is well-developed.  HENT:     Head: Normocephalic and atraumatic.  Eyes:     Conjunctiva/sclera: Conjunctivae normal.  Cardiovascular:     Rate and Rhythm: Normal rate. Rhythm irregular.     Heart sounds: No murmur heard.    Comments: Irregularly irregular rate and rhythm Pulmonary:     Effort: Pulmonary effort is normal. No respiratory distress.     Breath sounds: Normal breath sounds.     Comments: Lungs clear to auscultation bilaterally, talking in full sentences Abdominal:     Palpations: Abdomen is soft.     Tenderness: There is no abdominal tenderness.  Musculoskeletal:        General: No swelling.     Cervical back: Neck supple.     Comments: Mild 1+ pitting edema of the ankles bilaterally No calf tenderness to palpation bilaterally  Skin:    General: Skin is warm and dry.     Capillary Refill: Capillary refill takes less than 2 seconds.     Findings: No rash.  Neurological:     Mental Status: He is alert.  Psychiatric:        Mood and Affect: Mood normal.     Procedures  Procedures  ED Course / MDM   Clinical Course as of 11/09/23 0934  Thu Nov 09, 2023  3300 This is a 78 year old male presenting to the ED with shortness of breath.  Patient had a shoulder surgery recently and had transient shortness of breath afterwards but then worsening in the past 6 days.  Pacifically reports a dry cough and dyspnea on exertion.  Denies fevers or chills.  Denies any evident leg swelling.  He does not have any history of significant congestive heart failure.  Patient was observed for 13 hours in the ED overnight for his workup.  He remained  stable on room air.  Blood pressure and vital signs otherwise stable.  His D-dimer was elevated and subsequent CT PE study showed no acute PE, but question atelectasis versus potential atypical lower lobe infection.  White blood cell count normal.  Troponin negative.  Patient and premedicated with Benadryl and steroids prior to his scan for iodine contrast allergies, but did not develop hives or allergic reaction.  He is stable on my reassessment.  He is breathing more comfortably.  I do think is reasonable to treat for potential atypical infection I will treat with Rocephin and doxycycline in the ED and discharged on doxycycline at home.  I do not suspect this is consistent with congestive heart failure and do not believe he needs diuresis at this time.  I do not hear audible wheezing nor does he have history of COPD to suggest needing DuoNeb treatments.  He is not acutely anemic.  I believe he is stable for discharge and he and his wife are comfortable going home at this time. [MT]  7622 Update - allergies to azithromycin and doxycycline flagged for red dye contraindicates - will do levaquin 750 mg daily x 5 days [MT]  Clinical Course User Index [MT] Renaye Rakers Kermit Balo, MD   Medical Decision Making Risk Prescription drug management.    Patient received at signout.  In short, he is coming in for 6 days of feeling more short of breath.  Vital signs stable. Serial troponins stable.  EKG with baseline A-fib. Low concern for ACS.  CBC and BMP unremarkable.  There was concern for PE given recent surgery and elevated D-dimer, however, CTA chest negative for PE. He is not having any significant lower extremity edema, last echo from  2021 with EF 60-65%, lower concern for heart failure at this time. CTA chest showed bilateral lung base opacities, thought to resemble more atelectasis than infection.  However, since workup otherwise reassuring, will treat for CAP.  Initially was going to start patient on  doxycycline.  However, he does have a red dye allergy that resulted in hives.  Will change antibiotic to course of Levaquin.  He was given first dose of Levaquin and ceftriaxone here.   Patient stable and appropriate for discharge home with PCP follow up within the next week. Return precautions given.            Arabella Merles, PA-C 11/09/23 0934    Arabella Merles, PA-C 11/09/23 1610    Terald Sleeper, MD 11/09/23 [phone]

## 2023-11-09 NOTE — ED Provider Notes (Signed)
 [place] EMERGENCY DEPARTMENT AT [facility] Provider Note   CSN: [id number] Arrival date & time: 11/08/23  1854     History  Chief Complaint  Patient presents with   Shortness of Breath    [name] [name]. is a 78 y.o. male.  The history is provided by the patient and medical records.  Shortness of Breath  78 y.o. M with hx of gout, HLP, HTN, arthritis, DM, presenting to the ED with shortness of breath.  Patient had right reverse shoulder replacement 2 weeks ago with Dr. Rennis Chris.  Procedure went well without any noted complications.  Started having shortness of breath about a week ago, more so when up and moving about.  He denies any chest pain.  He notified Dr. Rennis Chris as well as his PCP yesterday and both encouraged him to come to the ED for evaluation of possible PE.  He has no history of DVT or PE previously.  He denies any leg/calf pain, however wife was concerned his ankles may be a little swollen.  Home Medications Prior to Admission medications   Medication Sig Start Date End Date Taking? Authorizing Provider  allopurinol (ZYLOPRIM) 100 MG tablet TAKE 1 TABLET(100 MG) BY MOUTH DAILY 09/11/23   Nelwyn Salisbury, MD  aspirin EC 81 MG tablet Take 1 tablet (81 mg total) by mouth daily. Swallow whole. 10/28/22   Leatha Gilding, MD  brimonidine (ALPHAGAN) 0.2 % ophthalmic solution Place 1 drop into both eyes as needed. Used 1 or 2 a year 08/13/19   [provider]  calcium-vitamin D (OSCAL WITH D) 500-200 MG-UNIT per tablet Take 2 tablets by mouth daily.    [provider]  colchicine 0.6 MG tablet Take 1 tablet (0.6 mg total) by mouth every 6 (six) hours as needed (gout). 06/17/21   Nelwyn Salisbury, MD  CONTOUR NEXT TEST test strip USE TO TEST BLOOD SUGAR 3 TO 4 TIMES DAILY 01/30/23   Nelwyn Salisbury, MD  finasteride (PROSCAR) 5 MG tablet Take 1 tablet (5 mg total) by mouth daily. 09/07/23   Nelwyn Salisbury, MD  fluorouracil (EFUDEX) 5 % cream Apply  topically. 06/21/22   [provider]  FOLIC ACID PO Take 10 mg by mouth daily.     [provider]  gabapentin (NEURONTIN) 300 MG capsule TAKE 1 CAPSULE(300 MG) BY MOUTH TWICE DAILY 08/08/22   Nelwyn Salisbury, MD  glipiZIDE (GLUCOTROL) 10 MG tablet TAKE 1 TABLET(10 MG) BY MOUTH TWICE DAILY BEFORE A MEAL 05/03/23   Nelwyn Salisbury, MD  HUMIRA PEN 40 MG/0.4ML PNKT SMARTSIG:40 Milligram(s) SUB-Q Every 2 Weeks 06/06/22   [provider]  lisinopril (ZESTRIL) 10 MG tablet TAKE 1 TABLET(10 MG) BY MOUTH DAILY 05/03/23   Nelwyn Salisbury, MD  MELATONIN PO Take 20 mg by mouth at bedtime. OTC    [provider]  meloxicam (MOBIC) 15 MG tablet Take 0.5 tablets (7.5 mg total) by mouth daily. 06/27/23   Nelwyn Salisbury, MD  methotrexate (RHEUMATREX) 2.5 MG tablet Take 4 tablets by mouth once a week. 01/07/20   [provider]  Microlet Lancets MISC USE TO TEST DAILY 10/27/23   Nelwyn Salisbury, MD  Multiple Vitamin (MULTIVITAMIN) tablet Take 1 tablet by mouth daily.    [provider]  oxyCODONE-acetaminophen (PERCOCET) 10-325 MG tablet Take 1 tablet by mouth every 4 (four) hours as needed for pain. 03/24/23   Nelwyn Salisbury, MD  pioglitazone (ACTOS) 30  MG tablet Take 1.5 tablets (45 mg total) by mouth daily. 01/19/23   Nelwyn Salisbury, MD  rosuvastatin (CRESTOR) 20 MG tablet TAKE 1 TABLET BY MOUTH DAILY 09/11/23   Nelwyn Salisbury, MD  timolol (TIMOPTIC) 0.5 % ophthalmic solution 1 drop 2 (two) times daily. 06/05/22   [provider]  traMADol (ULTRAM) 50 MG tablet Take 2 tablets (100 mg total) by mouth every 6 (six) hours as needed. 07/28/23   Nelwyn Salisbury, MD  zolpidem (AMBIEN) 10 MG tablet Take 1 tablet (10 mg total) by mouth at bedtime. 09/07/23   Nelwyn Salisbury, MD      Allergies    Contrast media [iodinated contrast media], Red dye #40 (allura red), Fluorescein, and Ioxaglate    Review of Systems   Review of Systems  Respiratory:  Positive for  shortness of breath.   All other systems reviewed and are negative.   Physical Exam Updated Vital Signs BP (!) 140/57 (BP Location: Right Arm)   Pulse 65   Temp 99.5 F (37.5 C) (Oral)   Resp 20   Ht 6\' 1"  (1.854 m)   Wt 84.4 kg   SpO2 98%   BMI 24.55 kg/m   Physical Exam Vitals and nursing note reviewed.  Constitutional:      Appearance: He is well-developed.  HENT:     Head: Normocephalic and atraumatic.  Eyes:     Conjunctiva/sclera: Conjunctivae normal.     Pupils: Pupils are equal, round, and reactive to light.  Cardiovascular:     Rate and Rhythm: Normal rate and regular rhythm.     Heart sounds: Normal heart sounds.  Pulmonary:     Effort: Pulmonary effort is normal.     Breath sounds: Normal breath sounds.     Comments: Speaking in full sentences, no acute distress, oxygen maintained at 100% during conversation Abdominal:     General: Bowel sounds are normal.     Palpations: Abdomen is soft.  Musculoskeletal:        General: Normal range of motion.     Cervical back: Normal range of motion.     Comments: No calf tenderness or asymmetry appreciated, no significant lower extremity edema Right shoulder incision is clean and appears to be healing well, there is no drainage or surrounding erythema  Skin:    General: Skin is warm and dry.  Neurological:     Mental Status: He is alert and oriented to person, place, and time.     ED Results / Procedures / Treatments   Labs (all labs ordered are listed, but only abnormal results are displayed) Labs Reviewed  BASIC METABOLIC PANEL - Abnormal; Notable for the following components:      Result Value   CO2 21 (*)    Glucose, Bld 162 (*)    BUN 40 (*)    Creatinine, Ser 1.41 (*)    GFR, Estimated 51 (*)    All other components within normal limits  CBC - Abnormal; Notable for the following components:   RBC 4.05 (*)    MCV 100.7 (*)    RDW 15.6 (*)    All other components within normal limits  D-DIMER,  QUANTITATIVE - Abnormal; Notable for the following components:   D-Dimer, Quant 2.46 (*)    All other components within normal limits  TROPONIN I (HIGH SENSITIVITY)  TROPONIN I (HIGH SENSITIVITY)    EKG None  Radiology DG Chest 2 View Result Date: 11/08/2023 CLINICAL DATA:  Chest  pain and shortness of breath. EXAM: CHEST - 2 VIEW COMPARISON:  None Available. FINDINGS: Normal sized heart. Clear lungs with normal vascularity. Bilateral shoulder prostheses. Mild lower thoracic spine degenerative changes. IMPRESSION: No acute abnormality. Electronically Signed   By: Beckie Salts M.D.   On: 11/08/2023 20:28    Procedures Procedures    Medications Ordered in ED Medications  methylPREDNISolone sodium succinate (SOLU-MEDROL) 40 mg/mL injection 40 mg (has no administration in time range)  diphenhydrAMINE (BENADRYL) injection 50 mg (has no administration in time range)    ED Course/ Medical Decision Making/ A&P                                 Medical Decision Making  78 year old male presenting to the ED with shortness of breath.  He is status post right reverse shoulder replacement 2 weeks ago with Dr. Rennis Chris.  Has been feeling short of breath for a week, mostly with exertion.  He is afebrile and nontoxic in appearance here.  His incision appears to be healing well.  He is hemodynamically stable on room air.  He is in no acute respiratory distress.  No calf pain or swelling.  Workup initiated in triage--no leukocytosis or significant electrolyte derangement.  Serum creatinine appears at baseline.  Troponin is normal.  D-dimer is elevated.  Patient unfortunately with contrast allergy (hives).  Premedication has been ordered, will obtain CTA.  6:24 AM Pre-medications were delayed.  CTA will not be done until after 7am.  Care will be signed out to oncoming provider.    Final Clinical Impression(s) / ED Diagnoses Final diagnoses:  Shortness of breath    Rx / DC Orders ED Discharge  Orders     None         Garlon Hatchet, PA-C 11/09/23 [id number]    Sabas Sous, MD 11/09/23 [phone]

## 2023-11-09 NOTE — Discharge Instructions (Addendum)
 Your workup today is reassuring. It is very unlikely that your shortness of breath is due to an issue in your heart.  Your cardiac enzyme (troponin) was normal today. Your EKG which is a measure of the heart's electrical activity and rhythm does show A-fib with some PVCs (premature ventricular complexes).  Please make your cardiologist aware of these findings.  Your chest x-ray is normal today.  The CT of your chest does not show any signs of a blood clot in your lungs. It does show some opacities which may be a result of not taking a deep breath or could be a sign of pneumonia.  Have included the CT results below.  Please make your PCP aware of these findings.  As discussed, you are being treated for a possible pneumonia.  You were given your first dose of 2 antibiotics, ceftriaxone and levofloxacin, here today.  You will need to continue taking the levofloxacin at home.  Please take your next dose tomorrow as prescribed.  This antibiotic can cause you to develop a severe diarrheal infection called C. difficile.  If you begin to have profuse green and foul-smelling diarrhea, please make your PCP aware soon as possible.  Please follow-up with your PCP within the next week for a recheck of your symptoms.  Return to the ER if you have any shortness of breath, difficulty breathing, worsening chest pain, dizziness, jaw pain, left arm or shoulder pain, abdominal pain, unexplained fever, any other new or concerning symptoms.   "EXAM: CT ANGIOGRAPHY CHEST WITH CONTRAST   TECHNIQUE: Multidetector CT imaging of the chest was performed using the standard protocol during bolus administration of intravenous contrast. Multiplanar CT image reconstructions and MIPs were obtained to evaluate the vascular anatomy.   RADIATION DOSE REDUCTION: This exam was performed according to the departmental dose-optimization program which includes automated exposure control, adjustment of the mA and/or kV according  to patient size and/or use of iterative reconstruction technique.   CONTRAST:  75mL OMNIPAQUE IOHEXOL 350 MG/ML SOLN   COMPARISON:  Chest radiographs yesterday. CT Abdomen and Pelvis 11/29/2013.   FINDINGS: Cardiovascular: Good contrast bolus timing in the pulmonary arterial tree. No pulmonary artery filling defect identified.   Calcified coronary artery atherosclerosis. Calcified aortic atherosclerosis. No contrast in the aorta. Heart size is at the upper limits of normal. No pericardial effusion.   Mediastinum/Nodes: Negative. No mediastinal mass or lymphadenopathy.   Lungs/Pleura: Major airways are patent. Symmetric mild dependent and lung base opacity more resembling atelectasis than infection. No pleural effusion. Mild pulmonary mosaic attenuation otherwise, more conspicuous in the right lung.   Upper Abdomen: Negative visible noncontrast upper abdominal viscera.   Musculoskeletal: Bilateral shoulder arthroplasty with streak artifact. Underlying osteopenia. Chronic left posterior 7th rib fracture. No acute or suspicious osseous lesion identified.   Review of the MIP images confirms the above findings.   IMPRESSION: 1. Negative for acute pulmonary embolus. 2. Bilateral lung base opacity is symmetric and more resembles atelectasis than infection. No pleural effusion. 3. Calcified coronary, Aortic Atherosclerosis (ICD10-I70.0).     Electronically Signed   By: Odessa Fleming M.D.   On: 11/09/2023 07:52"

## 2023-11-09 NOTE — Telephone Encounter (Signed)
 He was seen in the ED yesterday

## 2023-11-14 DIAGNOSIS — Z79899 Other long term (current) drug therapy: Secondary | ICD-10-CM | POA: Diagnosis not present

## 2023-11-14 DIAGNOSIS — H30033 Focal chorioretinal inflammation, peripheral, bilateral: Secondary | ICD-10-CM | POA: Diagnosis not present

## 2023-11-16 ENCOUNTER — Telehealth: Payer: Self-pay

## 2023-11-16 DIAGNOSIS — M79604 Pain in right leg: Secondary | ICD-10-CM

## 2023-11-16 DIAGNOSIS — R7989 Other specified abnormal findings of blood chemistry: Secondary | ICD-10-CM

## 2023-11-16 NOTE — Telephone Encounter (Signed)
 Received following message via secure chat from Dr Elease Hashimoto just now:  Al texted me with pain in his right leg . he has had recent shoulder surgery and has been been inactive. had some dyspnea the other day and went to the ER. D-dimer was elevated. CTA of the lung was negative for PE. now he has developed right leg pain . has not strained it . did not get getter with massage. I think he needs a venous duplex of his right leg. if it is positive, he will need eliquis 10 mg BID for 7 days followed by 5 mg BID for 3-6 months. will you see if we can get one at [facility] or [facility] or somewhere. thank you.   Stat order placed for DVT study. Routed to PCP o ensure follow-through on scheduling soon.

## 2023-11-20 ENCOUNTER — Encounter: Payer: Self-pay | Admitting: Family Medicine

## 2023-11-20 ENCOUNTER — Telehealth (INDEPENDENT_AMBULATORY_CARE_PROVIDER_SITE_OTHER): Admitting: Family Medicine

## 2023-11-20 DIAGNOSIS — M79604 Pain in right leg: Secondary | ICD-10-CM

## 2023-11-20 DIAGNOSIS — M50321 Other cervical disc degeneration at C4-C5 level: Secondary | ICD-10-CM | POA: Diagnosis not present

## 2023-11-20 DIAGNOSIS — M50322 Other cervical disc degeneration at C5-C6 level: Secondary | ICD-10-CM | POA: Diagnosis not present

## 2023-11-20 DIAGNOSIS — M51371 Other intervertebral disc degeneration, lumbosacral region with lower extremity pain only: Secondary | ICD-10-CM | POA: Diagnosis not present

## 2023-11-20 DIAGNOSIS — M9903 Segmental and somatic dysfunction of lumbar region: Secondary | ICD-10-CM | POA: Diagnosis not present

## 2023-11-20 DIAGNOSIS — R0602 Shortness of breath: Secondary | ICD-10-CM

## 2023-11-20 DIAGNOSIS — D649 Anemia, unspecified: Secondary | ICD-10-CM | POA: Diagnosis not present

## 2023-11-20 NOTE — Progress Notes (Signed)
 Subjective:    Patient ID: [name] [name]., male    DOB: 1946-01-16, 78 y.o.   MRN: [id number]  HPI Virtual Visit via Video Note  I connected with the patient on 11/20/23 at  3:30 PM EDT by a video enabled telemedicine application and verified that I am speaking with the correct person using two identifiers.  Location patient: home Location provider:work or home office Persons participating in the virtual visit: patient, provider  I discussed the limitations of evaluation and management by telemedicine and the availability of in person appointments. The patient expressed understanding and agreed to proceed.   HPI: Here to follow up on a visit to the ED on 11-08-23. He presented with SOB that had started earlier that day. No chest pain. No swelling or pain in the legs. His exam was normal. A D dimer was elevated, so a chest CTA was ordered. This was negative for any PE's and was normal. EKG was normal. He was observed for about 12 hours and his SOB went away. Of note he takes 81 mg of ASA daily. He has had no SOB since that day, but about 5 days ago he developed a mild pain in the right calf. No recent trauma. No swelling in the leg or foot. He has leg cramps off and on, buthe says this did not feel like a cramp. After 3 days, the pain went away and he has been pain free for several days. He contacted his cardiologist, Dr. Elease Hashimoto, and he has ordered venous doppler on the leg which is scheduled for 11-27-23. In addition he had some labs drawn by his nephrologist on 11-14-23 showing a Hgb of 12.3 and an MCV of 100.9. his renal function is stable.    ROS: See pertinent positives and negatives per HPI.  Past Medical History:  Diagnosis Date   Arthritis    Cataract    Bil/ right eye surgery   Diabetes mellitus    type II   Diverticulitis of colon    ED (erectile dysfunction)    Gout    no meds   H/O echocardiogram 2013   normal    History of irregular heartbeat    Per pt, heart  skips beats at times/had Echo   Hyperlipidemia    Hypertension    Iritis, recurrent     sees Dr. Eustaquio Maize at [facility]    Normal cardiac stress test 09/27/2002   Palpitations    PVCs and PACs per event monitor   Uveitis    sees Dr Peggyann Shoals at [facility] Dr Sherryll Burger now    Past Surgical History:  Procedure Laterality Date   APPENDECTOMY     CATARACT EXTRACTION     Dr Dione Booze , left eye/ 2 years ago   CATARACT EXTRACTION  03/16/2017   Right eye   COLONOSCOPY  07/11/2022   per Dr. Myrtie Neither, adenomatous polyp, no repeats due to age   GANGLION CYST EXCISION Right 11/29/2021   Procedure: RIGHT REMOVAL GANGLION OF WRIST;  Surgeon: Marlyne Beards, MD;  Location: [facility];  Service: Orthopedics;  Laterality: Right;   REVERSE SHOULDER ARTHROPLASTY Left 04/15/2023   per Dr. Francena Hanly   SHOULDER ARTHROSCOPY     both shoulders    TONSILLECTOMY     TONSILLECTOMY AND ADENOIDECTOMY     tubes in ears     55 years ago    Family History  Problem Relation Age of Onset   Thyroid cancer Mother  Heart disease Mother    Heart attack Father 25   Kidney disease Father    Heart disease Father    Thyroid cancer Brother    Heart attack Paternal Grandmother    Diabetes Paternal Grandfather    Stomach cancer Neg Hx    Colon polyps Neg Hx    Esophageal cancer Neg Hx    Rectal cancer Neg Hx      Current Outpatient Medications:    allopurinol (ZYLOPRIM) 100 MG tablet, TAKE 1 TABLET(100 MG) BY MOUTH DAILY, Disp: 90 tablet, Rfl: 0   aspirin EC 81 MG tablet, Take 1 tablet (81 mg total) by mouth daily. Swallow whole., Disp: 30 tablet, Rfl: 12   brimonidine (ALPHAGAN) 0.2 % ophthalmic solution, Place 1 drop into both eyes as needed. Used 1 or 2 a year, Disp: , Rfl:    calcium-vitamin D (OSCAL WITH D) 500-200 MG-UNIT per tablet, Take 2 tablets by mouth daily., Disp: , Rfl:    colchicine 0.6 MG tablet, Take 1 tablet (0.6 mg total) by mouth every 6 (six) hours as needed (gout)., Disp: 60  tablet, Rfl: 5   CONTOUR NEXT TEST test strip, USE TO TEST BLOOD SUGAR 3 TO 4 TIMES DAILY, Disp: 300 strip, Rfl: 0   finasteride (PROSCAR) 5 MG tablet, Take 1 tablet (5 mg total) by mouth daily., Disp: 90 tablet, Rfl: 3   fluorouracil (EFUDEX) 5 % cream, Apply topically., Disp: , Rfl:    FOLIC ACID PO, Take 10 mg by mouth daily. , Disp: , Rfl:    gabapentin (NEURONTIN) 300 MG capsule, TAKE 1 CAPSULE(300 MG) BY MOUTH TWICE DAILY, Disp: 180 capsule, Rfl: 3   glipiZIDE (GLUCOTROL) 10 MG tablet, TAKE 1 TABLET(10 MG) BY MOUTH TWICE DAILY BEFORE A MEAL, Disp: 180 tablet, Rfl: 3   HUMIRA PEN 40 MG/0.4ML PNKT, SMARTSIG:40 Milligram(s) SUB-Q Every 2 Weeks, Disp: , Rfl:    lisinopril (ZESTRIL) 10 MG tablet, TAKE 1 TABLET(10 MG) BY MOUTH DAILY, Disp: 90 tablet, Rfl: 3   MELATONIN PO, Take 20 mg by mouth at bedtime. OTC, Disp: , Rfl:    meloxicam (MOBIC) 15 MG tablet, Take 0.5 tablets (7.5 mg total) by mouth daily., Disp: , Rfl:    methotrexate (RHEUMATREX) 2.5 MG tablet, Take 4 tablets by mouth once a week., Disp: , Rfl:    Microlet Lancets MISC, USE TO TEST DAILY, Disp: 100 each, Rfl: 1   Multiple Vitamin (MULTIVITAMIN) tablet, Take 1 tablet by mouth daily., Disp: , Rfl:    pioglitazone (ACTOS) 30 MG tablet, Take 1.5 tablets (45 mg total) by mouth daily., Disp: 90 tablet, Rfl: 3   rosuvastatin (CRESTOR) 20 MG tablet, TAKE 1 TABLET BY MOUTH DAILY, Disp: 90 tablet, Rfl: 1   timolol (TIMOPTIC) 0.5 % ophthalmic solution, 1 drop 2 (two) times daily., Disp: , Rfl:    traMADol (ULTRAM) 50 MG tablet, Take 2 tablets (100 mg total) by mouth every 6 (six) hours as needed., Disp: 60 tablet, Rfl: 5   zolpidem (AMBIEN) 10 MG tablet, Take 1 tablet (10 mg total) by mouth at bedtime., Disp: 90 tablet, Rfl: 1  EXAM:  VITALS per patient if applicable:  GENERAL: alert, oriented, appears well and in no acute distress  HEENT: atraumatic, conjunttiva clear, no obvious abnormalities on inspection of external nose and  ears  NECK: normal movements of the head and neck  LUNGS: on inspection no signs of respiratory distress, breathing rate appears normal, no obvious gross SOB, gasping or wheezing  CV:  no obvious cyanosis  MS: moves all visible extremities without noticeable abnormality  PSYCH/NEURO: pleasant and cooperative, no obvious depression or anxiety, speech and thought processing grossly intact  ASSESSMENT AND PLAN: He had an episode of SOB about 2 weeks ago, but this resolved. No clear etiology was found. Then he had 3 days of pain in the right calf, so he will get an [place] as above to rule out a DVT. Finally we will have Al come by our lab for an anemia workup. We spent a total of (33   ) minutes reviewing records and discussing these issues.  Gershon Crane, MD  Discussed the following assessment and plan:  No diagnosis found.     I discussed the assessment and treatment plan with the patient. The patient was provided an opportunity to ask questions and all were answered. The patient agreed with the plan and demonstrated an understanding of the instructions.   The patient was advised to call back or seek an in-person evaluation if the symptoms worsen or if the condition fails to improve as anticipated.      Review of Systems     Objective:   Physical Exam        Assessment & Plan:

## 2023-11-21 DIAGNOSIS — E119 Type 2 diabetes mellitus without complications: Secondary | ICD-10-CM | POA: Diagnosis not present

## 2023-11-21 DIAGNOSIS — Z961 Presence of intraocular lens: Secondary | ICD-10-CM | POA: Diagnosis not present

## 2023-11-21 DIAGNOSIS — H30033 Focal chorioretinal inflammation, peripheral, bilateral: Secondary | ICD-10-CM | POA: Diagnosis not present

## 2023-11-21 DIAGNOSIS — H3581 Retinal edema: Secondary | ICD-10-CM | POA: Diagnosis not present

## 2023-11-23 ENCOUNTER — Other Ambulatory Visit (INDEPENDENT_AMBULATORY_CARE_PROVIDER_SITE_OTHER)

## 2023-11-23 DIAGNOSIS — D649 Anemia, unspecified: Secondary | ICD-10-CM | POA: Diagnosis not present

## 2023-11-23 LAB — CBC WITH DIFFERENTIAL/PLATELET
Basophils Absolute: 0 10*3/uL (ref 0.0–0.1)
Basophils Relative: 0.6 % (ref 0.0–3.0)
Eosinophils Absolute: 0.2 10*3/uL (ref 0.0–0.7)
Eosinophils Relative: 3.1 % (ref 0.0–5.0)
HCT: 36.9 % — ABNORMAL LOW (ref 39.0–52.0)
Hemoglobin: 12.6 g/dL — ABNORMAL LOW (ref 13.0–17.0)
Lymphocytes Relative: 22.5 % (ref 12.0–46.0)
Lymphs Abs: 1.3 10*3/uL (ref 0.7–4.0)
MCHC: 34.1 g/dL (ref 30.0–36.0)
MCV: 100.9 fl — ABNORMAL HIGH (ref 78.0–100.0)
Monocytes Absolute: 0.5 10*3/uL (ref 0.1–1.0)
Monocytes Relative: 9.1 % (ref 3.0–12.0)
Neutro Abs: 3.8 10*3/uL (ref 1.4–7.7)
Neutrophils Relative %: 64.7 % (ref 43.0–77.0)
Platelets: 132 10*3/uL — ABNORMAL LOW (ref 150.0–400.0)
RBC: 3.66 Mil/uL — ABNORMAL LOW (ref 4.22–5.81)
RDW: 16.1 % — ABNORMAL HIGH (ref 11.5–15.5)
WBC: 5.9 10*3/uL (ref 4.0–10.5)

## 2023-11-23 LAB — IBC + FERRITIN
Ferritin: 79.1 ng/mL (ref 22.0–322.0)
Iron: 66 ug/dL (ref 42–165)
Saturation Ratios: 20.7 % (ref 20.0–50.0)
TIBC: 319.2 ug/dL (ref 250.0–450.0)
Transferrin: 228 mg/dL (ref 212.0–360.0)

## 2023-11-23 LAB — FOLATE: Folate: >25.2 ng/mL (ref >5.9)

## 2023-11-23 LAB — VITAMIN B12: Vitamin B-12: 541 pg/mL (ref 211–911)

## 2023-11-25 ENCOUNTER — Other Ambulatory Visit: Payer: Self-pay | Admitting: Family Medicine

## 2023-11-27 ENCOUNTER — Ambulatory Visit (HOSPITAL_COMMUNITY)
Admission: RE | Admit: 2023-11-27 | Discharge: 2023-11-27 | Disposition: A | Discharge status: Home / Self Care | Source: Ambulatory Visit | Attending: Cardiovascular Disease | Admitting: Cardiovascular Disease

## 2023-11-27 ENCOUNTER — Encounter: Payer: Self-pay | Admitting: Cardiovascular Disease

## 2023-11-27 DIAGNOSIS — M79604 Pain in right leg: Secondary | ICD-10-CM

## 2023-11-27 DIAGNOSIS — M25511 Pain in right shoulder: Secondary | ICD-10-CM | POA: Diagnosis not present

## 2023-11-27 DIAGNOSIS — R7989 Other specified abnormal findings of blood chemistry: Secondary | ICD-10-CM | POA: Diagnosis not present

## 2023-11-27 DIAGNOSIS — M25811 Other specified joint disorders, right shoulder: Secondary | ICD-10-CM | POA: Diagnosis not present

## 2023-12-06 ENCOUNTER — Other Ambulatory Visit: Payer: Self-pay | Admitting: Family Medicine

## 2023-12-06 DIAGNOSIS — M25511 Pain in right shoulder: Secondary | ICD-10-CM | POA: Diagnosis not present

## 2023-12-06 DIAGNOSIS — M25811 Other specified joint disorders, right shoulder: Secondary | ICD-10-CM | POA: Diagnosis not present

## 2023-12-11 DIAGNOSIS — M25511 Pain in right shoulder: Secondary | ICD-10-CM | POA: Diagnosis not present

## 2023-12-11 DIAGNOSIS — M25811 Other specified joint disorders, right shoulder: Secondary | ICD-10-CM | POA: Diagnosis not present

## 2023-12-13 ENCOUNTER — Ambulatory Visit: Payer: Self-pay | Admitting: *Deleted

## 2023-12-13 DIAGNOSIS — M9903 Segmental and somatic dysfunction of lumbar region: Secondary | ICD-10-CM | POA: Diagnosis not present

## 2023-12-13 DIAGNOSIS — M50321 Other cervical disc degeneration at C4-C5 level: Secondary | ICD-10-CM | POA: Diagnosis not present

## 2023-12-13 DIAGNOSIS — M51371 Other intervertebral disc degeneration, lumbosacral region with lower extremity pain only: Secondary | ICD-10-CM | POA: Diagnosis not present

## 2023-12-13 DIAGNOSIS — M50322 Other cervical disc degeneration at C5-C6 level: Secondary | ICD-10-CM | POA: Diagnosis not present

## 2023-12-13 NOTE — Telephone Encounter (Signed)
 FYI Pt has appointment on 12/19/23 for this problem

## 2023-12-13 NOTE — Telephone Encounter (Signed)
 Copied from CRM [id number]. Topic: Clinical - Red Word Triage >> Dec 13, 2023 12:00 PM Gurney Maxin H wrote: [facility] that prompted transfer to Nurse Triage: Left foot hurts, hard to walk Reason for Disposition  [1] MODERATE pain (e.g., interferes with normal activities, limping) AND [2] present > 3 days  Answer Assessment - Initial Assessment Questions 1. ONSET: "When did the pain start?"      I'm having pain in left foot.   I think it's neuropathy.   I've have issues with this before. 2. LOCATION: "Where is the pain located?"      It's more towards the pads the pad before the toes. 3. PAIN: "How bad is the pain?"    (Scale 1-10; or mild, moderate, severe)  - MILD (1-3): doesn't interfere with normal activities.   - MODERATE (4-7): interferes with normal activities (e.g., work or school) or awakens from sleep, limping.   - SEVERE (8-10): excruciating pain, unable to do any normal activities, unable to walk.      Moderate 4. WORK OR EXERCISE: "Has there been any recent work or exercise that involved this part of the body?"      No 5. CAUSE: "What do you think is causing the foot pain?"     Neuropathy    6. OTHER SYMPTOMS: "Do you have any other symptoms?" (e.g., leg pain, rash, fever, numbness)     No 7. PREGNANCY: "Is there any chance you are pregnant?" "When was your last menstrual period?"     N/A  Protocols used: Foot Pain-A-AH  Chief Complaint: Left foot pain from possible neuropathy.   Has had issues with this in the past. Symptoms: pain towards toes and in the pad before the toes mostly but whole foot involved. Frequency: "For a while now" Pertinent Negatives: Patient denies injuries or falls Disposition: [] ED /[] Urgent Care (no appt availability in office) / [x] Appointment(In office/virtual)/ []  [facility]/ [] Home Care/ [] Refused Recommended Disposition /[] [facility]/ []  Follow-up with PCP Additional Notes: Appt made with Dr. Clent Ridges for 12/19/2023.

## 2023-12-18 DIAGNOSIS — D045 Carcinoma in situ of skin of trunk: Secondary | ICD-10-CM | POA: Diagnosis not present

## 2023-12-18 DIAGNOSIS — L821 Other seborrheic keratosis: Secondary | ICD-10-CM | POA: Diagnosis not present

## 2023-12-18 DIAGNOSIS — L812 Freckles: Secondary | ICD-10-CM | POA: Diagnosis not present

## 2023-12-18 DIAGNOSIS — L570 Actinic keratosis: Secondary | ICD-10-CM | POA: Diagnosis not present

## 2023-12-18 DIAGNOSIS — D692 Other nonthrombocytopenic purpura: Secondary | ICD-10-CM | POA: Diagnosis not present

## 2023-12-18 DIAGNOSIS — Z85828 Personal history of other malignant neoplasm of skin: Secondary | ICD-10-CM | POA: Diagnosis not present

## 2023-12-19 ENCOUNTER — Ambulatory Visit (INDEPENDENT_AMBULATORY_CARE_PROVIDER_SITE_OTHER): Admitting: Family Medicine

## 2023-12-19 ENCOUNTER — Encounter: Payer: Self-pay | Admitting: Family Medicine

## 2023-12-19 VITALS — BP 110/68 | HR 54 | Temp 97.7°F | Wt 200.6 lb

## 2023-12-19 DIAGNOSIS — Z7984 Long term (current) use of oral hypoglycemic drugs: Secondary | ICD-10-CM

## 2023-12-19 DIAGNOSIS — E119 Type 2 diabetes mellitus without complications: Secondary | ICD-10-CM

## 2023-12-19 DIAGNOSIS — M79672 Pain in left foot: Secondary | ICD-10-CM

## 2023-12-19 LAB — POCT GLYCOSYLATED HEMOGLOBIN (HGB A1C): Hemoglobin A1C: 6.3 % — AB (ref 4.0–5.6)

## 2023-12-19 NOTE — Progress Notes (Signed)
   Subjective:    Patient ID: [name] [name]., male    DOB: June 09, 1946, 78 y.o.   MRN: [id number]  HPI Here for one year of pain in the left foot that comes and goes. No hx of trauma. The pain is sharp, and it is centered at the base of the 3rd toe. No warmth or swelling or redness. He has no pain when he is barefoot, but he has pain anytime he wears shoes. He denies any numbness or tingling.    Review of Systems  Constitutional: Negative.   Respiratory: Negative.    Cardiovascular: Negative.   Musculoskeletal:  Positive for arthralgias.       Objective:   Physical Exam Constitutional:      Appearance: Normal appearance.  Cardiovascular:     Rate and Rhythm: Normal rate and regular rhythm.     Pulses: Normal pulses.     Heart sounds: Normal heart sounds.  Pulmonary:     Effort: Pulmonary effort is normal.     Breath sounds: Normal breath sounds.  Musculoskeletal:     Comments: Left foot appears normal. He is tender between the 3rd and 4th metatarsal heads   Neurological:     Mental Status: He is alert.           Assessment & Plan:  Left foot pain, likely due to a Morton's neuroma. Refer to Podiatry.  Gershon Crane, MD

## 2023-12-20 DIAGNOSIS — M25511 Pain in right shoulder: Secondary | ICD-10-CM | POA: Diagnosis not present

## 2023-12-20 DIAGNOSIS — M25811 Other specified joint disorders, right shoulder: Secondary | ICD-10-CM | POA: Diagnosis not present

## 2023-12-25 DIAGNOSIS — M25811 Other specified joint disorders, right shoulder: Secondary | ICD-10-CM | POA: Diagnosis not present

## 2023-12-25 DIAGNOSIS — M25511 Pain in right shoulder: Secondary | ICD-10-CM | POA: Diagnosis not present

## 2023-12-27 ENCOUNTER — Ambulatory Visit (INDEPENDENT_AMBULATORY_CARE_PROVIDER_SITE_OTHER)

## 2023-12-27 ENCOUNTER — Ambulatory Visit: Admitting: Podiatry

## 2023-12-27 ENCOUNTER — Encounter: Payer: Self-pay | Admitting: Podiatry

## 2023-12-27 DIAGNOSIS — G5762 Lesion of plantar nerve, left lower limb: Secondary | ICD-10-CM

## 2023-12-27 DIAGNOSIS — H903 Sensorineural hearing loss, bilateral: Secondary | ICD-10-CM | POA: Insufficient documentation

## 2023-12-27 DIAGNOSIS — M7752 Other enthesopathy of left foot: Secondary | ICD-10-CM

## 2023-12-27 MED ORDER — TRIAMCINOLONE ACETONIDE 10 MG/ML IJ SUSP
10.0000 mg | Freq: Once | INTRAMUSCULAR | Status: AC
  Administered 2023-12-27: 10 mg via INTRA_ARTICULAR

## 2023-12-27 NOTE — Progress Notes (Signed)
 Subjective:   Patient ID: [name] Kays., male   DOB: 78 y.o.   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  Patient has not really done anything and states it does not hurt when barefoot only when wearing tighter shoes.  Patient does not smoke likes to be active   Review of Systems  All other systems reviewed and are negative.       Objective:  Physical Exam Vitals and nursing note reviewed.  Constitutional:      Appearance: He is well-developed.  Pulmonary:     Effort: Pulmonary effort is normal.  Musculoskeletal:        General: Normal range of motion.  Skin:    General: Skin is warm.  Neurological:     Mental Status: He is alert.     Neurovascular status intact muscle strength adequate range of motion within normal limits with exquisite discomfort third interspace left foot radiating discomfort between the adjacent digits with pain with palpation.  Good digital perfusion well-oriented x 3     Assessment:  Neuroma symptomatology third interspace left foot with radiating discomfort     Plan:  H&P reviewed discussed neuroma symptomatology and reviewed this with him and at this point did sterile prep and injected the third interspace with a combination of Xylocaine Marcaine and Kenalog 1.5 cc.  I did discuss surgical excision which I think will be need to be done in the future but we will get a try to get him through the next period of time and he will be seen back in 6 weeks  X-rays were negative for signs of fracture or bony pathology associated with condition appears to be soft tissue

## 2023-12-30 ENCOUNTER — Other Ambulatory Visit: Payer: Self-pay | Admitting: Family Medicine

## 2024-01-03 DIAGNOSIS — M50321 Other cervical disc degeneration at C4-C5 level: Secondary | ICD-10-CM | POA: Diagnosis not present

## 2024-01-03 DIAGNOSIS — M25811 Other specified joint disorders, right shoulder: Secondary | ICD-10-CM | POA: Diagnosis not present

## 2024-01-03 DIAGNOSIS — M25511 Pain in right shoulder: Secondary | ICD-10-CM | POA: Diagnosis not present

## 2024-01-03 DIAGNOSIS — M9903 Segmental and somatic dysfunction of lumbar region: Secondary | ICD-10-CM | POA: Diagnosis not present

## 2024-01-03 DIAGNOSIS — M50322 Other cervical disc degeneration at C5-C6 level: Secondary | ICD-10-CM | POA: Diagnosis not present

## 2024-01-03 DIAGNOSIS — M51371 Other intervertebral disc degeneration, lumbosacral region with lower extremity pain only: Secondary | ICD-10-CM | POA: Diagnosis not present

## 2024-01-10 ENCOUNTER — Ambulatory Visit (INDEPENDENT_AMBULATORY_CARE_PROVIDER_SITE_OTHER): Admitting: Family Medicine

## 2024-01-10 ENCOUNTER — Encounter: Payer: Self-pay | Admitting: Family Medicine

## 2024-01-10 VITALS — BP 124/68 | HR 67 | Temp 98.2°F | Wt 199.0 lb

## 2024-01-10 DIAGNOSIS — L03116 Cellulitis of left lower limb: Secondary | ICD-10-CM

## 2024-01-10 DIAGNOSIS — G4730 Sleep apnea, unspecified: Secondary | ICD-10-CM

## 2024-01-10 MED ORDER — CEPHALEXIN 500 MG PO CAPS: 500.0000 mg | ORAL_CAPSULE | Freq: Three times a day (TID) | ORAL | 0 refills | Status: AC

## 2024-01-10 NOTE — Progress Notes (Signed)
   Subjective:    Patient ID: [name] [name]., male    DOB: [birth year], 78 y.o.   MRN: [id number]  HPI Here with his wife for several issues. First he remembers feeling a slight sharp pain in the bottom of his left foot as he was walking barefoot in his yard 2 weeks ago. Since then the area has continued to be tender and he is worried that a piece of pine straw may be in his skin. The other issue is he has been snoring loudly at night for the past year or so, and his wife says he is very restless at night and sometimes gasps for air.    Review of Systems  Constitutional: Negative.   Respiratory:  Positive for apnea.   Cardiovascular: Negative.   Skin:  Positive for wound.       Objective:   Physical Exam Constitutional:      Appearance: Normal appearance.  Cardiovascular:     Rate and Rhythm: Normal rate and regular rhythm.     Pulses: Normal pulses.     Heart sounds: Normal heart sounds.  Pulmonary:     Effort: Pulmonary effort is normal.     Breath sounds: Normal breath sounds.  Skin:    Comments: There are several tiny superficial puncture wounds on the ball of the left foot. No erythema or warmth, but the area is tender. No foreign bodies are found   Neurological:     Mental Status: He is alert.           Assessment & Plan:  He very likely has sleep apnea, so we will refer him to Pulmonology to evaluate further. He also has a slight cellulitis in the left foot, and we will treat this with 10 days of Keflex .  Corita Diego, MD

## 2024-01-15 DIAGNOSIS — M25511 Pain in right shoulder: Secondary | ICD-10-CM | POA: Diagnosis not present

## 2024-01-15 DIAGNOSIS — M25811 Other specified joint disorders, right shoulder: Secondary | ICD-10-CM | POA: Diagnosis not present

## 2024-01-16 DIAGNOSIS — H43813 Vitreous degeneration, bilateral: Secondary | ICD-10-CM | POA: Diagnosis not present

## 2024-01-16 DIAGNOSIS — H401112 Primary open-angle glaucoma, right eye, moderate stage: Secondary | ICD-10-CM | POA: Diagnosis not present

## 2024-01-16 DIAGNOSIS — H1711 Central corneal opacity, right eye: Secondary | ICD-10-CM | POA: Diagnosis not present

## 2024-01-16 DIAGNOSIS — M4316 Spondylolisthesis, lumbar region: Secondary | ICD-10-CM | POA: Diagnosis not present

## 2024-01-16 DIAGNOSIS — H401123 Primary open-angle glaucoma, left eye, severe stage: Secondary | ICD-10-CM | POA: Diagnosis not present

## 2024-01-22 DIAGNOSIS — Z96611 Presence of right artificial shoulder joint: Secondary | ICD-10-CM | POA: Diagnosis not present

## 2024-01-22 DIAGNOSIS — M25811 Other specified joint disorders, right shoulder: Secondary | ICD-10-CM | POA: Diagnosis not present

## 2024-01-22 DIAGNOSIS — M25511 Pain in right shoulder: Secondary | ICD-10-CM | POA: Diagnosis not present

## 2024-01-29 DIAGNOSIS — M25511 Pain in right shoulder: Secondary | ICD-10-CM | POA: Diagnosis not present

## 2024-01-29 DIAGNOSIS — M25811 Other specified joint disorders, right shoulder: Secondary | ICD-10-CM | POA: Diagnosis not present

## 2024-01-31 DIAGNOSIS — M50322 Other cervical disc degeneration at C5-C6 level: Secondary | ICD-10-CM | POA: Diagnosis not present

## 2024-01-31 DIAGNOSIS — M50321 Other cervical disc degeneration at C4-C5 level: Secondary | ICD-10-CM | POA: Diagnosis not present

## 2024-01-31 DIAGNOSIS — M9903 Segmental and somatic dysfunction of lumbar region: Secondary | ICD-10-CM | POA: Diagnosis not present

## 2024-01-31 DIAGNOSIS — M51371 Other intervertebral disc degeneration, lumbosacral region with lower extremity pain only: Secondary | ICD-10-CM | POA: Diagnosis not present

## 2024-02-07 ENCOUNTER — Ambulatory Visit (INDEPENDENT_AMBULATORY_CARE_PROVIDER_SITE_OTHER): Admitting: Podiatry

## 2024-02-07 ENCOUNTER — Encounter: Payer: Self-pay | Admitting: Podiatry

## 2024-02-07 ENCOUNTER — Telehealth: Payer: Self-pay | Admitting: Podiatry

## 2024-02-07 VITALS — Ht 73.0 in | Wt 199.0 lb

## 2024-02-07 DIAGNOSIS — M25511 Pain in right shoulder: Secondary | ICD-10-CM | POA: Diagnosis not present

## 2024-02-07 DIAGNOSIS — M25811 Other specified joint disorders, right shoulder: Secondary | ICD-10-CM | POA: Diagnosis not present

## 2024-02-07 DIAGNOSIS — G5762 Lesion of plantar nerve, left lower limb: Secondary | ICD-10-CM | POA: Diagnosis not present

## 2024-02-07 NOTE — Progress Notes (Signed)
 Subjective:   Patient ID: [name] Kays., male   DOB: 78 y.o.   MRN: [id number]   HPI Patient states he did better with the medication for about a week or 2 and then it has gradually come back and is back to where it was previously   ROS      Objective:  Physical Exam  Neuro vascular status intact with exquisite discomfort third interspace left radiating pain into the adjacent digits positive Amon Kand sign noted     Assessment:  Strong probability for neuroma symptomatology left     Plan:  H&P reviewed discussed at great length went over options he wants it fixed.  I have recommended neurectomy third interspace left I explained procedure risk patient wants surgery and at this point I allowed him to read the consent form for surgery understanding all risk alternative treatments as listed and the fact there is no guarantee this will solve the problem.  Patient wants surgery signed consent form and is scheduled for outpatient procedure

## 2024-02-07 NOTE — Telephone Encounter (Signed)
 Received surgical consent forms.  Left message for pt to call me back and we can get his surgery scheduled.

## 2024-02-08 ENCOUNTER — Ambulatory Visit (HOSPITAL_BASED_OUTPATIENT_CLINIC_OR_DEPARTMENT_OTHER): Admitting: Nurse Practitioner

## 2024-02-08 ENCOUNTER — Telehealth: Payer: Self-pay | Admitting: Podiatry

## 2024-02-08 ENCOUNTER — Encounter (HOSPITAL_BASED_OUTPATIENT_CLINIC_OR_DEPARTMENT_OTHER): Payer: Self-pay | Admitting: Nurse Practitioner

## 2024-02-08 VITALS — BP 136/60 | HR 49 | Ht 73.0 in | Wt 201.8 lb

## 2024-02-08 DIAGNOSIS — G4709 Other insomnia: Secondary | ICD-10-CM

## 2024-02-08 DIAGNOSIS — G4700 Insomnia, unspecified: Secondary | ICD-10-CM | POA: Insufficient documentation

## 2024-02-08 DIAGNOSIS — R0683 Snoring: Secondary | ICD-10-CM | POA: Diagnosis not present

## 2024-02-08 DIAGNOSIS — G4719 Other hypersomnia: Secondary | ICD-10-CM

## 2024-02-08 DIAGNOSIS — Z87891 Personal history of nicotine dependence: Secondary | ICD-10-CM

## 2024-02-08 NOTE — Progress Notes (Signed)
 @Patient  ID: [name] Kays., male    DOB: [birth year], 78 y.o.   MRN: [id number]  Chief Complaint  Patient presents with   Establish Care    Consult sleep apnea    Referring provider: Donley Furth, MD  HPI: 78 year old male, former smoker referred for sleep consult.  Past medical history significant for hypertension, PVC, DM2, BPH, CKD, gout uveitis on immunosuppression therapy.   TEST/EVENTS:   02/08/2024: Today - sleep consult Discussed the use of AI scribe software for clinical note transcription with the patient, who gave verbal consent to proceed.  History of Present Illness   [name] [name]. "[name]" is a 78 year old male who presents for a sleep consult due to snoring and suspected sleep apnea.  He experiences snoring that affects his wife's sleep and has daytime tiredness. He typically gets six to seven hours of sleep, which is an improvement from previously getting only three hours. Often goes to bed early and wakes up early. He woke up at 3 AM today and could not return to sleep but he went to bed at 830 pm last night. His wife has observed him stopping breathing at times during sleep, although this has not occurred recently.   He experiences leg cramps at night, which have been occurring for the past three to four years. These cramps often prompt him to get up and use the restroom, which he does about twice a night. He has had his iron levels, vitamin B levels, blood counts, and electrolytes checked relatively recently. Hbg and RBC were slightly low but iron levels normal. Doesn't necessarily feel like his legs are restless though. He's drinking pickle juice, which seems to be helping.   No history of sleepwalking, drowsy driving, or morning headaches. 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 He does not operate heavy machinery. Weight is up 15 lb over the last two years. No supplemental oxygen use. Lives with his wife.   He has a few alcoholic  drinks a week. No excessive caffeine intake.   He takes Ambien , usually 5 mg, and gabapentin  at night for neuropathy.     Epworth 8  Allergies  Allergen Reactions   Contrast Media [Iodinated Contrast Media] Hives and Itching    Hives and itching after IV contrast injection   Red Dye #40 (Allura Red)     hives   Fluorescein Other (See Comments)   Ioxaglate Itching and Hives    Hives and itching after IV contrast injection    Immunization History  Administered Date(s) Administered   Fluad Quad(high Dose 65+) 05/27/2019, 05/26/2021, 05/30/2022   Fluad Trivalent(High Dose 65+) 06/05/2023   Influenza Split 09/28/2011, 05/28/2012, 05/30/2013   Influenza Whole 07/19/2007, 06/05/2008, 07/07/2009   Influenza, High Dose Seasonal PF 08/01/2016, 06/12/2018   Influenza, Seasonal, Injecte, Preservative Fre 05/27/2019   Influenza-Unspecified 06/27/2014, 06/09/2017   Pfizer Covid-19 Vaccine Bivalent Booster 6yrs & up 11/24/2021, 06/08/2022   Pneumococcal Conjugate-13 11/04/2014   Pneumococcal Polysaccharide-23 04/14/2008   Td 09/04/2006   Tdap 06/12/2018   Zoster Recombinant(Shingrix) 10/18/2019, 01/07/2021   Zoster, Live 01/06/2011    Past Medical History:  Diagnosis Date   Arthritis    Cataract    Bil/ right eye surgery   Diabetes mellitus    type II   Diverticulitis of colon    ED (erectile dysfunction)    Gout    no meds   H/O echocardiogram 2013   normal    History  of irregular heartbeat    Per pt, heart skips beats at times/had Echo   Hyperlipidemia    Hypertension    Iritis, recurrent     sees Dr. Dyanne Glass at [facility]    Normal cardiac stress test 09/27/2002   Palpitations    PVCs and PACs per event monitor   Uveitis    sees Dr Mahala Schultz at [facility] Dr Mason Sole now    Tobacco History: Social History   Tobacco Use  Smoking Status Former   Current packs/day: 0.00   Average packs/day: 1 pack/day for 8.0 years (8.0 ttl pk-yrs)   Types: Cigarettes   Start date:  02/06/1962   Quit date: 02/06/1970   Years since quitting: 54.0  Smokeless Tobacco Never   Counseling given: Not Answered   Outpatient Medications Prior to Visit  Medication Sig Dispense Refill   allopurinol  (ZYLOPRIM ) 100 MG tablet TAKE 1 TABLET(100 MG) BY MOUTH DAILY 90 tablet 0   aspirin  EC 81 MG tablet Take 1 tablet (81 mg total) by mouth daily. Swallow whole. 30 tablet 12   brimonidine (ALPHAGAN) 0.2 % ophthalmic solution Place 1 drop into both eyes as needed. Used 1 or 2 a year     calcium -vitamin D (OSCAL WITH D) 500-200 MG-UNIT per tablet Take 2 tablets by mouth daily.     colchicine  0.6 MG tablet Take 1 tablet (0.6 mg total) by mouth every 6 (six) hours as needed (gout). 60 tablet 5   CONTOUR NEXT TEST test strip USE TO TEST BLOOD SUGAR 3 TO 4 TIMES DAILY 300 strip 0   finasteride  (PROSCAR ) 5 MG tablet Take 1 tablet (5 mg total) by mouth daily. 90 tablet 3   FOLIC ACID  PO Take 10 mg by mouth daily.      gabapentin  (NEURONTIN ) 300 MG capsule TAKE 1 CAPSULE(300 MG) BY MOUTH TWICE DAILY (Patient taking differently: Taking once daily) 180 capsule 3   glipiZIDE  (GLUCOTROL ) 10 MG tablet TAKE 1 TABLET(10 MG) BY MOUTH TWICE DAILY BEFORE A MEAL 180 tablet 3   HUMIRA  PEN 40 MG/0.4ML PNKT SMARTSIG:40 Milligram(s) SUB-Q Every 2 Weeks     lisinopril  (ZESTRIL ) 10 MG tablet TAKE 1 TABLET(10 MG) BY MOUTH DAILY 90 tablet 3   meloxicam  (MOBIC ) 15 MG tablet Take 0.5 tablets (7.5 mg total) by mouth daily.     methotrexate  (RHEUMATREX) 2.5 MG tablet Take 4 tablets by mouth once a week.     Microlet Lancets MISC USE TO TEST DAILY 100 each 1   Multiple Vitamin (MULTIVITAMIN) tablet Take 1 tablet by mouth daily.     pioglitazone  (ACTOS ) 30 MG tablet TAKE 1 AND 1/2 TABLETS(45 MG) BY MOUTH DAILY 90 tablet 3   rosuvastatin  (CRESTOR ) 20 MG tablet TAKE 1 TABLET BY MOUTH DAILY 90 tablet 1   timolol (TIMOPTIC) 0.5 % ophthalmic solution 1 drop 2 (two) times daily.     traMADol  (ULTRAM ) 50 MG tablet Take 2  tablets (100 mg total) by mouth every 6 (six) hours as needed. 60 tablet 5   zolpidem  (AMBIEN ) 10 MG tablet Take 1 tablet (10 mg total) by mouth at bedtime. 90 tablet 1   fluorouracil (EFUDEX) 5 % cream Apply topically. (Patient not taking: Reported on 02/08/2024)     MELATONIN PO Take 20 mg by mouth at bedtime. OTC (Patient not taking: Reported on 02/08/2024)     No facility-administered medications prior to visit.     Review of Systems:   Constitutional: No night sweats, fevers, chills, or lassitude. +  fatigue, weight gain  HEENT: No headaches, difficulty swallowing, tooth/dental problems, or sore throat. No sneezing, itching, ear ache, nasal congestion, or post nasal drip CV:  No chest pain, orthopnea, PND, swelling in lower extremities, anasarca, dizziness, palpitations, syncope Resp: +snoring, witnessed apneas. No shortness of breath with exertion or at rest. No cough GI:  No heartburn, indigestion GU: No dysuria, change in color of urine, urgency or frequency.  No flank pain, no hematuria  Skin: No rash, lesions, ulcerations MSK:  +chronic joint pains, leg cramps Neuro: No memory impairment. +neuropathy  Psych: No depression or anxiety. Mood stable. +sleep disturbance    Physical Exam:  BP 136/60   Pulse (!) 49   Ht 6\' 1"  (1.854 m)   Wt 201 lb 12.8 oz (91.5 kg)   SpO2 99%   BMI 26.62 kg/m   GEN: Pleasant, interactive, well-appearing; in no acute distress HEENT:  Normocephalic and atraumatic. PERRLA. Sclera white. Nasal turbinates pink, moist and patent bilaterally. No rhinorrhea present. Oropharynx pink and moist, without exudate or edema. No lesions, ulcerations, or postnasal drip. Mallampati III/IV NECK:  Supple w/ fair ROM. Thyroid  symmetrical with no goiter or nodules palpated. No lymphadenopathy.   CV: RRR, no m/r/g, no peripheral edema. Pulses intact, +2 bilaterally. No cyanosis, pallor or clubbing. PULMONARY:  Unlabored, regular breathing. Clear bilaterally A&P w/o  wheezes/rales/rhonchi. No accessory muscle use.  GI: BS present and normoactive. Soft, non-tender to palpation.  MSK: No erythema, warmth or tenderness. Cap refil <2 sec all extrem. No deformities or joint swelling noted.  Neuro: A/Ox3. No focal deficits noted.   Skin: Warm, no lesions or rashe Psych: Normal affect and behavior. Judgement and thought content appropriate.     Lab Results:  CBC    Component Value Date/Time   WBC 5.9 11/23/2023 1316   RBC 3.66 (L) 11/23/2023 1316   HGB 12.6 (L) 11/23/2023 1316   HCT 36.9 (L) 11/23/2023 1316   PLT 132.0 (L) 11/23/2023 1316   MCV 100.9 (H) 11/23/2023 1316   MCH 33.3 11/08/2023 2023   MCHC 34.1 11/23/2023 1316   RDW 16.1 (H) 11/23/2023 1316   LYMPHSABS 1.3 11/23/2023 1316   MONOABS 0.5 11/23/2023 1316   EOSABS 0.2 11/23/2023 1316   BASOSABS 0.0 11/23/2023 1316    BMET    Component Value Date/Time   NA 137 11/08/2023 2023   K 4.5 11/08/2023 2023   CL 104 11/08/2023 2023   CO2 21 (L) 11/08/2023 2023   GLUCOSE 162 (H) 11/08/2023 2023   GLUCOSE 140 (H) 09/20/2006 0929   BUN 40 (H) 11/08/2023 2023   CREATININE 1.41 (H) 11/08/2023 2023   CREATININE 1.18 05/21/2020 0833   CALCIUM  9.7 11/08/2023 2023   GFRNONAA 51 (L) 11/08/2023 2023   GFRAA 83 (L) 04/02/2012 1115    BNP No results found for: "BNP"   Imaging:  No results found.  triamcinolone  acetonide (KENALOG ) 10 MG/ML injection 10 mg     Date Action Dose Route User   12/27/2023 1155 Given 10 mg Intra-articular Regal, Norman S, DPM           No data to display          No results found for: "NITRICOXIDE"      Assessment & Plan:   Excessive daytime sleepiness He has snoring, excessive daytime sleepiness, nocturnal apneic events, restless sleep. BMI 26. History of HTN, CKD. Muscle cramps at night; seem to be improved with pickle juice. Given this,  I am concerned he could  have sleep disordered breathing with obstructive sleep apnea. He will need sleep  study for further evaluation.    - discussed how weight can impact sleep and risk for sleep disordered breathing - discussed options to assist with weight loss: combination of diet modification, cardiovascular and strength training exercises   - had an extensive discussion regarding the adverse health consequences related to untreated sleep disordered breathing - specifically discussed the risks for hypertension, coronary artery disease, cardiac dysrhythmias, cerebrovascular disease, and diabetes - lifestyle modification discussed   - discussed how sleep disruption can increase risk of accidents, particularly when driving - safe driving practices were discussed   Patient Instructions  Given your symptoms, I am concerned that you may have sleep disordered breathing with sleep apnea. You will need a sleep study for further evaluation. Someone will contact you to schedule this.   We discussed how untreated sleep apnea puts an individual at risk for cardiac arrhthymias, pulm HTN, DM, stroke and increases their risk for daytime accidents. We also briefly reviewed treatment options including weight loss, side sleeping position, oral appliance, CPAP therapy or referral to ENT for possible surgical options  Use caution when driving and pull over if you become sleepy.  Follow up in 6-8 weeks with Katie Zaelyn Barbary,NP to go over sleep study results, or sooner, if needed.       Loud snoring See above  Insomnia Stable on current regimen   Advised if symptoms do not improve or worsen, to please contact office for sooner follow up or seek emergency care.   I spent 35 minutes of dedicated to the care of this patient on the date of this encounter to include pre-visit review of records, face-to-face time with the patient discussing conditions above, post visit ordering of testing, clinical documentation with the electronic health record, making appropriate referrals as documented, and communicating  necessary findings to members of the patients care team.  Roetta Clarke, NP 02/08/2024  Pt aware and understands NP's role.

## 2024-02-08 NOTE — Progress Notes (Signed)
 Epworth Sleepiness Scale  Use the following scale to choose the most appropriate number for each situation. 0 Would never nod off 1  Slight  chance of nodding off 2 Moderate chance of nodding off 3 High chance of nodding off  Sitting and reading: 2 Watching TV: 1 Sitting, inactive, in a public place (e.g., in a meeting, theater, or dinner event): 1 As a passenger in a car for an hour or more without stopping for a break: 2 Lying down to rest when circumstances permit:1 Sitting and talking to someone: 0 Sitting quietly after a meal without alcohol: 1 In a car, while stopped for a few  minutes in traffic or at a light: 0  TOTOAL: 8

## 2024-02-08 NOTE — Assessment & Plan Note (Signed)
 Stable on current regimen

## 2024-02-08 NOTE — Progress Notes (Deleted)
 @Patient  ID: [name] [name]., male    DOB: 07/16/1946, 78 y.o.   MRN: [id number]  Chief Complaint  Patient presents with   Establish Care    Consult sleep apnea    Referring provider: Donley Furth, MD  HPI: 78 year old male, former smoker referred for sleep consult.  Past medical history significant for hypertension, PVC, DM2, BPH, CKD, gout uveitis on immunosuppression therapy.   TEST/EVENTS:   02/08/2024: Today - sleep consult Discussed the use of AI scribe software for clinical note transcription with the patient, who gave verbal consent to proceed.  History of Present Illness   [name] [name]. "[name]" is a 78 year old male who presents for a sleep consult due to snoring and suspected sleep apnea.  He experiences snoring that affects his wife's sleep and has daytime tiredness. He typically gets six to seven hours of sleep, which is an improvement from previously getting only three hours. Often goes to bed early and wakes up early. He woke up at 3 AM today and could not return to sleep but he went to bed at 830 pm last night. His wife has observed him stopping breathing at times during sleep, although this has not occurred recently.   He experiences leg cramps at night, which have been occurring for the past three to four years. These cramps often prompt him to get up and use the restroom, which he does about twice a night. He has had his iron levels, vitamin B levels, blood counts, and electrolytes checked relatively recently. Hbg and RBC were slightly low but iron levels normal. Doesn't necessarily feel like his legs are restless though. He's drinking pickle juice, which seems to be helping.   No history of sleepwalking, drowsy driving, or morning headaches. 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 He does not operate heavy machinery. No significant weight change.  He has a few alcoholic drinks a week. No excessive caffeine intake.   He takes  Ambien , usually 5 mg, and gabapentin  at night for neuropathy.       Allergies  Allergen Reactions   Contrast Media [Iodinated Contrast Media] Hives and Itching    Hives and itching after IV contrast injection   Red Dye #40 (Allura Red)     hives   Fluorescein Other (See Comments)   Ioxaglate Itching and Hives    Hives and itching after IV contrast injection    Immunization History  Administered Date(s) Administered   Fluad Quad(high Dose 65+) 05/27/2019, 05/26/2021, 05/30/2022   Fluad Trivalent(High Dose 65+) 06/05/2023   Influenza Split 09/28/2011, 05/28/2012, 05/30/2013   Influenza Whole 07/19/2007, 06/05/2008, 07/07/2009   Influenza, High Dose Seasonal PF 08/01/2016, 06/12/2018   Influenza, Seasonal, Injecte, Preservative Fre 05/27/2019   Influenza-Unspecified 06/27/2014, 06/09/2017   Pfizer Covid-19 Vaccine Bivalent Booster 66yrs & up 11/24/2021, 06/08/2022   Pneumococcal Conjugate-13 11/04/2014   Pneumococcal Polysaccharide-23 04/14/2008   Td 09/04/2006   Tdap 06/12/2018   Zoster Recombinant(Shingrix) 10/18/2019, 01/07/2021   Zoster, Live 01/06/2011    Past Medical History:  Diagnosis Date   Arthritis    Cataract    Bil/ right eye surgery   Diabetes mellitus    type II   Diverticulitis of colon    ED (erectile dysfunction)    Gout    no meds   H/O echocardiogram 2013   normal    History of irregular heartbeat    Per pt, heart skips beats at times/had Echo  Hyperlipidemia    Hypertension    Iritis, recurrent     sees Dr. Dyanne Glass at [facility]    Normal cardiac stress test 09/27/2002   Palpitations    PVCs and PACs per event monitor   Uveitis    sees Dr Mahala Schultz at [facility] [address] [facility] now    Tobacco History: Social History   Tobacco Use  Smoking Status Former   Current packs/day: 0.00   Average packs/day: 1 pack/day for 8.0 years (8.0 ttl pk-yrs)   Types: Cigarettes   Start date: 02/06/1962   Quit date: 02/06/1970   Years since quitting: 54.0   Smokeless Tobacco Never   Counseling given: Not Answered   Outpatient Medications Prior to Visit  Medication Sig Dispense Refill   allopurinol  (ZYLOPRIM ) 100 MG tablet TAKE 1 TABLET(100 MG) BY MOUTH DAILY 90 tablet 0   aspirin  EC 81 MG tablet Take 1 tablet (81 mg total) by mouth daily. Swallow whole. 30 tablet 12   brimonidine (ALPHAGAN) 0.2 % ophthalmic solution Place 1 drop into both eyes as needed. Used 1 or 2 a year     calcium -vitamin D (OSCAL WITH D) 500-200 MG-UNIT per tablet Take 2 tablets by mouth daily.     colchicine  0.6 MG tablet Take 1 tablet (0.6 mg total) by mouth every 6 (six) hours as needed (gout). 60 tablet 5   CONTOUR NEXT TEST test strip USE TO TEST BLOOD SUGAR 3 TO 4 TIMES DAILY 300 strip 0   finasteride  (PROSCAR ) 5 MG tablet Take 1 tablet (5 mg total) by mouth daily. 90 tablet 3   FOLIC ACID  PO Take 10 mg by mouth daily.      gabapentin  (NEURONTIN ) 300 MG capsule TAKE 1 CAPSULE(300 MG) BY MOUTH TWICE DAILY (Patient taking differently: Taking once daily) 180 capsule 3   glipiZIDE  (GLUCOTROL ) 10 MG tablet TAKE 1 TABLET(10 MG) BY MOUTH TWICE DAILY BEFORE A MEAL 180 tablet 3   HUMIRA  PEN 40 MG/0.4ML PNKT SMARTSIG:40 Milligram(s) SUB-Q Every 2 Weeks     lisinopril  (ZESTRIL ) 10 MG tablet TAKE 1 TABLET(10 MG) BY MOUTH DAILY 90 tablet 3   meloxicam  (MOBIC ) 15 MG tablet Take 0.5 tablets (7.5 mg total) by mouth daily.     methotrexate  (RHEUMATREX) 2.5 MG tablet Take 4 tablets by mouth once a week.     Microlet Lancets MISC USE TO TEST DAILY 100 each 1   Multiple Vitamin (MULTIVITAMIN) tablet Take 1 tablet by mouth daily.     pioglitazone  (ACTOS ) 30 MG tablet TAKE 1 AND 1/2 TABLETS(45 MG) BY MOUTH DAILY 90 tablet 3   rosuvastatin  (CRESTOR ) 20 MG tablet TAKE 1 TABLET BY MOUTH DAILY 90 tablet 1   timolol (TIMOPTIC) 0.5 % ophthalmic solution 1 drop 2 (two) times daily.     traMADol  (ULTRAM ) 50 MG tablet Take 2 tablets (100 mg total) by mouth every 6 (six) hours as needed. 60  tablet 5   zolpidem  (AMBIEN ) 10 MG tablet Take 1 tablet (10 mg total) by mouth at bedtime. 90 tablet 1   fluorouracil (EFUDEX) 5 % cream Apply topically. (Patient not taking: Reported on 02/08/2024)     MELATONIN PO Take 20 mg by mouth at bedtime. OTC (Patient not taking: Reported on 02/08/2024)     No facility-administered medications prior to visit.     Review of Systems:   Constitutional: No weight loss or gain, night sweats, fevers, chills, fatigue, or lassitude. HEENT: No headaches, difficulty swallowing, tooth/dental problems, or sore throat. No  sneezing, itching, ear ache, nasal congestion, or post nasal drip CV:  No chest pain, orthopnea, PND, swelling in lower extremities, anasarca, dizziness, palpitations, syncope Resp: No shortness of breath with exertion or at rest. No excess mucus or change in color of mucus. No productive or non-productive. No hemoptysis. No wheezing.  No chest wall deformity GI:  No heartburn, indigestion, abdominal pain, nausea, vomiting, diarrhea, change in bowel habits, loss of appetite, bloody stools.  GU: No dysuria, change in color of urine, urgency or frequency.  No flank pain, no hematuria  Skin: No rash, lesions, ulcerations MSK:  No joint pain or swelling.  No decreased range of motion.  No back pain. Neuro: No dizziness or lightheadedness.  Psych: No depression or anxiety. Mood stable.     Physical Exam:  BP 136/60   Pulse (!) 49   Ht 6\' 1"  (1.854 m)   Wt 201 lb 12.8 oz (91.5 kg)   SpO2 99%   BMI 26.62 kg/m   GEN: Pleasant, interactive, well-nourished/chronically-ill appearing/acutely-ill appearing/poorly-nourished/morbidly obese; in no acute distress.****** HEENT:  Normocephalic and atraumatic. EACs patent bilaterally. TM pearly gray with present light reflex bilaterally. PERRLA. Sclera white. Nasal turbinates pink, moist and patent bilaterally. No rhinorrhea present. Oropharynx pink and moist, without exudate or edema. No lesions,  ulcerations, or postnasal drip.  NECK:  Supple w/ fair ROM. No JVD present. Normal carotid impulses w/o bruits. Thyroid  symmetrical with no goiter or nodules palpated. No lymphadenopathy.   CV: RRR, no m/r/g, no peripheral edema. Pulses intact, +2 bilaterally. No cyanosis, pallor or clubbing. PULMONARY:  Unlabored, regular breathing. Clear bilaterally A&P w/o wheezes/rales/rhonchi. No accessory muscle use.  GI: BS present and normoactive. Soft, non-tender to palpation. No organomegaly or masses detected. No CVA tenderness. MSK: No erythema, warmth or tenderness. Cap refil <2 sec all extrem. No deformities or joint swelling noted.  Neuro: A/Ox3. No focal deficits noted.   Skin: Warm, no lesions or rashe Psych: Normal affect and behavior. Judgement and thought content appropriate.     Lab Results:  CBC    Component Value Date/Time   WBC 5.9 11/23/2023 1316   RBC 3.66 (L) 11/23/2023 1316   HGB 12.6 (L) 11/23/2023 1316   HCT 36.9 (L) 11/23/2023 1316   PLT 132.0 (L) 11/23/2023 1316   MCV 100.9 (H) 11/23/2023 1316   MCH 33.3 11/08/2023 2023   MCHC 34.1 11/23/2023 1316   RDW 16.1 (H) 11/23/2023 1316   LYMPHSABS 1.3 11/23/2023 1316   MONOABS 0.5 11/23/2023 1316   EOSABS 0.2 11/23/2023 1316   BASOSABS 0.0 11/23/2023 1316    BMET    Component Value Date/Time   NA 137 11/08/2023 2023   K 4.5 11/08/2023 2023   CL 104 11/08/2023 2023   CO2 21 (L) 11/08/2023 2023   GLUCOSE 162 (H) 11/08/2023 2023   GLUCOSE 140 (H) 09/20/2006 0929   BUN 40 (H) 11/08/2023 2023   CREATININE 1.41 (H) 11/08/2023 2023   CREATININE 1.18 05/21/2020 0833   CALCIUM  9.7 11/08/2023 2023   GFRNONAA 51 (L) 11/08/2023 2023   GFRAA 83 (L) 04/02/2012 1115    BNP No results found for: "BNP"   Imaging:  No results found.  triamcinolone  acetonide (KENALOG ) 10 MG/ML injection 10 mg     Date Action Dose Route User   12/27/2023 1155 Given 10 mg Intra-articular Regal, Norman S, DPM           No data to  display  No results found for: "NITRICOXIDE"      Assessment & Plan:   No problem-specific Assessment & Plan notes found for this encounter.   Advised if symptoms do not improve or worsen, to please contact office for sooner follow up or seek emergency care.   I spent *** minutes of dedicated to the care of this patient on the date of this encounter to include pre-visit review of records, face-to-face time with the patient discussing conditions above, post visit ordering of testing, clinical documentation with the electronic health record, making appropriate referrals as documented, and communicating necessary findings to members of the patients care team.  Roetta Clarke, NP 02/08/2024  Pt aware and understands NP's role.

## 2024-02-08 NOTE — Patient Instructions (Addendum)
Given your symptoms, I am concerned that you may have sleep disordered breathing with sleep apnea. You will need a sleep study for further evaluation. Someone will contact you to schedule this.   We discussed how untreated sleep apnea puts an individual at risk for cardiac arrhthymias, pulm HTN, DM, stroke and increases their risk for daytime accidents. We also briefly reviewed treatment options including weight loss, side sleeping position, oral appliance, CPAP therapy or referral to ENT for possible surgical options  Use caution when driving and pull over if you become sleepy.  Follow up in 6-8 weeks with Katie Gerri Acre,NP to go over sleep study results, or sooner, if needed

## 2024-02-08 NOTE — Assessment & Plan Note (Signed)
 He has snoring, excessive daytime sleepiness, nocturnal apneic events, restless sleep. BMI 26. History of HTN, CKD. Muscle cramps at night; seem to be improved with pickle juice. Given this,  I am concerned he could have sleep disordered breathing with obstructive sleep apnea. He will need sleep study for further evaluation.    - discussed how weight can impact sleep and risk for sleep disordered breathing - discussed options to assist with weight loss: combination of diet modification, cardiovascular and strength training exercises   - had an extensive discussion regarding the adverse health consequences related to untreated sleep disordered breathing - specifically discussed the risks for hypertension, coronary artery disease, cardiac dysrhythmias, cerebrovascular disease, and diabetes - lifestyle modification discussed   - discussed how sleep disruption can increase risk of accidents, particularly when driving - safe driving practices were discussed   Patient Instructions  Given your symptoms, I am concerned that you may have sleep disordered breathing with sleep apnea. You will need a sleep study for further evaluation. Someone will contact you to schedule this.   We discussed how untreated sleep apnea puts an individual at risk for cardiac arrhthymias, pulm HTN, DM, stroke and increases their risk for daytime accidents. We also briefly reviewed treatment options including weight loss, side sleeping position, oral appliance, CPAP therapy or referral to ENT for possible surgical options  Use caution when driving and pull over if you become sleepy.  Follow up in 6-8 weeks with Katie Dyllan Kats,NP to go over sleep study results, or sooner, if needed.

## 2024-02-08 NOTE — Telephone Encounter (Signed)
 Pt returned call and is just leaving pulmonary doctor and they are testing him for sleep apnea but not for 2 months. They mentioned something about having a possible issue with anesthesia.   He said it is up to you about surgery. Should he proceed with surgery.

## 2024-02-08 NOTE — Telephone Encounter (Signed)
 Should not be issue with this

## 2024-02-08 NOTE — Assessment & Plan Note (Signed)
 See above

## 2024-02-09 NOTE — Telephone Encounter (Signed)
 Left message for pt what Dr Celia Coles said. We have pt already scheduled for surgery on 03/05/24

## 2024-02-12 DIAGNOSIS — M25811 Other specified joint disorders, right shoulder: Secondary | ICD-10-CM | POA: Diagnosis not present

## 2024-02-12 DIAGNOSIS — M25511 Pain in right shoulder: Secondary | ICD-10-CM | POA: Diagnosis not present

## 2024-02-15 ENCOUNTER — Encounter

## 2024-02-15 DIAGNOSIS — G4719 Other hypersomnia: Secondary | ICD-10-CM

## 2024-02-15 DIAGNOSIS — R0683 Snoring: Secondary | ICD-10-CM

## 2024-02-19 ENCOUNTER — Telehealth: Payer: Self-pay | Admitting: Podiatry

## 2024-02-19 ENCOUNTER — Ambulatory Visit (HOSPITAL_BASED_OUTPATIENT_CLINIC_OR_DEPARTMENT_OTHER): Admitting: Nurse Practitioner

## 2024-02-19 DIAGNOSIS — M25811 Other specified joint disorders, right shoulder: Secondary | ICD-10-CM | POA: Diagnosis not present

## 2024-02-19 DIAGNOSIS — M25511 Pain in right shoulder: Secondary | ICD-10-CM | POA: Diagnosis not present

## 2024-02-19 NOTE — Telephone Encounter (Signed)
 Pt left message this morning at 952am stating he needed to move his surgery that is scheduled for 6/24 to 7/1.   Returned call and pts surgery has been changed to 7/1. Post ops changed Notified surgery center of the change

## 2024-02-20 ENCOUNTER — Other Ambulatory Visit: Payer: Self-pay | Admitting: Family Medicine

## 2024-02-26 ENCOUNTER — Encounter: Payer: Self-pay | Admitting: Family Medicine

## 2024-02-26 ENCOUNTER — Ambulatory Visit (INDEPENDENT_AMBULATORY_CARE_PROVIDER_SITE_OTHER): Admitting: Family Medicine

## 2024-02-26 VITALS — BP 124/72 | HR 68 | Temp 97.7°F | Wt 202.0 lb

## 2024-02-26 DIAGNOSIS — N410 Acute prostatitis: Secondary | ICD-10-CM

## 2024-02-26 DIAGNOSIS — M25511 Pain in right shoulder: Secondary | ICD-10-CM | POA: Diagnosis not present

## 2024-02-26 DIAGNOSIS — M25811 Other specified joint disorders, right shoulder: Secondary | ICD-10-CM | POA: Diagnosis not present

## 2024-02-26 LAB — POC URINALSYSI DIPSTICK (AUTOMATED)
Bilirubin, UA: NEGATIVE
Blood, UA: NEGATIVE
Glucose, UA: NEGATIVE
Ketones, UA: NEGATIVE
Leukocytes, UA: NEGATIVE
Nitrite, UA: NEGATIVE
Protein, UA: POSITIVE — AB
Spec Grav, UA: 1.015 (ref 1.010–1.025)
Urobilinogen, UA: 0.2 U/dL
pH, UA: 6 (ref 5.0–8.0)

## 2024-02-26 MED ORDER — CIPROFLOXACIN HCL 500 MG PO TABS: 500.0000 mg | ORAL_TABLET | Freq: Two times a day (BID) | ORAL | 0 refills | Status: DC

## 2024-02-26 NOTE — Addendum Note (Signed)
 Addended by: Philbert Brave on: 02/26/2024 04:23 PM   Modules accepted: Orders

## 2024-02-26 NOTE — Progress Notes (Signed)
   Subjective:    Patient ID: [name] [name]., male    DOB: 1946-08-15, 78 y.o.   MRN: [id number]  HPI Here for one week of urinary urgency and burning, as well as low back pain. No fever. He drinks plebty of water.    Review of Systems  Constitutional: Negative.   Cardiovascular: Negative.   Gastrointestinal: Negative.   Genitourinary:  Positive for dysuria, frequency and urgency. Negative for hematuria and testicular pain.       Objective:   Physical Exam Constitutional:      Appearance: Normal appearance.   Cardiovascular:     Rate and Rhythm: Normal rate and regular rhythm.     Pulses: Normal pulses.     Heart sounds: Normal heart sounds.  Pulmonary:     Effort: Pulmonary effort is normal.     Breath sounds: Normal breath sounds.  Abdominal:     Tenderness: There is no right CVA tenderness or left CVA tenderness.   Neurological:     Mental Status: He is alert.           Assessment & Plan:  Prostatitis, treat with 14 days of Cipro .  Corita Diego, MD

## 2024-03-01 DIAGNOSIS — G4733 Obstructive sleep apnea (adult) (pediatric): Secondary | ICD-10-CM | POA: Diagnosis not present

## 2024-03-03 ENCOUNTER — Other Ambulatory Visit: Payer: Self-pay | Admitting: Family Medicine

## 2024-03-04 ENCOUNTER — Encounter (HOSPITAL_BASED_OUTPATIENT_CLINIC_OR_DEPARTMENT_OTHER): Payer: Self-pay

## 2024-03-04 ENCOUNTER — Other Ambulatory Visit: Payer: Self-pay | Admitting: Family Medicine

## 2024-03-04 DIAGNOSIS — M25511 Pain in right shoulder: Secondary | ICD-10-CM | POA: Diagnosis not present

## 2024-03-04 DIAGNOSIS — M25811 Other specified joint disorders, right shoulder: Secondary | ICD-10-CM | POA: Diagnosis not present

## 2024-03-05 ENCOUNTER — Ambulatory Visit: Payer: Self-pay | Admitting: Nurse Practitioner

## 2024-03-08 ENCOUNTER — Telehealth: Payer: Self-pay | Admitting: Podiatry

## 2024-03-08 ENCOUNTER — Other Ambulatory Visit: Payer: Self-pay

## 2024-03-08 DIAGNOSIS — G4733 Obstructive sleep apnea (adult) (pediatric): Secondary | ICD-10-CM

## 2024-03-08 NOTE — Telephone Encounter (Signed)
 Patient was informed that someone will contact him for mask options. He verbalized understanding and no concern. Order was placed for new CPAP/mask of choice.

## 2024-03-08 NOTE — Telephone Encounter (Signed)
 PER [employer] AUTOMATED ASSISTANT NO AUTH REQUIRED FOR CPT 28080.SABRA DOS 03/12/24   CONFIRMATION # [id number]

## 2024-03-08 NOTE — Telephone Encounter (Signed)
 Calling regarding patient's result. LVM

## 2024-03-08 NOTE — Telephone Encounter (Signed)
-----   Message from Comer LULLA Rouleau sent at 03/05/2024 12:12 PM EDT ----- Severe sleep apnea. If patient is agreeable, recommend he start CPAP given severity. We discussed how untreated sleep apnea puts an individual at risk for cardiac arrhthymias, pulm HTN, DM, stroke  and increases their risk for daytime accidents. CPAP is most effective form of treatment.  If so, please place order for auto CPAP 5-15 cmH2O, mask of choice and heated humidity. Educate on proper use/care of device. Schedule f/u in 10-12 weeks to review use. Thanks.   ----- Message ----- From: Signa Carren LABOR, NT Sent: 03/04/2024   9:06 AM EDT To: Comer Rouleau LULLA, NP

## 2024-03-11 ENCOUNTER — Encounter: Payer: Self-pay | Admitting: Family Medicine

## 2024-03-11 ENCOUNTER — Encounter: Admitting: Podiatry

## 2024-03-11 ENCOUNTER — Telehealth (INDEPENDENT_AMBULATORY_CARE_PROVIDER_SITE_OTHER): Admitting: Family Medicine

## 2024-03-11 ENCOUNTER — Encounter (HOSPITAL_BASED_OUTPATIENT_CLINIC_OR_DEPARTMENT_OTHER): Payer: Self-pay

## 2024-03-11 DIAGNOSIS — M25511 Pain in right shoulder: Secondary | ICD-10-CM | POA: Diagnosis not present

## 2024-03-11 DIAGNOSIS — M25811 Other specified joint disorders, right shoulder: Secondary | ICD-10-CM | POA: Diagnosis not present

## 2024-03-11 DIAGNOSIS — G4733 Obstructive sleep apnea (adult) (pediatric): Secondary | ICD-10-CM | POA: Diagnosis not present

## 2024-03-11 MED ORDER — HYDROCODONE-ACETAMINOPHEN 10-325 MG PO TABS: 1.0000 | ORAL_TABLET | Freq: Three times a day (TID) | ORAL | 0 refills | Status: AC | PRN

## 2024-03-11 NOTE — Telephone Encounter (Signed)
**Note De-identified  [place] Obfuscation** Please advise 

## 2024-03-11 NOTE — Telephone Encounter (Signed)
 No, he's a new start CPAP so when he goes in to get his CPAP, the DME should show him some options and size him.

## 2024-03-11 NOTE — Telephone Encounter (Signed)
 Do you want him to call for mask fitting?

## 2024-03-11 NOTE — Progress Notes (Signed)
 Subjective:    Patient ID: [name] GORMAN [name], male    DOB: 05-25-1946, 78 y.o.   MRN: [id number]  HPI Virtual Visit via Video Note  I connected with the patient on 03/11/24 at  1:30 PM EDT by a video enabled telemedicine application and verified that I am speaking with the correct person using two identifiers.  Location patient: home Location provider:work or home office Persons participating in the virtual visit: patient, provider  I discussed the limitations of evaluation and management by telemedicine and the availability of in person appointments. The patient expressed understanding and agreed to proceed.   HPI: Here asking questions about sleep apnea. He recently did a sleep study that showed him to  have severe sleep apnea, and he was prescribed a CPAP machine. He has not picked this up yet, but he asks about which mask to try, whether the sleep apnea will ever go away, etc. He has found that he sleeps more soundly since her stopped drinking alcohol in the evenings.    ROS: See pertinent positives and negatives per HPI.  Past Medical History:  Diagnosis Date   Arthritis    Cataract    Bil/ right eye surgery   Diabetes mellitus    type II   Diverticulitis of colon    ED (erectile dysfunction)    Gout    no meds   H/O echocardiogram 2013   normal    History of irregular heartbeat    Per pt, heart skips beats at times/had Echo   Hyperlipidemia    Hypertension    Iritis, recurrent     sees Dr. Edyth at [facility]    Normal cardiac stress test 09/27/2002   Palpitations    PVCs and PACs per event monitor   Uveitis    sees Dr Hughes at [facility] now    Past Surgical History:  Procedure Laterality Date   APPENDECTOMY     CATARACT EXTRACTION     Dr Octavia , left eye/ 2 years ago   CATARACT EXTRACTION  03/16/2017   Right eye   COLONOSCOPY  07/11/2022   per Dr. Legrand, adenomatous polyp, no repeats due to age   GANGLION CYST EXCISION Right  11/29/2021   Procedure: RIGHT REMOVAL GANGLION OF WRIST;  Surgeon: Romona Harari, MD;  Location: [facility];  Service: Orthopedics;  Laterality: Right;   REVERSE SHOULDER ARTHROPLASTY Left 04/15/2023   per Dr. Franky Pointer   SHOULDER ARTHROSCOPY     both shoulders    TONSILLECTOMY     TONSILLECTOMY AND ADENOIDECTOMY     tubes in ears     55 years ago    Family History  Problem Relation Age of Onset   Thyroid  cancer Mother    Heart disease Mother    Heart attack Father 62   Kidney disease Father    Heart disease Father    Thyroid  cancer Brother    Heart attack Paternal Grandmother    Diabetes Paternal Grandfather    Stomach cancer Neg Hx    Colon polyps Neg Hx    Esophageal cancer Neg Hx    Rectal cancer Neg Hx      Current Outpatient Medications:    allopurinol  (ZYLOPRIM ) 100 MG tablet, TAKE 1 TABLET(100 MG) BY MOUTH DAILY, Disp: 90 tablet, Rfl: 0   aspirin  EC 81 MG tablet, Take 1 tablet (81 mg total) by mouth daily. Swallow whole., Disp: 30 tablet, Rfl: 12   brimonidine (  ALPHAGAN) 0.2 % ophthalmic solution, Place 1 drop into both eyes as needed. Used 1 or 2 a year, Disp: , Rfl:    calcium -vitamin D (OSCAL WITH D) 500-200 MG-UNIT per tablet, Take 2 tablets by mouth daily., Disp: , Rfl:    ciprofloxacin  (CIPRO ) 500 MG tablet, Take 1 tablet (500 mg total) by mouth 2 (two) times daily., Disp: 28 tablet, Rfl: 0   colchicine  0.6 MG tablet, Take 1 tablet (0.6 mg total) by mouth every 6 (six) hours as needed (gout)., Disp: 60 tablet, Rfl: 5   CONTOUR NEXT TEST test strip, USE TO TEST BLOOD SUGAR 3 TO 4 TIMES DAILY, Disp: 300 strip, Rfl: 0   finasteride  (PROSCAR ) 5 MG tablet, Take 1 tablet (5 mg total) by mouth daily., Disp: 90 tablet, Rfl: 3   fluorouracil (EFUDEX) 5 % cream, Apply topically., Disp: , Rfl:    FOLIC ACID  PO, Take 10 mg by mouth daily. , Disp: , Rfl:    gabapentin  (NEURONTIN ) 300 MG capsule, TAKE 1 CAPSULE(300 MG) BY MOUTH TWICE DAILY (Patient taking  differently: Taking once daily), Disp: 180 capsule, Rfl: 3   glipiZIDE  (GLUCOTROL ) 10 MG tablet, TAKE 1 TABLET(10 MG) BY MOUTH TWICE DAILY BEFORE A MEAL, Disp: 180 tablet, Rfl: 3   HUMIRA  PEN 40 MG/0.4ML PNKT, SMARTSIG:40 Milligram(s) SUB-Q Every 2 Weeks, Disp: , Rfl:    lisinopril  (ZESTRIL ) 10 MG tablet, TAKE 1 TABLET(10 MG) BY MOUTH DAILY, Disp: 90 tablet, Rfl: 3   MELATONIN PO, Take 20 mg by mouth at bedtime. OTC, Disp: , Rfl:    meloxicam  (MOBIC ) 15 MG tablet, Take 0.5 tablets (7.5 mg total) by mouth daily., Disp: , Rfl:    methotrexate  (RHEUMATREX) 2.5 MG tablet, Take 4 tablets by mouth once a week., Disp: , Rfl:    Microlet Lancets MISC, USE TO TEST DAILY, Disp: 100 each, Rfl: 1   Multiple Vitamin (MULTIVITAMIN) tablet, Take 1 tablet by mouth daily., Disp: , Rfl:    pioglitazone  (ACTOS ) 30 MG tablet, TAKE 1 AND 1/2 TABLETS(45 MG) BY MOUTH DAILY, Disp: 90 tablet, Rfl: 3   rosuvastatin  (CRESTOR ) 20 MG tablet, TAKE 1 TABLET BY MOUTH DAILY, Disp: 90 tablet, Rfl: 1   timolol (TIMOPTIC) 0.5 % ophthalmic solution, 1 drop 2 (two) times daily., Disp: , Rfl:    traMADol  (ULTRAM ) 50 MG tablet, Take 2 tablets (100 mg total) by mouth every 6 (six) hours as needed for severe pain (pain score 7-10)., Disp: 60 tablet, Rfl: 5   zolpidem  (AMBIEN ) 10 MG tablet, Take 1 tablet (10 mg total) by mouth at bedtime., Disp: 90 tablet, Rfl: 1  EXAM:  VITALS per patient if applicable:  GENERAL: alert, oriented, appears well and in no acute distress  HEENT: atraumatic, conjunttiva clear, no obvious abnormalities on inspection of external nose and ears  NECK: normal movements of the head and neck  LUNGS: on inspection no signs of respiratory distress, breathing rate appears normal, no obvious gross SOB, gasping or wheezing  CV: no obvious cyanosis  MS: moves all visible extremities without noticeable abnormality  PSYCH/NEURO: pleasant and cooperative, no obvious depression or anxiety, speech and thought  processing grossly intact  ASSESSMENT AND PLAN: OSA. I explained that his sleep apnea does not go away even if he sleeps more hours at night. This will be a life long problem for him. I advised him to start with a full face mask to see if he feels better. We also discussed options like a nasal pillow or undergoing  the Inspire procedure. He understands.  Garnette Olmsted, MD  Discussed the following assessment and plan:  No diagnosis found.     I discussed the assessment and treatment plan with the patient. The patient was provided an opportunity to ask questions and all were answered. The patient agreed with the plan and demonstrated an understanding of the instructions.   The patient was advised to call back or seek an in-person evaluation if the symptoms worsen or if the condition fails to improve as anticipated.      Review of Systems     Objective:   Physical Exam        Assessment & Plan:

## 2024-03-11 NOTE — Addendum Note (Signed)
 Addended by: MAGDALEN PASCO RAMAN on: 03/11/2024 05:06 PM   Modules accepted: Orders

## 2024-03-11 NOTE — Telephone Encounter (Signed)
 He has severe sleep apnea with a normal BMI (weight for his height) so it is likely more anatomical and unlikely to change.  The only effective option for management of severe sleep apnea, aside from CPAP, is the [facility] device which is a surgical treatment option. However patients have to fail CPAP before this is considered, since it is an invasive procedure requiring general anesthesia.  There are lots of CPAP mask options though, so comfort is usually achievable! Thanks!

## 2024-03-12 DIAGNOSIS — G5762 Lesion of plantar nerve, left lower limb: Secondary | ICD-10-CM | POA: Diagnosis not present

## 2024-03-12 DIAGNOSIS — E119 Type 2 diabetes mellitus without complications: Secondary | ICD-10-CM | POA: Diagnosis not present

## 2024-03-18 ENCOUNTER — Ambulatory Visit (INDEPENDENT_AMBULATORY_CARE_PROVIDER_SITE_OTHER): Admitting: Podiatry

## 2024-03-18 ENCOUNTER — Other Ambulatory Visit: Payer: Self-pay | Admitting: Podiatry

## 2024-03-18 ENCOUNTER — Ambulatory Visit (INDEPENDENT_AMBULATORY_CARE_PROVIDER_SITE_OTHER)

## 2024-03-18 ENCOUNTER — Encounter: Payer: Self-pay | Admitting: Podiatry

## 2024-03-18 DIAGNOSIS — Z9889 Other specified postprocedural states: Secondary | ICD-10-CM | POA: Diagnosis not present

## 2024-03-18 NOTE — Progress Notes (Signed)
 Subjective:  Patient ID: [name] GORMAN [name], male    DOB: 10/07/1945,  MRN: [id number]  Chief Complaint  Patient presents with   Routine Post Op    Left third interspace excision of soft tissue mass. 1 pain. NIDDM A1C 6.3    DOS: 03/12/24 Procedure: Left third interspace excision of soft tissue mass  78 y.o. male returns for POV#1. Relates doing well and managing pain  Review of Systems: Negative except as noted in the HPI. Denies N/V/F/Ch.  Past Medical History:  Diagnosis Date   Arthritis    Cataract    Bil/ right eye surgery   Diabetes mellitus    type II   Diverticulitis of colon    ED (erectile dysfunction)    Gout    no meds   H/O echocardiogram 2013   normal    History of irregular heartbeat    Per pt, heart skips beats at times/had Echo   Hyperlipidemia    Hypertension    Iritis, recurrent     sees Dr. Edyth at [facility]    Normal cardiac stress test 09/27/2002   Palpitations    PVCs and PACs per event monitor   Uveitis    sees Dr Hughes at [facility] Dr [place] now    Current Outpatient Medications:    allopurinol  (ZYLOPRIM ) 100 MG tablet, TAKE 1 TABLET(100 MG) BY MOUTH DAILY, Disp: 90 tablet, Rfl: 0   aspirin  EC 81 MG tablet, Take 1 tablet (81 mg total) by mouth daily. Swallow whole., Disp: 30 tablet, Rfl: 12   brimonidine (ALPHAGAN) 0.2 % ophthalmic solution, Place 1 drop into both eyes as needed. Used 1 or 2 a year, Disp: , Rfl:    calcium -vitamin D (OSCAL WITH D) 500-200 MG-UNIT per tablet, Take 2 tablets by mouth daily., Disp: , Rfl:    ciprofloxacin  (CIPRO ) 500 MG tablet, Take 1 tablet (500 mg total) by mouth 2 (two) times daily., Disp: 28 tablet, Rfl: 0   colchicine  0.6 MG tablet, Take 1 tablet (0.6 mg total) by mouth every 6 (six) hours as needed (gout)., Disp: 60 tablet, Rfl: 5   CONTOUR NEXT TEST test strip, USE TO TEST BLOOD SUGAR 3 TO 4 TIMES DAILY, Disp: 300 strip, Rfl: 0   finasteride  (PROSCAR ) 5 MG tablet, Take 1 tablet (5 mg total) by  mouth daily., Disp: 90 tablet, Rfl: 3   fluorouracil (EFUDEX) 5 % cream, Apply topically., Disp: , Rfl:    FOLIC ACID  PO, Take 10 mg by mouth daily. , Disp: , Rfl:    gabapentin  (NEURONTIN ) 300 MG capsule, TAKE 1 CAPSULE(300 MG) BY MOUTH TWICE DAILY (Patient taking differently: Taking once daily), Disp: 180 capsule, Rfl: 3   glipiZIDE  (GLUCOTROL ) 10 MG tablet, TAKE 1 TABLET(10 MG) BY MOUTH TWICE DAILY BEFORE A MEAL, Disp: 180 tablet, Rfl: 3   HUMIRA  PEN 40 MG/0.4ML PNKT, SMARTSIG:40 Milligram(s) SUB-Q Every 2 Weeks, Disp: , Rfl:    lisinopril  (ZESTRIL ) 10 MG tablet, TAKE 1 TABLET(10 MG) BY MOUTH DAILY, Disp: 90 tablet, Rfl: 3   MELATONIN PO, Take 20 mg by mouth at bedtime. OTC, Disp: , Rfl:    meloxicam  (MOBIC ) 15 MG tablet, Take 0.5 tablets (7.5 mg total) by mouth daily., Disp: , Rfl:    methotrexate  (RHEUMATREX) 2.5 MG tablet, Take 4 tablets by mouth once a week., Disp: , Rfl:    Microlet Lancets MISC, USE TO TEST DAILY, Disp: 100 each, Rfl: 1   Multiple Vitamin (MULTIVITAMIN) tablet, Take 1 tablet  by mouth daily., Disp: , Rfl:    pioglitazone  (ACTOS ) 30 MG tablet, TAKE 1 AND 1/2 TABLETS(45 MG) BY MOUTH DAILY, Disp: 90 tablet, Rfl: 3   rosuvastatin  (CRESTOR ) 20 MG tablet, TAKE 1 TABLET BY MOUTH DAILY, Disp: 90 tablet, Rfl: 1   timolol (TIMOPTIC) 0.5 % ophthalmic solution, 1 drop 2 (two) times daily., Disp: , Rfl:    traMADol  (ULTRAM ) 50 MG tablet, Take 2 tablets (100 mg total) by mouth every 6 (six) hours as needed for severe pain (pain score 7-10)., Disp: 60 tablet, Rfl: 5   zolpidem  (AMBIEN ) 10 MG tablet, Take 1 tablet (10 mg total) by mouth at bedtime., Disp: 90 tablet, Rfl: 1  Social History   Tobacco Use  Smoking Status Former   Current packs/day: 0.00   Average packs/day: 1 pack/day for 8.0 years (8.0 ttl pk-yrs)   Types: Cigarettes   Start date: 02/06/1962   Quit date: 02/06/1970   Years since quitting: 54.1  Smokeless Tobacco Never    Allergies  Allergen Reactions   Contrast  Media [Iodinated Contrast Media] Hives and Itching    Hives and itching after IV contrast injection   Red Dye #40 (Allura Red)     hives   Fluorescein Other (See Comments)   Ioxaglate Itching and Hives    Hives and itching after IV contrast injection   Objective:  There were no vitals filed for this visit. There is no height or weight on file to calculate BMI. Constitutional Well developed. Well nourished.  Vascular Foot warm and well perfused. Capillary refill normal to all digits.   Neurologic Normal speech. Oriented to person, place, and time. Epicritic sensation to light touch grossly present bilaterally.  Dermatologic Skin healing well without signs of infection. Skin edges well coapted without signs of infection.  Orthopedic: Tenderness to palpation noted about the surgical site.   Radiographs: No interval change.  Assessment:   1. Post-operative state    Plan:  Patient was evaluated and treated and all questions answered.  S/p foot surgery left -Progressing as expected post-operatively. -WB Status: WBAT in surgical shoe -Sutures: intact. -Medications: n/a -Foot redressed.  Return in 2 weeks with Dr. Magdalen.   No follow-ups on file.

## 2024-03-20 ENCOUNTER — Other Ambulatory Visit: Payer: Self-pay | Admitting: Podiatry

## 2024-03-21 ENCOUNTER — Ambulatory Visit: Payer: Self-pay | Admitting: Podiatry

## 2024-03-21 NOTE — Progress Notes (Signed)
 thanks

## 2024-03-25 ENCOUNTER — Encounter: Admitting: Podiatry

## 2024-03-25 DIAGNOSIS — M25811 Other specified joint disorders, right shoulder: Secondary | ICD-10-CM | POA: Diagnosis not present

## 2024-03-25 DIAGNOSIS — M25511 Pain in right shoulder: Secondary | ICD-10-CM | POA: Diagnosis not present

## 2024-03-26 DIAGNOSIS — G4733 Obstructive sleep apnea (adult) (pediatric): Secondary | ICD-10-CM | POA: Diagnosis not present

## 2024-03-28 ENCOUNTER — Telehealth: Payer: Self-pay

## 2024-03-28 NOTE — Telephone Encounter (Addendum)
 Received CMN. Signed and faxed to [facility] [place]  [facility]. Fax was successful.

## 2024-04-01 ENCOUNTER — Ambulatory Visit (INDEPENDENT_AMBULATORY_CARE_PROVIDER_SITE_OTHER)

## 2024-04-01 ENCOUNTER — Encounter: Payer: Self-pay | Admitting: Podiatry

## 2024-04-01 ENCOUNTER — Ambulatory Visit (INDEPENDENT_AMBULATORY_CARE_PROVIDER_SITE_OTHER): Admitting: Podiatry

## 2024-04-01 VITALS — Ht 73.0 in | Wt 202.0 lb

## 2024-04-01 DIAGNOSIS — G5762 Lesion of plantar nerve, left lower limb: Secondary | ICD-10-CM | POA: Diagnosis not present

## 2024-04-01 DIAGNOSIS — M25811 Other specified joint disorders, right shoulder: Secondary | ICD-10-CM | POA: Diagnosis not present

## 2024-04-01 DIAGNOSIS — M25511 Pain in right shoulder: Secondary | ICD-10-CM | POA: Diagnosis not present

## 2024-04-01 NOTE — Progress Notes (Signed)
 Subjective:   Patient ID: [name] [name]., male   DOB: 78 y.o.   MRN: [id number]   HPI Patient presents stating doing very well with surgery pleased and feeling no pain   ROS      Objective:  Physical Exam  Neurovascular status intact with patient's third interspace left healing well wound edges well coapted and slight numbness in between the digits     Assessment:  Neuroma like symptomatology left that responded well to neurectomy with good healing of the incision site     Plan:  Ankle compression stocking administered and patient may return to shoe gear and continue elevation.  Patient will be seen back as needed should be uneventful he will  Precautionary x-ray taken and indicated no indications of pathology associated with this condition

## 2024-04-08 DIAGNOSIS — M25511 Pain in right shoulder: Secondary | ICD-10-CM | POA: Diagnosis not present

## 2024-04-08 DIAGNOSIS — M25811 Other specified joint disorders, right shoulder: Secondary | ICD-10-CM | POA: Diagnosis not present

## 2024-04-09 ENCOUNTER — Telehealth: Payer: Self-pay

## 2024-04-09 NOTE — Telephone Encounter (Signed)
 Was doing chart checks and noticed patient has begun cpap already only has data from beginning at 7/19,it says to follow up on study would it be appropriate to push back appointment for that 31-90 window of data?

## 2024-04-10 ENCOUNTER — Telehealth: Payer: Self-pay

## 2024-04-10 NOTE — Telephone Encounter (Signed)
 Attempted to call patient to reschedule appointment for further out so that we have at least 31 days of data.left detailed message will send mychart message,if patient calls back please reschedule him with Izetta Rouleau for late August or mid September virtual visit will be fine as well if able,

## 2024-04-10 NOTE — Telephone Encounter (Signed)
 Yes, please reschedule so we have at least 31 days of data but keep in the 90 day window. Thanks!

## 2024-04-11 ENCOUNTER — Encounter (HOSPITAL_BASED_OUTPATIENT_CLINIC_OR_DEPARTMENT_OTHER): Payer: Self-pay

## 2024-04-11 ENCOUNTER — Ambulatory Visit (HOSPITAL_BASED_OUTPATIENT_CLINIC_OR_DEPARTMENT_OTHER): Admitting: Nurse Practitioner

## 2024-04-11 NOTE — Telephone Encounter (Signed)
 Patient has been rescheduled.NFN

## 2024-04-12 ENCOUNTER — Other Ambulatory Visit: Payer: Self-pay | Admitting: Family Medicine

## 2024-04-15 DIAGNOSIS — M25511 Pain in right shoulder: Secondary | ICD-10-CM | POA: Diagnosis not present

## 2024-04-15 DIAGNOSIS — M25811 Other specified joint disorders, right shoulder: Secondary | ICD-10-CM | POA: Diagnosis not present

## 2024-04-16 ENCOUNTER — Other Ambulatory Visit: Payer: Self-pay | Admitting: Family Medicine

## 2024-04-24 ENCOUNTER — Other Ambulatory Visit: Payer: Self-pay | Admitting: Family Medicine

## 2024-04-26 DIAGNOSIS — G4733 Obstructive sleep apnea (adult) (pediatric): Secondary | ICD-10-CM | POA: Diagnosis not present

## 2024-04-30 DIAGNOSIS — M5416 Radiculopathy, lumbar region: Secondary | ICD-10-CM | POA: Diagnosis not present

## 2024-05-06 DIAGNOSIS — M25811 Other specified joint disorders, right shoulder: Secondary | ICD-10-CM | POA: Diagnosis not present

## 2024-05-06 DIAGNOSIS — M25511 Pain in right shoulder: Secondary | ICD-10-CM | POA: Diagnosis not present

## 2024-05-08 DIAGNOSIS — H6121 Impacted cerumen, right ear: Secondary | ICD-10-CM | POA: Diagnosis not present

## 2024-05-08 DIAGNOSIS — H903 Sensorineural hearing loss, bilateral: Secondary | ICD-10-CM | POA: Diagnosis not present

## 2024-05-08 DIAGNOSIS — Z974 Presence of external hearing-aid: Secondary | ICD-10-CM | POA: Diagnosis not present

## 2024-05-09 ENCOUNTER — Encounter: Payer: Self-pay | Admitting: Family Medicine

## 2024-05-14 MED ORDER — COLCHICINE 0.6 MG PO TABS: 0.6000 mg | ORAL_TABLET | Freq: Four times a day (QID) | ORAL | 5 refills | Status: DC | PRN

## 2024-05-14 NOTE — Telephone Encounter (Signed)
 Done

## 2024-06-03 ENCOUNTER — Other Ambulatory Visit: Payer: Self-pay | Admitting: Family Medicine

## 2024-06-04 ENCOUNTER — Other Ambulatory Visit: Payer: Self-pay | Admitting: Family Medicine

## 2024-06-04 DIAGNOSIS — E119 Type 2 diabetes mellitus without complications: Secondary | ICD-10-CM | POA: Diagnosis not present

## 2024-06-04 DIAGNOSIS — H30033 Focal chorioretinal inflammation, peripheral, bilateral: Secondary | ICD-10-CM | POA: Diagnosis not present

## 2024-06-04 DIAGNOSIS — H3581 Retinal edema: Secondary | ICD-10-CM | POA: Diagnosis not present

## 2024-06-04 DIAGNOSIS — H44113 Panuveitis, bilateral: Secondary | ICD-10-CM | POA: Diagnosis not present

## 2024-06-04 DIAGNOSIS — Z961 Presence of intraocular lens: Secondary | ICD-10-CM | POA: Diagnosis not present

## 2024-06-06 ENCOUNTER — Other Ambulatory Visit: Payer: Self-pay | Admitting: Family Medicine

## 2024-06-12 ENCOUNTER — Ambulatory Visit

## 2024-06-14 ENCOUNTER — Ambulatory Visit (INDEPENDENT_AMBULATORY_CARE_PROVIDER_SITE_OTHER)

## 2024-06-14 VITALS — Ht 73.0 in | Wt 202.0 lb

## 2024-06-14 DIAGNOSIS — Z9889 Other specified postprocedural states: Secondary | ICD-10-CM

## 2024-06-14 DIAGNOSIS — L600 Ingrowing nail: Secondary | ICD-10-CM | POA: Diagnosis not present

## 2024-06-14 NOTE — Progress Notes (Signed)
 Subjective:  Patient ID: [name] GORMAN [name], male    DOB: 02-06-1946,  MRN: [id number]  Chief Complaint  Patient presents with   Ingrown Toenail    RM 10 Patient is here for ingrown of the right hallux.    78 y.o. male presents with the above complaint.  He states that he has had pain to the lateral aspect of the right hallux nail for a little over a week.  He states that every day he bleeds a small amount.  He denies seeing any purulent drainage.  He recently had a neurectomy on his contralateral foot 03/12/2024 and is doing very well.   Review of Systems: Negative except as noted in the HPI. Denies N/V/F/Ch.  Past Medical History:  Diagnosis Date   Arthritis    Cataract    Bil/ right eye surgery   Diabetes mellitus    type II   Diverticulitis of colon    ED (erectile dysfunction)    Gout    no meds   H/O echocardiogram 2013   normal    History of irregular heartbeat    Per pt, heart skips beats at times/had Echo   Hyperlipidemia    Hypertension    Iritis, recurrent     sees Dr. Edyth at [facility]    Normal cardiac stress test 09/27/2002   Palpitations    PVCs and PACs per event monitor   Uveitis    sees Dr Hughes at [facility] Dr Maree now    Current Outpatient Medications:    allopurinol  (ZYLOPRIM ) 100 MG tablet, TAKE 1 TABLET(100 MG) BY MOUTH DAILY, Disp: 90 tablet, Rfl: 0   aspirin  EC 81 MG tablet, Take 1 tablet (81 mg total) by mouth daily. Swallow whole., Disp: 30 tablet, Rfl: 12   brimonidine (ALPHAGAN) 0.2 % ophthalmic solution, Place 1 drop into both eyes as needed. Used 1 or 2 a year, Disp: , Rfl:    calcium -vitamin D (OSCAL WITH D) 500-200 MG-UNIT per tablet, Take 2 tablets by mouth daily., Disp: , Rfl:    ciprofloxacin  (CIPRO ) 500 MG tablet, Take 1 tablet (500 mg total) by mouth 2 (two) times daily., Disp: 28 tablet, Rfl: 0   colchicine  0.6 MG tablet, Take 1 tablet (0.6 mg total) by mouth every 6 (six) hours as needed (gout)., Disp: 60 tablet, Rfl:  5   CONTOUR NEXT TEST test strip, USE TO TEST BLOOD SUGAR 3 TO 4 TIMES DAILY, Disp: 300 strip, Rfl: 0   finasteride  (PROSCAR ) 5 MG tablet, Take 1 tablet (5 mg total) by mouth daily., Disp: 90 tablet, Rfl: 3   fluorouracil (EFUDEX) 5 % cream, Apply topically., Disp: , Rfl:    FOLIC ACID  PO, Take 10 mg by mouth daily. , Disp: , Rfl:    gabapentin  (NEURONTIN ) 300 MG capsule, TAKE 1 CAPSULE(300 MG) BY MOUTH TWICE DAILY (Patient taking differently: Taking once daily), Disp: 180 capsule, Rfl: 3   glipiZIDE  (GLUCOTROL ) 10 MG tablet, TAKE 1 TABLET(10 MG) BY MOUTH TWICE DAILY BEFORE A MEAL, Disp: 180 tablet, Rfl: 3   HUMIRA  PEN 40 MG/0.4ML PNKT, SMARTSIG:40 Milligram(s) SUB-Q Every 2 Weeks, Disp: , Rfl:    lisinopril  (ZESTRIL ) 10 MG tablet, TAKE 1 TABLET(10 MG) BY MOUTH DAILY, Disp: 90 tablet, Rfl: 3   MELATONIN PO, Take 20 mg by mouth at bedtime. OTC, Disp: , Rfl:    meloxicam  (MOBIC ) 15 MG tablet, TAKE 1 TABLET(15 MG) BY MOUTH DAILY, Disp: 90 tablet, Rfl: 0   methotrexate  (RHEUMATREX)  2.5 MG tablet, Take 4 tablets by mouth once a week., Disp: , Rfl:    Microlet Lancets MISC, USE TO TEST DAILY, Disp: 100 each, Rfl: 1   Multiple Vitamin (MULTIVITAMIN) tablet, Take 1 tablet by mouth daily., Disp: , Rfl:    pioglitazone  (ACTOS ) 30 MG tablet, TAKE 1 TABLET(30 MG) BY MOUTH DAILY, Disp: 90 tablet, Rfl: 3   rosuvastatin  (CRESTOR ) 20 MG tablet, TAKE 1 TABLET BY MOUTH DAILY, Disp: 90 tablet, Rfl: 1   timolol (TIMOPTIC) 0.5 % ophthalmic solution, 1 drop 2 (two) times daily., Disp: , Rfl:    traMADol  (ULTRAM ) 50 MG tablet, Take 2 tablets (100 mg total) by mouth every 6 (six) hours as needed for severe pain (pain score 7-10)., Disp: 60 tablet, Rfl: 5   zolpidem  (AMBIEN ) 10 MG tablet, TAKE 1 TABLET(10 MG) BY MOUTH AT BEDTIME, Disp: 90 tablet, Rfl: 1  Social History   Tobacco Use  Smoking Status Former   Current packs/day: 0.00   Average packs/day: 1 pack/day for 8.0 years (8.0 ttl pk-yrs)   Types: Cigarettes    Start date: 02/06/1962   Quit date: 02/06/1970   Years since quitting: 54.3  Smokeless Tobacco Never    Allergies  Allergen Reactions   Contrast Media [Iodinated Contrast Media] Hives and Itching    Hives and itching after IV contrast injection   Red Dye #40 (Allura Red)     hives   Fluorescein Other (See Comments)   Ioxaglate Itching and Hives    Hives and itching after IV contrast injection   Objective:  There were no vitals filed for this visit. Body mass index is 26.65 kg/m. Constitutional Well developed. Well nourished.  Vascular Dorsalis pedis pulses palpable bilaterally. Posterior tibial pulses palpable bilaterally. Capillary refill normal to all digits.  No cyanosis or clubbing noted. Pedal hair growth normal.  Neurologic Normal speech. Oriented to person, place, and time. Epicritic sensation to light touch grossly present bilaterally.  Dermatologic Painful ingrowing nail at lateral nail borders of the hallux nail right. Mild erythema and edema. No purulence, malodor, streaking or other signs of infection.  No other open wounds. No skin lesions.  Orthopedic: Normal joint ROM without pain or crepitus bilaterally. No visible deformities. No bony tenderness.   Radiographs: None Assessment:   1. Ingrown nail of great toe of right foot   2. Post-operative state    Plan:  Patient was evaluated and treated and all questions answered.  Ingrown Nail, right -Discussed treatment options ranging from surgical to conservative along with risks and benefits of each. Patient expresses understanding. Patient elects to proceed with minor surgery to remove ingrown toenail removal today. Consent reviewed and signed by patient. -Ingrown nail excised. See procedure note. -Educated on post-procedure care including soaking. Written instructions provided and reviewed. -Patient to follow up in 2 weeks for nail check.  Status post left foot neurectomy - Doing well in post operative  state - Continue RICE therapy as needed to decrease swelling  Procedure: Excision of Ingrown Toenail Location: Right 1st toe lateral nail borders. Anesthesia: Lidocaine  1% plain; 1.5 mL and Marcaine  0.5% plain; 1.5 mL, digital block. Skin Prep: Chloraprep  Dressing: Silvadene ; telfa; dry, sterile, compression dressing. Technique: Following skin prep, the toe was exsanguinated and a tourniquet was secured at the base of the toe. The affected nail border was freed, split with a nail splitter, and excised. Chemical matrixectomy was then performed with phenol and irrigated out with alcohol. The tourniquet was then removed and  sterile dressing applied. Disposition: Patient tolerated procedure well. Patient to return in 2 weeks for follow-up.  Post op care: Keep dressing clean, dry, and intact for 24 hours before removing. Soak operative foot and redress BID as printed instructions describe. Patient educated on signs of infection, pt expresses understanding and will proceed for prompt medical care if signs of infection arise.   Return in about 2 weeks (around 06/28/2024).  Prentice Ovens, DPM AACFAS Fellowship Trained Podiatric Surgeon [facility]

## 2024-06-14 NOTE — Patient Instructions (Signed)

## 2024-06-19 ENCOUNTER — Encounter: Payer: Self-pay | Admitting: Family Medicine

## 2024-06-19 ENCOUNTER — Other Ambulatory Visit (HOSPITAL_COMMUNITY): Payer: Self-pay

## 2024-06-19 ENCOUNTER — Telehealth: Payer: Self-pay

## 2024-06-19 ENCOUNTER — Encounter: Payer: Self-pay | Admitting: Nurse Practitioner

## 2024-06-19 ENCOUNTER — Ambulatory Visit: Admitting: Nurse Practitioner

## 2024-06-19 VITALS — BP 122/78 | HR 51 | Temp 98.4°F | Ht 74.0 in | Wt 206.2 lb

## 2024-06-19 DIAGNOSIS — L570 Actinic keratosis: Secondary | ICD-10-CM | POA: Diagnosis not present

## 2024-06-19 DIAGNOSIS — I10 Essential (primary) hypertension: Secondary | ICD-10-CM | POA: Diagnosis not present

## 2024-06-19 DIAGNOSIS — Z85828 Personal history of other malignant neoplasm of skin: Secondary | ICD-10-CM | POA: Diagnosis not present

## 2024-06-19 DIAGNOSIS — L812 Freckles: Secondary | ICD-10-CM | POA: Diagnosis not present

## 2024-06-19 DIAGNOSIS — L821 Other seborrheic keratosis: Secondary | ICD-10-CM | POA: Diagnosis not present

## 2024-06-19 DIAGNOSIS — G4710 Hypersomnia, unspecified: Secondary | ICD-10-CM

## 2024-06-19 DIAGNOSIS — G4733 Obstructive sleep apnea (adult) (pediatric): Secondary | ICD-10-CM

## 2024-06-19 DIAGNOSIS — L723 Sebaceous cyst: Secondary | ICD-10-CM | POA: Diagnosis not present

## 2024-06-19 NOTE — Telephone Encounter (Signed)
 Pharmacy Patient Advocate Encounter   Received notification from Onbase that prior authorization for Tramadol  50 is required/requested.   Insurance verification completed.   The patient is insured through [employer] **[employer]   Per test claim: PA required; PA submitted to above mentioned insurance via Latent Key/confirmation #/EOC [id number] Status is pending

## 2024-06-19 NOTE — Progress Notes (Signed)
 @Patient  ID: [name] GORMAN [name], male    DOB: [birth year], 78 y.o.   MRN: [id number]  Chief Complaint  Patient presents with   Sleep Apnea    Sleep is good.  CPAP working well.  Mask not comfortable.    Referring provider: Johnny Garnette LABOR, MD  HPI: 79 year old male, former smoker followed for severe OSA.  Past medical history significant for hypertension, PVC, DM2, BPH, CKD, gout uveitis on immunosuppression therapy.   TEST/EVENTS:  02/15/2024 HST: AHI 32.5/h, SpO2 low 81%  02/08/2024: Ov with Trigg Delarocha NP for sleep consult  [name] GORMAN [name] Al is a 78 year old male who presents for a sleep consult due to snoring and suspected sleep apnea. He experiences snoring that affects his wife's sleep and has daytime tiredness. He typically gets six to seven hours of sleep, which is an improvement from previously getting only three hours. Often goes to bed early and wakes up early. He woke up at 3 AM today and could not return to sleep but he went to bed at 830 pm last night. His wife has observed him stopping breathing at times during sleep, although this has not occurred recently.  He experiences leg cramps at night, which have been occurring for the past three to four years. These cramps often prompt him to get up and use the restroom, which he does about twice a night. He has had his iron levels, vitamin B levels, blood counts, and electrolytes checked relatively recently. Hbg and RBC were slightly low but iron levels normal. Doesn't necessarily feel like his legs are restless though. He's drinking pickle juice, which seems to be helping.  No history of sleepwalking, drowsy driving, or morning headaches. 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 He does not operate heavy machinery. Weight is up 15 lb over the last two years. No supplemental oxygen use. Lives with his wife.  He has a few alcoholic drinks a week. No excessive caffeine intake.  He takes Ambien , usually 5 mg, and  gabapentin  at night for neuropathy.  [place] 8  06/19/2024: Today - follow up Discussed the use of AI scribe software for clinical note transcription with the patient, who gave verbal consent to proceed.  History of Present Illness [name] [name]. Al is a 78 year old male with sleep apnea who presents for follow up.   After our last visit, he started CPAP therapy for severe sleep apnea. He has been using this nightly. He does feel like he's sleeping longer stretches with it.   He has still been experiencing daytime sleepiness despite consistent use of his CPAP machine for three months. He even feels he wakes up feeling more tired than before, even though he is getting an average of seven hours and ten minutes of sleep per night, sometimes extending to ten hours. He expected an improvement in his energy levels with CPAP use, but this has not occurred.  He and his wife have noticed that he is no longer snoring, and his wife is more satisfied with his sleep. However, the persistent daytime tiredness is unexpected. He recalls needing only four hours of sleep to feel rested during his college years.  He received conflicting letters from his insurance company regarding the approval of his CPAP machine, but it was ultimately approved. He is currently with [employer] and has [employer].  No drowsy driving, sleep parasomnias/paralysis.   05/19/2024-06/17/2024: CPAP 5-15 cmH2O 30/30 days; 97% >4 hr;  average use 7 hr 1 min Pressure 95th 11.9 Leaks 8.2 AHI 3.1    Allergies  Allergen Reactions   Contrast Media [Iodinated Contrast Media] Hives and Itching    Hives and itching after IV contrast injection   Red Dye #40 (Allura Red)     hives   Fluorescein Other (See Comments)   Ioxaglate Itching and Hives    Hives and itching after IV contrast injection    Immunization History  Administered Date(s) Administered   Fluad Quad(high Dose 65+) 05/27/2019, 05/26/2021, 05/30/2022    Fluad Trivalent(High Dose 65+) 06/05/2023   INFLUENZA, HIGH DOSE SEASONAL PF 08/01/2016, 06/12/2018   Influenza Split 09/28/2011, 05/28/2012, 05/30/2013   Influenza Whole 07/19/2007, 06/05/2008, 07/07/2009   Influenza, Seasonal, Injecte, Preservative Fre 05/27/2019   Influenza-Unspecified 06/27/2014, 06/09/2017   Pfizer Covid-19 Vaccine Bivalent Booster 44yrs & up 11/24/2021, 06/08/2022   Pneumococcal Conjugate-13 11/04/2014   Pneumococcal Polysaccharide-23 04/14/2008   Td 09/04/2006   Tdap 06/12/2018   Zoster Recombinant(Shingrix) 10/18/2019, 01/07/2021   Zoster, Live 01/06/2011    Past Medical History:  Diagnosis Date   Arthritis    Cataract    Bil/ right eye surgery   Diabetes mellitus    type II   Diverticulitis of colon    ED (erectile dysfunction)    Gout    no meds   H/O echocardiogram 2013   normal    History of irregular heartbeat    Per pt, heart skips beats at times/had Echo   Hyperlipidemia    Hypertension    Iritis, recurrent     se[facility] Edyth at [facility]    Normal cardiac stress test 09/27/2002   Palpitations    PVCs and PACs per event monitor   Uveitis    sees Dr Hughes at [facility] Dr Maree now    Tobacco History: Social History   Tobacco Use  Smoking Status Former   Current packs/day: 0.00   Average packs/day: 1 pack/day for 8.0 years (8.0 ttl pk-yrs)   Types: Cigarettes   Start date: 02/06/1962   Quit date: 02/06/1970   Years since quitting: 54.4  Smokeless Tobacco Never   Counseling given: Not Answered   Outpatient Medications Prior to Visit  Medication Sig Dispense Refill   allopurinol  (ZYLOPRIM ) 100 MG tablet TAKE 1 TABLET(100 MG) BY MOUTH DAILY 90 tablet 0   aspirin  EC 81 MG tablet Take 1 tablet (81 mg total) by mouth daily. Swallow whole. 30 tablet 12   brimonidine (ALPHAGAN) 0.2 % ophthalmic solution Place 1 drop into both eyes as needed. Used 1 or 2 a year     calcium -vitamin D (OSCAL WITH D) 500-200 MG-UNIT per tablet  Take 2 tablets by mouth daily.     ciprofloxacin  (CIPRO ) 500 MG tablet Take 1 tablet (500 mg total) by mouth 2 (two) times daily. 28 tablet 0   colchicine  0.6 MG tablet Take 1 tablet (0.6 mg total) by mouth every 6 (six) hours as needed (gout). 60 tablet 5   CONTOUR NEXT TEST test strip USE TO TEST BLOOD SUGAR 3 TO 4 TIMES DAILY 300 strip 0   finasteride  (PROSCAR ) 5 MG tablet Take 1 tablet (5 mg total) by mouth daily. 90 tablet 3   fluorouracil (EFUDEX) 5 % cream Apply topically.     FOLIC ACID  PO Take 10 mg by mouth daily.      gabapentin  (NEURONTIN ) 300 MG capsule TAKE 1 CAPSULE(300 MG) BY MOUTH TWICE DAILY (Patient taking differently: Taking once daily) 180  capsule 3   glipiZIDE  (GLUCOTROL ) 10 MG tablet TAKE 1 TABLET(10 MG) BY MOUTH TWICE DAILY BEFORE A MEAL 180 tablet 3   HUMIRA  PEN 40 MG/0.4ML PNKT SMARTSIG:40 Milligram(s) SUB-Q Every 2 Weeks     lisinopril  (ZESTRIL ) 10 MG tablet TAKE 1 TABLET(10 MG) BY MOUTH DAILY 90 tablet 3   MELATONIN PO Take 20 mg by mouth at bedtime. OTC     meloxicam  (MOBIC ) 15 MG tablet TAKE 1 TABLET(15 MG) BY MOUTH DAILY 90 tablet 0   methotrexate  (RHEUMATREX) 2.5 MG tablet Take 4 tablets by mouth once a week.     Microlet Lancets MISC USE TO TEST DAILY 100 each 1   Multiple Vitamin (MULTIVITAMIN) tablet Take 1 tablet by mouth daily.     pioglitazone  (ACTOS ) 30 MG tablet TAKE 1 TABLET(30 MG) BY MOUTH DAILY 90 tablet 3   rosuvastatin  (CRESTOR ) 20 MG tablet TAKE 1 TABLET BY MOUTH DAILY 90 tablet 1   timolol (TIMOPTIC) 0.5 % ophthalmic solution 1 drop 2 (two) times daily.     traMADol  (ULTRAM ) 50 MG tablet Take 2 tablets (100 mg total) by mouth every 6 (six) hours as needed for severe pain (pain score 7-10). 60 tablet 5   zolpidem  (AMBIEN ) 10 MG tablet TAKE 1 TABLET(10 MG) BY MOUTH AT BEDTIME 90 tablet 1   No facility-administered medications prior to visit.     Review of Systems: as above    Physical Exam:  BP 122/78   Pulse (!) 51   Temp 98.4 F (36.9  C) (Oral)   Ht 6' 2 (1.88 m)   Wt 206 lb 3.2 oz (93.5 kg)   SpO2 95% Comment: room air  BMI 26.47 kg/m   GEN: Pleasant, interactive, well-appearing; in no acute distress HEENT:  Normocephalic and atraumatic. PERRLA. Sclera white. Nasal turbinates pink, moist and patent bilaterally. No rhinorrhea present. Oropharynx pink and moist, without exudate or edema. No lesions, ulcerations, or postnasal drip. Mallampati III/IV NECK:  Supple w/ fair ROM. Thyroid  symmetrical with no goiter or nodules palpated.  CV: RRR, no m/r/g PULMONARY:  Unlabored, regular breathing. Clear bilaterally A&P w/o wheezes/rales/rhonchi. No accessory muscle use.  GI: BS present and normoactive. Soft, non-tender to palpation.  MSK: No erythema, warmth or tenderness.  Neuro: A/Ox3. No focal deficits noted.   Skin: Warm, no lesions or rashe Psych: Normal affect and behavior. Judgement and thought content appropriate.     Lab Results:  CBC    Component Value Date/Time   WBC 5.9 11/23/2023 1316   RBC 3.66 (L) 11/23/2023 1316   HGB 12.6 (L) 11/23/2023 1316   HCT 36.9 (L) 11/23/2023 1316   PLT 132.0 (L) 11/23/2023 1316   MCV 100.9 (H) 11/23/2023 1316   MCH 33.3 11/08/2023 2023   MCHC 34.1 11/23/2023 1316   RDW 16.1 (H) 11/23/2023 1316   LYMPHSABS 1.3 11/23/2023 1316   MONOABS 0.5 11/23/2023 1316   EOSABS 0.2 11/23/2023 1316   BASOSABS 0.0 11/23/2023 1316    BMET    Component Value Date/Time   NA 137 11/08/2023 2023   K 4.5 11/08/2023 2023   CL 104 11/08/2023 2023   CO2 21 (L) 11/08/2023 2023   GLUCOSE 162 (H) 11/08/2023 2023   GLUCOSE 140 (H) 09/20/2006 0929   BUN 40 (H) 11/08/2023 2023   CREATININE 1.41 (H) 11/08/2023 2023   CREATININE 1.18 05/21/2020 0833   CALCIUM  9.7 11/08/2023 2023   GFRNONAA 51 (L) 11/08/2023 2023   GFRAA 83 (L) 04/02/2012 1115  BNP No results found for: BNP   Imaging:  No results found.  Administration History     None           No data to display           No results found for: NITRICOXIDE      Assessment & Plan:   No problem-specific Assessment & Plan notes found for this encounter. Assessment and Plan Assessment & Plan Obstructive sleep apnea with persistent hypersomnia  Obstructive sleep apnea is effectively managed with CPAP therapy, crucial for reducing cardiovascular burden. Despite CPAP use, there is persistent excessive daytime sleepiness. CPAP machine settings may need adjustment as the maximum pressure is higher than needed. Surgical options like Elizabeth are available but not recommended as first line as CPAP is effective and non-invasive. No difficulties tolerating PAP therapy at this time. Advised that Inspire unlikely to result in more clinical benefit than CPAP therapy. Encouraged to continue utilizing nightly. Aware of risks of untreated OSA. Understands proper care/use of device. Safe driving practices reviewed.  Reviewed potential of utilizing pharmacological stimulant therapy with Provigil for persistent hypersomnia. Pt would like to discuss with PCP at his annual appt next week. Will follow up with us  if he decides to move forward Discussed sleep hygiene as well.  - Adjust CPAP maximum pressure settings to auto 5-11 cmH2O - Provide handout on Provigil for excessive daytime sleepiness - Reviewed side effect profile of Provigil  - Continue CPAP therapy to alleviate cardiac burden - Set consistent sleep schedule with alarms to regulate sleep duration  Hypertension Hypertension is managed with lisinopril . Blood pressure needs monitoring if Provigil is initiated due to potential elevation. - Monitor blood pressure, especially if Provigil is initiated  Hypersomnia See above      Advised if symptoms do not improve or worsen, to please contact office for sooner follow up or seek emergency care.   I spent 35 minutes of dedicated to the care of this patient on the date of this encounter to include pre-visit review  of records, face-to-face time with the patient discussing conditions above, post visit ordering of testing, clinical documentation with the electronic health record, making appropriate referrals as documented, and communicating necessary findings to members of the patients care team.  Comer LULLA Rouleau, NP 06/19/2024  Pt aware and understands NP's role.

## 2024-06-19 NOTE — Patient Instructions (Signed)
 Continue to use CPAP every night, minimum of 4-6 hours a night.  Change equipment as directed. Wash your tubing with warm soap and water daily, hang to dry. Wash humidifier portion weekly. Use bottled, distilled water and change daily Be aware of reduced alertness and do not drive or operate heavy machinery if experiencing this or drowsiness.  Exercise encouraged, as tolerated. Healthy weight management discussed.  Avoid or decrease alcohol consumption and medications that make you more sleepy, if possible. Notify if persistent daytime sleepiness occurs even with consistent use of PAP therapy.  Change CPAP supplies... Every month Mask cushions and/or nasal pillows CPAP machine filters Every 3 months Mask frame (not including the headgear) CPAP tubing Every 6 months Mask headgear Chin strap (if applicable) Humidifier water tub  Monitor your sleep time and try to avoid sleeping past 8 hours, as this could be making you more tired the next day We can also look at putting you on a medication called Provigil (modafinil) to help combat the daytime sleepiness symptoms. Understand you want to discuss with Dr. Johnny. Let me know if you decide you would like to move forward with this Try to keep a consistent exercise regimen   Change CPAP settings to 5-11 cmH2O  Follow up in 1 year with Katie Clementine Soulliere,NP. If symptoms do not improve or worsen, please contact office for sooner follow up

## 2024-06-21 NOTE — Telephone Encounter (Signed)
 I reviewed [name] [name]'s note, and I agree this would help. We'll discuss at his OV

## 2024-06-23 DIAGNOSIS — I351 Nonrheumatic aortic (valve) insufficiency: Secondary | ICD-10-CM | POA: Insufficient documentation

## 2024-06-23 NOTE — Progress Notes (Unsigned)
 Cardiology Office Note:   Date:  06/26/2024  ID:  [name] GORMAN [name], DOB March 10, 1946, MRN [id number] PCP: Johnny Garnette LABOR, MD  [facility] Providers Cardiologist:  [name] Schilling, MD {  History of Present Illness:   [name] [name]. is a 78 y.o. male who previously saw Dr. Alveta.   He has had palpitations with PVCs noted on monitor in 2022.  Echo has demonstrated minimal AI and MR.    He is now seeing me for the first time.  Looking back through his charty I see that he was in the emergency room in February for shortness of breath.  I reviewed these records.  The etiology was not clear.  He was treated with prednisone and Benadryl .  He did get a CT of his chest which demonstrated some coronary artery calcium .  There was no pulmonary embolism.  Of note he was noted to be in atrial fibrillation but he does not recall ever being told about this.  EKG confirms this.  He since had Morton's neuroma resected.  He has had a little bit of shortness of breath but currently does not complain about that.  Nobody has mentioned A-fib again.  He has not been having any palpitations, presyncope or syncope.  He is not having any PND or orthopnea.  He denies any chest pressure, neck or arm discomfort.  He has had no weight gain or new edema.  ROS: As stated in the HPI and negative for all other systems.  Studies Reviewed:    EKG:   EKG Interpretation Date/Time:  Wednesday June 26 2024 10:42:28 EDT Ventricular Rate:  64 PR Interval:    QRS Duration:  82 QT Interval:  430 QTC Calculation: 443 R Axis:   48  Text Interpretation: Atrial fib When compared with ECG of 08-Nov-2023 19:36,  No significant change since last tracing  Confirmed by [name] [name] ([id number]) on 06/26/2024 10:46:13 AM     Risk Assessment/Calculations:    CHA2DS2-VASc Score = 3   This indicates a 3.2% annual risk of stroke. The patient's score is based upon: CHF History: 0 HTN History: 1 Diabetes History:  0 Stroke History: 0 Vascular Disease History: 0 Age Score: 2 Gender Score: 0    Physical Exam:   VS:  BP 100/62   Pulse (!) 41   Ht 6' 2 (1.88 m)   Wt 210 lb 9.6 oz (95.5 kg)   SpO2 96%   BMI 27.04 kg/m    Wt Readings from Last 3 Encounters:  06/26/24 210 lb 9.6 oz (95.5 kg)  06/19/24 206 lb 3.2 oz (93.5 kg)  06/14/24 202 lb (91.6 kg)     GEN: Well nourished, well developed in no acute distress NECK: No JVD; No carotid bruits CARDIAC: Irregular RR, no murmurs, rubs, gallops RESPIRATORY:  Clear to auscultation without rales, wheezing or rhonchi  ABDOMEN: Soft, non-tender, non-distended EXTREMITIES:  No edema; No deformity   ASSESSMENT AND PLAN:   PVCs:   These are not particularly problematic.  They will be evaluated as below.  HTN: Blood pressure is controlled.  No change in therapy.  Dyslipidemia: LDL late last year was 85 and HDL 56.  No change in therapy at this point.  Elevated A1c: 6.3 in 12/19/2023.  Management per Dr. Annis.  Atrial fibrillation: This was noted in February.  Today he remains in fibrillation..  I am going to check an echocardiogram.  He will be started on Eliquis 5 mg twice daily.  He has no contraindications.  He is borderline for the 2.5 and we will consider changing this in the future based on his creatinine after he turns 79.   I will follow-up with an echocardiogram.  He will get a 3-day ZIO to make sure he has adequate rate control and to see if this is persistent.  Sleep apnea: Uses CPAP.  AI/MR: This will be assessed as above.   CKD: He does have mildly elevated creatinine.  This can be followed by his primary provider.  We discussed this today.     Follow up with me after the above studies.   Signed, [name] Schilling, MD

## 2024-06-26 ENCOUNTER — Ambulatory Visit: Attending: Cardiology | Admitting: Cardiology

## 2024-06-26 ENCOUNTER — Encounter: Payer: Self-pay | Admitting: Cardiology

## 2024-06-26 ENCOUNTER — Other Ambulatory Visit (HOSPITAL_COMMUNITY): Payer: Self-pay

## 2024-06-26 ENCOUNTER — Ambulatory Visit (INDEPENDENT_AMBULATORY_CARE_PROVIDER_SITE_OTHER)

## 2024-06-26 ENCOUNTER — Ambulatory Visit: Payer: Self-pay | Admitting: Cardiology

## 2024-06-26 VITALS — BP 100/62 | HR 41 | Ht 74.0 in | Wt 210.6 lb

## 2024-06-26 DIAGNOSIS — I351 Nonrheumatic aortic (valve) insufficiency: Secondary | ICD-10-CM

## 2024-06-26 DIAGNOSIS — E785 Hyperlipidemia, unspecified: Secondary | ICD-10-CM

## 2024-06-26 DIAGNOSIS — I4891 Unspecified atrial fibrillation: Secondary | ICD-10-CM | POA: Diagnosis not present

## 2024-06-26 DIAGNOSIS — I493 Ventricular premature depolarization: Secondary | ICD-10-CM | POA: Diagnosis not present

## 2024-06-26 DIAGNOSIS — G4733 Obstructive sleep apnea (adult) (pediatric): Secondary | ICD-10-CM | POA: Diagnosis not present

## 2024-06-26 MED ORDER — APIXABAN 5 MG PO TABS
5.0000 mg | ORAL_TABLET | Freq: Two times a day (BID) | ORAL | 3 refills | Status: AC
  Filled 2024-06-26 – 2024-09-07 (×2): qty 180, 90d supply, fill #0

## 2024-06-26 MED ORDER — APIXABAN 5 MG PO TABS
5.0000 mg | ORAL_TABLET | Freq: Two times a day (BID) | ORAL | 3 refills | Status: DC
Start: 2024-06-26 — End: 2024-06-26

## 2024-06-26 NOTE — Patient Instructions (Addendum)
 Medication Instructions:  Start Eliquis 5 mg twice daily Stop Aspirin  *If you need a refill on your cardiac medications before your next appointment, please call your pharmacy*  Lab Work: NONE If you have labs (blood work) drawn today and your tests are completely normal, you will receive your results only by: MyChart Message (if you have MyChart) OR A paper copy in the mail If you have any lab test that is abnormal or we need to change your treatment, we will call you to review the results.  Testing/Procedures: Echocardiogram Your physician has requested that you have an echocardiogram. Echocardiography is a painless test that uses sound waves to create images of your heart. It provides your doctor with information about the size and shape of your heart and how well your heart's chambers and valves are working. This procedure takes approximately one hour. There are no restrictions for this procedure. Please do NOT wear cologne, perfume, aftershave, or lotions (deodorant is allowed). Please arrive 15 minutes prior to your appointment time.  Please note: We ask at that you not bring children with you during ultrasound (echo/ vascular) testing. Due to room size and safety concerns, children are not allowed in the ultrasound rooms during exams. Our front office staff cannot provide observation of children in our lobby area while testing is being conducted. An adult accompanying a patient to their appointment will only be allowed in the ultrasound room at the discretion of the ultrasound technician under special circumstances. We apologize for any inconvenience.  3 Day Zio Heart Monitor Your physician has requested that you wear a Zio heart monitor for ___3__ days. This will be mailed to your home with instructions on how to apply the monitor and how to return it when finished. Please allow 2 weeks after returning the heart monitor before our office calls you with the results.   Follow-Up: At  [facility], you and your health needs are our priority.  As part of our continuing mission to provide you with exceptional heart care, our providers are all part of one team.  This team includes your primary Cardiologist (physician) and Advanced Practice Providers or APPs (Physician Assistants and Nurse Practitioners) who all work together to provide you with the care you need, when you need it.  Your next appointment:   After Echocardiogram  Provider:   Lavona, MD  We recommend signing up for the patient portal called MyChart.  Sign up information is provided on this After Visit Summary.  MyChart is used to connect with patients for Virtual Visits (Telemedicine).  Patients are able to view lab/test results, encounter notes, upcoming appointments, etc.  Non-urgent messages can be sent to your provider as well.   To learn more about what you can do with MyChart, go to [email or web address].   Other Instructions  ZIO XT- Long Term Monitor Instructions  Your physician has requested you wear a ZIO patch monitor for 3 days.  This is a single patch monitor. [employer] supplies one patch monitor per enrollment. Additional stickers are not available. Please do not apply patch if you will be having a Nuclear Stress Test,  Echocardiogram, Cardiac CT, MRI, or Chest Xray during the period you would be wearing the  monitor. The patch cannot be worn during these tests. You cannot remove and re-apply the  ZIO XT patch monitor.  Your ZIO patch monitor will be mailed 3 day USPS to your address on file. It may take 3-5 days  to receive your monitor after  you have been enrolled.  Once you have received your monitor, please review the enclosed instructions. Your monitor  has already been registered assigning a specific monitor serial # to you.  Billing and Patient Assistance Program Information  We have supplied [employer] with any of your insurance information on file for billing  purposes. [employer] offers a sliding scale Patient Assistance Program for patients that do not have  insurance, or whose insurance does not completely cover the cost of the ZIO monitor.  You must apply for the Patient Assistance Program to qualify for this discounted rate.  To apply, please call [employer] at [phone], select option 4, select option 2, ask to apply for  Patient Assistance Program. Meredeth will ask your household income, and how many people  are in your household. They will quote your out-of-pocket cost based on that information.  [employer] will also be able to set up a 60-month, interest-free payment plan if needed.  Applying the monitor   Shave hair from upper left chest.  Hold abrader disc by orange tab. Rub abrader in 40 strokes over the upper left chest as  indicated in your monitor instructions.  Clean area with 4 enclosed alcohol pads. Let dry.  Apply patch as indicated in monitor instructions. Patch will be placed under collarbone on left  side of chest with arrow pointing upward.  Rub patch adhesive wings for 2 minutes. Remove white label marked 1. Remove the white  label marked 2. Rub patch adhesive wings for 2 additional minutes.  While looking in a mirror, press and release button in center of patch. A small green light will  flash 3-4 times. This will be your only indicator that the monitor has been turned on.  Do not shower for the first 24 hours. You may shower after the first 24 hours.  Press the button if you feel a symptom. You will hear a small click. Record Date, Time and  Symptom in the Patient Logbook.  When you are ready to remove the patch, follow instructions on the last 2 pages of Patient  Logbook. Stick patch monitor onto the last page of Patient Logbook.  Place Patient Logbook in the blue and white box. Use locking tab on box and tape box closed  securely. The blue and white box has prepaid postage on it. Please place it in the mailbox as  soon  as possible. Your physician should have your test results approximately 7 days after the  monitor has been mailed back to [facility].  Call [facility] Customer Care at [phone] if you have questions regarding  your ZIO XT patch monitor. Call them immediately if you see an orange light blinking on your  monitor.  If your monitor falls off in less than 4 days, contact our Monitor department at [phone].  If your monitor becomes loose or falls off after 4 days call [employer] at [phone] for  suggestions on securing your monitor

## 2024-06-26 NOTE — Progress Notes (Unsigned)
 Enrolled patient for a 3 day Zio XT monitor to be mailed to patients home

## 2024-06-28 ENCOUNTER — Ambulatory Visit

## 2024-06-28 DIAGNOSIS — E119 Type 2 diabetes mellitus without complications: Secondary | ICD-10-CM

## 2024-06-28 DIAGNOSIS — L600 Ingrowing nail: Secondary | ICD-10-CM | POA: Diagnosis not present

## 2024-06-28 DIAGNOSIS — G629 Polyneuropathy, unspecified: Secondary | ICD-10-CM | POA: Diagnosis not present

## 2024-06-28 MED ORDER — AMOXICILLIN-POT CLAVULANATE 875-125 MG PO TABS: 1.0000 | ORAL_TABLET | Freq: Two times a day (BID) | ORAL | 0 refills | Status: DC

## 2024-06-28 NOTE — Patient Instructions (Signed)

## 2024-06-28 NOTE — Progress Notes (Signed)
 Subjective:  Patient ID: [name] [name] [name] [name]., male    DOB: 1946-06-19,  MRN: [id number]  Chief Complaint  Patient presents with   Ingrown Toenail    Rm22 Patient f/u ingrown nail right great toe/  red, macerated and swollen/ constant pain.    DOS: 06/14/24 Procedure: Lateral partial nail avulsion R Hallux  78 y.o. male returns for post-op check. He has had some erythema and edema to the digit.  Has not done soaks at this point. Separately, he complains of neuropathic pain and is curious about medications for ointments that may help.  Review of Systems: Negative except as noted in the HPI. Denies N/V/F/Ch.  Past Medical History:  Diagnosis Date   Arthritis    Cataract    Bil/ right eye surgery   Diabetes mellitus    type II   Diverticulitis of colon    ED (erectile dysfunction)    Gout    no meds   H/O echocardiogram 2013   normal    History of irregular heartbeat    Per pt, heart skips beats at times/had Echo   Hyperlipidemia    Hypertension    Iritis, recurrent     sees Dr. Edyth at [facility]    Normal cardiac stress test 09/27/2002   Palpitations    PVCs and PACs per event monitor   Uveitis    sees Dr Hughes at [facility] [address] [facility] now    Current Outpatient Medications:    allopurinol  (ZYLOPRIM ) 100 MG tablet, TAKE 1 TABLET(100 MG) BY MOUTH DAILY, Disp: 90 tablet, Rfl: 0   amoxicillin -clavulanate (AUGMENTIN) 875-125 MG tablet, Take 1 tablet by mouth 2 (two) times daily., Disp: 14 tablet, Rfl: 0   apixaban (ELIQUIS) 5 MG TABS tablet, Take 1 tablet (5 mg total) by mouth 2 (two) times daily., Disp: 180 tablet, Rfl: 3   brimonidine (ALPHAGAN) 0.2 % ophthalmic solution, Place 1 drop into both eyes as needed. Used 1 or 2 a year, Disp: , Rfl:    calcium -vitamin D (OSCAL WITH D) 500-200 MG-UNIT per tablet, Take 2 tablets by mouth daily., Disp: , Rfl:    colchicine  0.6 MG tablet, Take 1 tablet (0.6 mg total) by mouth every 6 (six) hours as needed (gout)., Disp: 60  tablet, Rfl: 5   CONTOUR NEXT TEST test strip, USE TO TEST BLOOD SUGAR 3 TO 4 TIMES DAILY, Disp: 300 strip, Rfl: 0   finasteride  (PROSCAR ) 5 MG tablet, Take 1 tablet (5 mg total) by mouth daily., Disp: 90 tablet, Rfl: 3   fluorouracil (EFUDEX) 5 % cream, Apply topically. (Patient taking differently: Apply topically as needed.), Disp: , Rfl:    FOLIC ACID  PO, Take 10 mg by mouth daily. , Disp: , Rfl:    gabapentin  (NEURONTIN ) 300 MG capsule, TAKE 1 CAPSULE(300 MG) BY MOUTH TWICE DAILY, Disp: 180 capsule, Rfl: 3   glipiZIDE  (GLUCOTROL ) 10 MG tablet, TAKE 1 TABLET(10 MG) BY MOUTH TWICE DAILY BEFORE A MEAL, Disp: 180 tablet, Rfl: 3   HUMIRA  PEN 40 MG/0.4ML PNKT, SMARTSIG:40 Milligram(s) SUB-Q Every 2 Weeks, Disp: , Rfl:    lisinopril  (ZESTRIL ) 10 MG tablet, TAKE 1 TABLET(10 MG) BY MOUTH DAILY, Disp: 90 tablet, Rfl: 3   methotrexate  (RHEUMATREX) 2.5 MG tablet, Take 4 tablets by mouth once a week., Disp: , Rfl:    Microlet Lancets MISC, USE TO TEST DAILY, Disp: 100 each, Rfl: 1   Multiple Vitamin (MULTIVITAMIN) tablet, Take 1 tablet by mouth daily., Disp: , Rfl:  pioglitazone  (ACTOS ) 30 MG tablet, TAKE 1 TABLET(30 MG) BY MOUTH DAILY (Patient taking differently: PER PATIENT TAKING 1 TABLET IN THE MORNING AND TAKING 1/2 TABLET AT NIGHT), Disp: 90 tablet, Rfl: 3   rosuvastatin  (CRESTOR ) 20 MG tablet, TAKE 1 TABLET BY MOUTH DAILY, Disp: 90 tablet, Rfl: 1   timolol (TIMOPTIC) 0.5 % ophthalmic solution, 1 drop 2 (two) times daily., Disp: , Rfl:    traMADol  (ULTRAM ) 50 MG tablet, Take 2 tablets (100 mg total) by mouth every 6 (six) hours as needed for severe pain (pain score 7-10)., Disp: 60 tablet, Rfl: 5   UNABLE TO FIND, C-pap using nightly, Disp: , Rfl:    zolpidem  (AMBIEN ) 10 MG tablet, TAKE 1 TABLET(10 MG) BY MOUTH AT BEDTIME (Patient taking differently: PER PATIENT TAKING 1/2 TABLET NIGHTLY), Disp: 90 tablet, Rfl: 1  Social History   Tobacco Use  Smoking Status Former   Current packs/day: 0.00    Average packs/day: 1 pack/day for 8.0 years (8.0 ttl pk-yrs)   Types: Cigarettes   Start date: 02/06/1962   Quit date: 02/06/1970   Years since quitting: 54.4  Smokeless Tobacco Never    Allergies  Allergen Reactions   Contrast Media [Iodinated Contrast Media] Hives and Itching    Hives and itching after IV contrast injection   Red Dye #40 (Allura Red)     hives   Fluorescein Other (See Comments)   Ioxaglate Itching and Hives    Hives and itching after IV contrast injection   Objective:  There were no vitals filed for this visit. There is no height or weight on file to calculate BMI. Constitutional Well developed. Well nourished.  Vascular Foot warm and well perfused. Capillary refill normal to all digits.   Neurologic Normal speech. Subjective description of burning, tingling to his digits Oriented to person, place, and time. Epicritic sensation to light touch grossly present bilaterally.  Dermatologic Evidence of previous partial nail avulsion to the lateral aspect of the right hallux.  There is no sign of regrowing nail.  There is surrounding erythema, edema and tenderness.  No purulence.  Orthopedic: 5 out of 5 muscle strength, no contributing deformity    Assessment:   1. Ingrown nail of great toe of right foot   2. Neuropathy   3. Type 2 diabetes mellitus without complication, without long-term current use of insulin  [facility])    Plan:  Patient was evaluated and treated and all questions answered. Doing well in post operative recovery.   S/p Right toenail surgery  - Patient has some residual erythema, edema and sensitivity after his right partial toenail avulsion.  I discussed with him indication for antibiotic.  Augmentin 875-125 twice daily x 7 days prescribed. - Printed out soaking instructions for him, he will begin doing Epsom salt soaks  Bilateral peripheral neuropathy - Patient has subjective description of burning, tingling in bilateral legs.  It is most severe  at the toes.  He is on gabapentin  per his primary.  We discussed the dosing range of gabapentin .  He does state that it makes him somnolent. - I discussed with him that we can do a peripheral neuropathy cream/ointment.  I did not make any promises about this fully resolving his neuropathy, but it may improve his symptoms. He was provided a prescription for peripheral neuropathy cream at [facility]  RTC 2 weeks

## 2024-07-03 ENCOUNTER — Encounter: Payer: Self-pay | Admitting: Family Medicine

## 2024-07-03 ENCOUNTER — Ambulatory Visit (INDEPENDENT_AMBULATORY_CARE_PROVIDER_SITE_OTHER): Admitting: Family Medicine

## 2024-07-03 VITALS — BP 110/60 | HR 51 | Temp 97.9°F | Ht 74.0 in | Wt 209.0 lb

## 2024-07-03 DIAGNOSIS — Z131 Encounter for screening for diabetes mellitus: Secondary | ICD-10-CM | POA: Diagnosis not present

## 2024-07-03 DIAGNOSIS — Z0000 Encounter for general adult medical examination without abnormal findings: Secondary | ICD-10-CM | POA: Diagnosis not present

## 2024-07-03 DIAGNOSIS — Z23 Encounter for immunization: Secondary | ICD-10-CM

## 2024-07-03 DIAGNOSIS — M25511 Pain in right shoulder: Secondary | ICD-10-CM | POA: Diagnosis not present

## 2024-07-03 DIAGNOSIS — M25811 Other specified joint disorders, right shoulder: Secondary | ICD-10-CM | POA: Diagnosis not present

## 2024-07-03 DIAGNOSIS — Z125 Encounter for screening for malignant neoplasm of prostate: Secondary | ICD-10-CM

## 2024-07-03 DIAGNOSIS — Z13220 Encounter for screening for lipoid disorders: Secondary | ICD-10-CM | POA: Diagnosis not present

## 2024-07-03 LAB — HEPATIC FUNCTION PANEL
ALT: 42 U/L (ref 0–53)
AST: 25 U/L (ref 0–37)
Albumin: 4.4 g/dL (ref 3.5–5.2)
Alkaline Phosphatase: 60 U/L (ref 39–117)
Bilirubin, Direct: 0.2 mg/dL (ref 0.0–0.3)
Total Bilirubin: 0.8 mg/dL (ref 0.2–1.2)
Total Protein: 6.3 g/dL (ref 6.0–8.3)

## 2024-07-03 LAB — CBC WITH DIFFERENTIAL/PLATELET
Basophils Absolute: 0 K/uL (ref 0.0–0.1)
Basophils Relative: 0.7 % (ref 0.0–3.0)
Eosinophils Absolute: 0.3 K/uL (ref 0.0–0.7)
Eosinophils Relative: 6 % — ABNORMAL HIGH (ref 0.0–5.0)
HCT: 43.6 % (ref 39.0–52.0)
Hemoglobin: 14 g/dL (ref 13.0–17.0)
Lymphocytes Relative: 29.3 % (ref 12.0–46.0)
Lymphs Abs: 1.7 K/uL (ref 0.7–4.0)
MCHC: 32.2 g/dL (ref 30.0–36.0)
MCV: 101.9 fl — ABNORMAL HIGH (ref 78.0–100.0)
Monocytes Absolute: 0.6 K/uL (ref 0.1–1.0)
Monocytes Relative: 9.8 % (ref 3.0–12.0)
Neutro Abs: 3.1 K/uL (ref 1.4–7.7)
Neutrophils Relative %: 54.2 % (ref 43.0–77.0)
Platelets: 140 K/uL — ABNORMAL LOW (ref 150.0–400.0)
RBC: 4.28 Mil/uL (ref 4.22–5.81)
RDW: 15.1 % (ref 11.5–15.5)
WBC: 5.7 K/uL (ref 4.0–10.5)

## 2024-07-03 LAB — BASIC METABOLIC PANEL WITH GFR
BUN: 40 mg/dL — ABNORMAL HIGH (ref 6–23)
CO2: 27 meq/L (ref 19–32)
Calcium: 9.1 mg/dL (ref 8.4–10.5)
Chloride: 103 meq/L (ref 96–112)
Creatinine, Ser: 1.45 mg/dL (ref 0.40–1.50)
GFR: 45.98 mL/min — ABNORMAL LOW (ref >60.00)
Glucose, Bld: 165 mg/dL — ABNORMAL HIGH (ref 70–99)
Potassium: 5.4 meq/L — ABNORMAL HIGH (ref 3.5–5.1)
Sodium: 136 meq/L (ref 135–145)

## 2024-07-03 LAB — PSA: PSA: 0.64 ng/mL (ref 0.10–4.00)

## 2024-07-03 LAB — LIPID PANEL
Cholesterol: 135 mg/dL (ref 0–200)
HDL: 47.1 mg/dL (ref >39.00)
LDL Cholesterol: 73 mg/dL (ref 0–99)
NonHDL: 87.91
Total CHOL/HDL Ratio: 3
Triglycerides: 76 mg/dL (ref 0.0–149.0)
VLDL: 15.2 mg/dL (ref 0.0–40.0)

## 2024-07-03 LAB — HEMOGLOBIN A1C: Hgb A1c MFr Bld: 7.5 % — ABNORMAL HIGH (ref 4.6–6.5)

## 2024-07-03 LAB — TSH: TSH: 1.24 u[IU]/mL (ref 0.35–5.50)

## 2024-07-03 NOTE — Addendum Note (Signed)
 Addended by: LADONNA INOCENTE SAILOR on: 07/03/2024 10:31 AM   Modules accepted: Orders

## 2024-07-03 NOTE — Progress Notes (Signed)
 Subjective:    Patient ID: Clem GORMAN [name], male    DOB: 14-Dec-1945, 78 y.o.   MRN: [id number]  HPI Here for a well exam. He has no complaints. He recently saw Dr. Lavona for a cardiology visit, and they discussed his atrial fibrillation. He was started on Eliquis 5 mg BID. His ventricular rate is well controlled. He has never felt any symptoms from this. He wore a Zio patch but the reading is not back yet. He is scheduled for an ECHO in the next few weeks. Otherwise Al has been doing well. He has an eye exam every 3 months.    Review of Systems  Constitutional: Negative.   HENT: Negative.    Eyes: Negative.   Respiratory: Negative.    Cardiovascular: Negative.   Gastrointestinal: Negative.   Genitourinary: Negative.   Musculoskeletal: Negative.   Skin: Negative.   Neurological: Negative.   Psychiatric/Behavioral: Negative.         Objective:   Physical Exam Constitutional:      General: He is not in acute distress.    Appearance: Normal appearance. He is well-developed. He is not diaphoretic.  HENT:     Head: Normocephalic and atraumatic.     Right Ear: External ear normal.     Left Ear: External ear normal.     Nose: Nose normal.     Mouth/Throat:     Pharynx: No oropharyngeal exudate.  Eyes:     General: No scleral icterus.       Right eye: No discharge.        Left eye: No discharge.     Conjunctiva/sclera: Conjunctivae normal.     Pupils: Pupils are equal, round, and reactive to light.  Neck:     Thyroid : No thyromegaly.     Vascular: No JVD.     Trachea: No tracheal deviation.  Cardiovascular:     Rate and Rhythm: Normal rate. Rhythm irregular.     Pulses: Normal pulses.     Heart sounds: Normal heart sounds. No murmur heard.    No friction rub. No gallop.  Pulmonary:     Effort: Pulmonary effort is normal. No respiratory distress.     Breath sounds: Normal breath sounds. No wheezing or rales.  Chest:     Chest wall: No tenderness.  Abdominal:      General: Bowel sounds are normal. There is no distension.     Palpations: Abdomen is soft. There is no mass.     Tenderness: There is no abdominal tenderness. There is no guarding or rebound.  Genitourinary:    Penis: No tenderness.   Musculoskeletal:        General: No tenderness. Normal range of motion.     Cervical back: Neck supple.  Lymphadenopathy:     Cervical: No cervical adenopathy.  Skin:    General: Skin is warm and dry.     Coloration: Skin is not pale.     Findings: No erythema or rash.  Neurological:     General: No focal deficit present.     Mental Status: He is alert and oriented to person, place, and time.     Cranial Nerves: No cranial nerve deficit.     Motor: No abnormal muscle tone.     Coordination: Coordination normal.     Deep Tendon Reflexes: Reflexes are normal and symmetric. Reflexes normal.  Psychiatric:        Mood and Affect: Mood normal.  Behavior: Behavior normal.        Thought Content: Thought content normal.        Judgment: Judgment normal.           Assessment & Plan:  Well exam. We discussed diet and exercise. Get fasting labs.  Garnette Olmsted, MD

## 2024-07-04 ENCOUNTER — Encounter: Payer: Self-pay | Admitting: Family Medicine

## 2024-07-04 ENCOUNTER — Ambulatory Visit: Payer: Self-pay | Admitting: Family Medicine

## 2024-07-04 MED ORDER — SITAGLIPTIN PHOSPHATE 100 MG PO TABS
100.0000 mg | ORAL_TABLET | Freq: Every day | ORAL | 5 refills | Status: DC
  Filled 2024-07-29 – 2024-08-28 (×3): qty 30, 30d supply, fill #0
  Filled 2024-09-07 – 2024-10-02 (×6): qty 30, 30d supply, fill #1

## 2024-07-04 NOTE — Addendum Note (Signed)
 Addended by: JOHNNY SENIOR A on: 07/04/2024 12:17 PM   Modules accepted: Orders

## 2024-07-08 DIAGNOSIS — M25511 Pain in right shoulder: Secondary | ICD-10-CM | POA: Diagnosis not present

## 2024-07-08 DIAGNOSIS — M25811 Other specified joint disorders, right shoulder: Secondary | ICD-10-CM | POA: Diagnosis not present

## 2024-07-09 ENCOUNTER — Encounter: Payer: Self-pay | Admitting: Family Medicine

## 2024-07-09 ENCOUNTER — Other Ambulatory Visit: Payer: Self-pay | Admitting: Medical Genetics

## 2024-07-09 ENCOUNTER — Ambulatory Visit (INDEPENDENT_AMBULATORY_CARE_PROVIDER_SITE_OTHER)

## 2024-07-09 DIAGNOSIS — M7662 Achilles tendinitis, left leg: Secondary | ICD-10-CM

## 2024-07-09 DIAGNOSIS — L600 Ingrowing nail: Secondary | ICD-10-CM

## 2024-07-09 DIAGNOSIS — E119 Type 2 diabetes mellitus without complications: Secondary | ICD-10-CM | POA: Diagnosis not present

## 2024-07-09 MED ORDER — METHYLPREDNISOLONE 4 MG PO TBPK: ORAL_TABLET | ORAL | 0 refills | Status: DC

## 2024-07-09 NOTE — Progress Notes (Signed)
 Subjective:  Patient ID: [name] [name]., male    DOB: Feb 19, 1946,  MRN: [id number]  Chief Complaint  Patient presents with   Ingrown Toenail    Rm12 Ingrown nail check right great toe/ patient says he is doing well.    DOS: 06/14/24 Procedure: Lateral partial nail avulsion R Hallux  78 y.o. male returns for repeat postop check of his right hallux lateral nail avulsion.  This area has improved after taking Augmentin x 7 days.  Granular tissue forming, decreased in erythema.  Separately, he does complain of pain to his left Achilles tendon proximal to the insertion.  He states that this developed recently without injury or change in activity.  He is in physical therapy for other reasons and they are working with him on this.  Review of Systems: Negative except as noted in the HPI. Denies N/V/F/Ch.  Past Medical History:  Diagnosis Date   Arthritis    Cataract    Bil/ right eye surgery   Diabetes mellitus    type II   Diverticulitis of colon    ED (erectile dysfunction)    Gout    no meds   H/O echocardiogram 2013   normal    History of irregular heartbeat    Per pt, heart skips beats at times/had Echo   Hyperlipidemia    Hypertension    Iritis, recurrent     sees Dr. Edyth at [facility]    Normal cardiac stress test 09/27/2002   Palpitations    PVCs and PACs per event monitor   Uveitis    sees Dr Hughes at [facility] Dr [place] now    Current Outpatient Medications:    allopurinol  (ZYLOPRIM ) 100 MG tablet, TAKE 1 TABLET(100 MG) BY MOUTH DAILY, Disp: 90 tablet, Rfl: 0   amoxicillin -clavulanate (AUGMENTIN) 875-125 MG tablet, Take 1 tablet by mouth 2 (two) times daily., Disp: 14 tablet, Rfl: 0   apixaban (ELIQUIS) 5 MG TABS tablet, Take 1 tablet (5 mg total) by mouth 2 (two) times daily., Disp: 180 tablet, Rfl: 3   brimonidine (ALPHAGAN) 0.2 % ophthalmic solution, Place 1 drop into both eyes as needed. Used 1 or 2 a year, Disp: , Rfl:    colchicine  0.6 MG tablet,  Take 1 tablet (0.6 mg total) by mouth every 6 (six) hours as needed (gout)., Disp: 60 tablet, Rfl: 5   CONTOUR NEXT TEST test strip, USE TO TEST BLOOD SUGAR 3 TO 4 TIMES DAILY, Disp: 300 strip, Rfl: 0   finasteride  (PROSCAR ) 5 MG tablet, Take 1 tablet (5 mg total) by mouth daily., Disp: 90 tablet, Rfl: 3   fluorouracil (EFUDEX) 5 % cream, Apply topically. (Patient taking differently: Apply topically as needed.), Disp: , Rfl:    FOLIC ACID  PO, Take 10 mg by mouth daily. , Disp: , Rfl:    gabapentin  (NEURONTIN ) 300 MG capsule, TAKE 1 CAPSULE(300 MG) BY MOUTH TWICE DAILY, Disp: 180 capsule, Rfl: 3   glipiZIDE  (GLUCOTROL ) 10 MG tablet, TAKE 1 TABLET(10 MG) BY MOUTH TWICE DAILY BEFORE A MEAL, Disp: 180 tablet, Rfl: 3   HUMIRA  PEN 40 MG/0.4ML PNKT, SMARTSIG:40 Milligram(s) SUB-Q Every 2 Weeks, Disp: , Rfl:    lisinopril  (ZESTRIL ) 10 MG tablet, TAKE 1 TABLET(10 MG) BY MOUTH DAILY, Disp: 90 tablet, Rfl: 3   methotrexate  (RHEUMATREX) 2.5 MG tablet, Take 4 tablets by mouth once a week., Disp: , Rfl:    methylPREDNISolone  (MEDROL  DOSEPAK) 4 MG TBPK tablet, Tapered dose pack for achilles tendonitis, Disp:  1 each, Rfl: 0   Microlet Lancets MISC, USE TO TEST DAILY, Disp: 100 each, Rfl: 1   Multiple Vitamin (MULTIVITAMIN) tablet, Take 1 tablet by mouth daily., Disp: , Rfl:    pioglitazone  (ACTOS ) 30 MG tablet, TAKE 1 TABLET(30 MG) BY MOUTH DAILY (Patient taking differently: PER PATIENT TAKING 1 TABLET IN THE MORNING AND TAKING 1/2 TABLET AT NIGHT), Disp: 90 tablet, Rfl: 3   rosuvastatin  (CRESTOR ) 20 MG tablet, TAKE 1 TABLET BY MOUTH DAILY, Disp: 90 tablet, Rfl: 1   sitaGLIPtin (JANUVIA) 100 MG tablet, Take 1 tablet (100 mg total) by mouth daily., Disp: 30 tablet, Rfl: 5   timolol (TIMOPTIC) 0.5 % ophthalmic solution, 1 drop 2 (two) times daily., Disp: , Rfl:    traMADol  (ULTRAM ) 50 MG tablet, Take 2 tablets (100 mg total) by mouth every 6 (six) hours as needed for severe pain (pain score 7-10)., Disp: 60 tablet,  Rfl: 5   UNABLE TO FIND, C-pap using nightly, Disp: , Rfl:    zolpidem  (AMBIEN ) 10 MG tablet, TAKE 1 TABLET(10 MG) BY MOUTH AT BEDTIME (Patient taking differently: PER PATIENT TAKING 1/2 TABLET NIGHTLY), Disp: 90 tablet, Rfl: 1   calcium -vitamin D (OSCAL WITH D) 500-200 MG-UNIT per tablet, Take 2 tablets by mouth daily., Disp: , Rfl:   Social History   Tobacco Use  Smoking Status Former   Current packs/day: 0.00   Average packs/day: 1 pack/day for 8.0 years (8.0 ttl pk-yrs)   Types: Cigarettes   Start date: 02/06/1962   Quit date: 02/06/1970   Years since quitting: 54.4  Smokeless Tobacco Never    Allergies  Allergen Reactions   Contrast Media [Iodinated Contrast Media] Hives and Itching    Hives and itching after IV contrast injection   Red Dye #40 (Allura Red)     hives   Fluorescein Other (See Comments)   Ioxaglate Itching and Hives    Hives and itching after IV contrast injection   Objective:  There were no vitals filed for this visit. There is no height or weight on file to calculate BMI. Constitutional Well developed. Well nourished.  Vascular Foot warm and well perfused. Capillary refill normal to all digits.   Neurologic Normal speech. Subjective description of burning, tingling to his digits Oriented to person, place, and time. Epicritic sensation to light touch grossly present bilaterally.  Dermatologic Evidence of previous partial nail avulsion to the lateral aspect of the right hallux.  There is no sign of regrowing nail.  Granular wound bed.  No open wounds or lesions.  Minimal erythema, edema.  No purulence.  Orthopedic: 5 out of 5 muscle strength.  Moderate pain to palpation left Achilles tendon 3 cm proximal to the insertion with mild fusiform thickening.  Good muscle strength, no dell.    Assessment:   1. Ingrown nail of great toe of right foot   2. Type 2 diabetes mellitus without complication, without long-term current use of insulin  (HCC)   3.  Tendonitis, Achilles, left     Plan:  Patient was evaluated and treated and all questions answered. Doing well in post operative recovery.   S/p Right toenail surgery  - Doing well, continuing to heal with granular wound bed.  Continue soaks. No further indication for abx.   Left midsubstance Achilles tendinitis - Discussed with the patient the diagnosis of Achilles tendinitis.  We did discuss different treatment options.  He did not experience any type of injury and has not had any difficulty ambulating. -  Discussed use of Medrol  Dosepak to decrease acute inflammation and pain.  He does express understanding of this and wishes to proceed.  Prescription placed. - Perform Achilles tendon strengthening and stretching and physical therapy that patient is already in. - If patient continues to have achilles tendonitis symptoms, pre-clinical XR of left foot/ankle at next visit.   RTC PRN  Prentice Ovens, DPM

## 2024-07-09 NOTE — Telephone Encounter (Signed)
 FYI

## 2024-07-14 DIAGNOSIS — I4891 Unspecified atrial fibrillation: Secondary | ICD-10-CM

## 2024-07-18 ENCOUNTER — Other Ambulatory Visit (HOSPITAL_COMMUNITY): Payer: Self-pay

## 2024-07-22 DIAGNOSIS — H401123 Primary open-angle glaucoma, left eye, severe stage: Secondary | ICD-10-CM | POA: Diagnosis not present

## 2024-07-22 DIAGNOSIS — H1711 Central corneal opacity, right eye: Secondary | ICD-10-CM | POA: Diagnosis not present

## 2024-07-22 DIAGNOSIS — H20021 Recurrent acute iridocyclitis, right eye: Secondary | ICD-10-CM | POA: Diagnosis not present

## 2024-07-22 DIAGNOSIS — H401112 Primary open-angle glaucoma, right eye, moderate stage: Secondary | ICD-10-CM | POA: Diagnosis not present

## 2024-07-28 ENCOUNTER — Encounter: Payer: Self-pay | Admitting: Family Medicine

## 2024-07-29 ENCOUNTER — Encounter: Payer: Self-pay | Admitting: Nurse Practitioner

## 2024-07-29 ENCOUNTER — Other Ambulatory Visit: Payer: Self-pay

## 2024-07-29 ENCOUNTER — Other Ambulatory Visit (HOSPITAL_COMMUNITY): Payer: Self-pay

## 2024-07-29 MED ORDER — BRIMONIDINE TARTRATE 0.2 % OP SOLN: 1.0000 [drp] | Freq: Three times a day (TID) | OPHTHALMIC | 11 refills | Status: DC

## 2024-07-29 MED ORDER — PIOGLITAZONE HCL 30 MG PO TABS: 45.0000 mg | ORAL_TABLET | Freq: Every day | ORAL | 1 refills | Status: DC

## 2024-07-29 MED ORDER — AMOXICILLIN 500 MG PO CAPS
2000.0000 mg | ORAL_CAPSULE | ORAL | 0 refills | Status: DC
  Filled 2024-07-29: qty 20, 5d supply, fill #0

## 2024-07-29 MED ORDER — APIXABAN 5 MG PO TABS: 5.0000 mg | ORAL_TABLET | Freq: Two times a day (BID) | ORAL | 3 refills | Status: DC

## 2024-07-29 MED ORDER — FOLIC ACID 1 MG PO TABS
1.0000 mg | ORAL_TABLET | Freq: Every day | ORAL | 5 refills | Status: DC
  Filled 2024-07-29: qty 90, 90d supply, fill #0

## 2024-07-29 MED ORDER — TIMOLOL MALEATE 0.5 % OP SOLN
1.0000 [drp] | Freq: Every day | OPHTHALMIC | 11 refills | Status: AC
  Filled 2024-07-29 – 2024-09-27 (×5): qty 5, 50d supply, fill #0

## 2024-07-29 MED ORDER — COLCHICINE 0.6 MG PO TABS
0.6000 mg | ORAL_TABLET | Freq: Four times a day (QID) | ORAL | 5 refills | Status: DC | PRN
  Filled 2024-07-29: qty 60, 15d supply, fill #0

## 2024-07-29 MED ORDER — METHOTREXATE SODIUM 2.5 MG PO TABS: 10.0000 mg | ORAL_TABLET | ORAL | 5 refills | Status: DC

## 2024-07-29 MED FILL — Glipizide Tab 10 MG: ORAL | 90 days supply | Qty: 180 | Fill #0 | Status: AC

## 2024-07-29 MED FILL — Pioglitazone HCl Tab 30 MG (Base Equiv): ORAL | 90 days supply | Qty: 90 | Fill #0 | Status: AC

## 2024-07-29 MED FILL — Lisinopril Tab 10 MG: ORAL | 90 days supply | Qty: 90 | Fill #0 | Status: CN

## 2024-07-30 ENCOUNTER — Other Ambulatory Visit: Payer: Self-pay

## 2024-07-31 ENCOUNTER — Other Ambulatory Visit (HOSPITAL_COMMUNITY): Payer: Self-pay

## 2024-07-31 ENCOUNTER — Encounter (HOSPITAL_COMMUNITY): Payer: Self-pay | Admitting: Pharmacist

## 2024-08-01 DIAGNOSIS — M5416 Radiculopathy, lumbar region: Secondary | ICD-10-CM | POA: Diagnosis not present

## 2024-08-05 ENCOUNTER — Ambulatory Visit (INDEPENDENT_AMBULATORY_CARE_PROVIDER_SITE_OTHER): Admitting: Family Medicine

## 2024-08-05 ENCOUNTER — Encounter: Payer: Self-pay | Admitting: Family Medicine

## 2024-08-05 VITALS — BP 110/60 | HR 54 | Temp 97.5°F | Wt 210.0 lb

## 2024-08-05 DIAGNOSIS — M7662 Achilles tendinitis, left leg: Secondary | ICD-10-CM | POA: Diagnosis not present

## 2024-08-05 NOTE — Progress Notes (Signed)
   Subjective:    Patient ID: [name] [name]., male    DOB: 1945-09-28, 78 y.o.   MRN: [id number]  HPI Here for advice about pain in the left Achilles tendon. This started about 2 months ago, no hx of trauma. It never swells. He has been icing it, stretching it, and taking Ibuprofen with no relief. He has tried applying Voltaren  gel. He saw Podiatry 2 weeks ago, and they gave him a Prednisone taper pack. This has not helped at all.    Review of Systems  Constitutional: Negative.   Respiratory: Negative.    Cardiovascular: Negative.   Musculoskeletal:  Positive for arthralgias.       Objective:   Physical Exam Constitutional:      Comments: He walks with a limp   Cardiovascular:     Rate and Rhythm: Normal rate and regular rhythm.     Pulses: Normal pulses.     Heart sounds: Normal heart sounds.  Pulmonary:     Effort: Pulmonary effort is normal.     Breath sounds: Normal breath sounds.  Musculoskeletal:     Comments: He is tender in the posterior left heel. No erythema or warmth or swelling.   Neurological:     Mental Status: He is alert.           Assessment & Plan:  Left Achilles tendonitis. I told him that he has tried everything that I would have recommended. I advised him to follow up with Podiatry for more aggressive treatment. Garnette Olmsted, MD

## 2024-08-06 ENCOUNTER — Ambulatory Visit: Payer: PRIVATE HEALTH INSURANCE

## 2024-08-06 ENCOUNTER — Ambulatory Visit (INDEPENDENT_AMBULATORY_CARE_PROVIDER_SITE_OTHER): Payer: PRIVATE HEALTH INSURANCE

## 2024-08-06 DIAGNOSIS — M7662 Achilles tendinitis, left leg: Secondary | ICD-10-CM

## 2024-08-07 ENCOUNTER — Ambulatory Visit (HOSPITAL_COMMUNITY)
Admission: RE | Admit: 2024-08-07 | Discharge: 2024-08-07 | Disposition: A | Discharge status: Home / Self Care | Source: Ambulatory Visit | Attending: Cardiology | Admitting: Cardiology

## 2024-08-07 DIAGNOSIS — I351 Nonrheumatic aortic (valve) insufficiency: Secondary | ICD-10-CM | POA: Diagnosis not present

## 2024-08-07 LAB — ECHOCARDIOGRAM COMPLETE
Area-P 1/2: 3.36 cm2
S' Lateral: 3 cm

## 2024-08-07 NOTE — Progress Notes (Signed)
 Subjective:  Patient ID: [name] GORMAN [name], male    DOB: [birth year],  MRN: [id number]  Chief Complaint  Patient presents with   Achilles tendinitis    Rm14 Patient complains of pain left achilles tendon for several says/ no improvement with prednisone.    Patient returns to clinic today for repeat evaluation of his left Achilles tendon.  At last visit, I placed him on tapered oral steroid pack.  He states that this did not help the pain.  He is continuing to see physical therapy for gait normalization, they are helping him with his Achilles tendon as well.  Review of Systems: Negative except as noted in the HPI. Denies N/V/F/Ch.  Past Medical History:  Diagnosis Date   Arthritis    Cataract    Bil/ right eye surgery   Diabetes mellitus    type II   Diverticulitis of colon    ED (erectile dysfunction)    Gout    no meds   H/O echocardiogram 2013   normal    History of irregular heartbeat    Per pt, heart skips beats at times/had Echo   Hyperlipidemia    Hypertension    Iritis, recurrent     sees Dr. Edyth at [facility]    Normal cardiac stress test 09/27/2002   Palpitations    PVCs and PACs per event monitor   Uveitis    sees Dr Hughes at [facility] Dr Maree now    Current Outpatient Medications:    allopurinol  (ZYLOPRIM ) 100 MG tablet, TAKE 1 TABLET(100 MG) BY MOUTH DAILY, Disp: 90 tablet, Rfl: 0   apixaban  (ELIQUIS ) 5 MG TABS tablet, Take 1 tablet (5 mg total) by mouth 2 (two) times daily., Disp: 180 tablet, Rfl: 3   apixaban  (ELIQUIS ) 5 MG TABS tablet, Take 1 tablet (5 mg total) by mouth 2 (two) times daily., Disp: 180 tablet, Rfl: 3   brimonidine  (ALPHAGAN ) 0.2 % ophthalmic solution, Place 1 drop into both eyes as needed. Used 1 or 2 a year, Disp: , Rfl:    brimonidine  (ALPHAGAN ) 0.2 % ophthalmic solution, Place 1 drop into both eyes 3 (three) times daily., Disp: 15 mL, Rfl: 11   brimonidine  (ALPHAGAN ) 0.2 % ophthalmic solution, Place 1 drop into both eyes 3  (three) times daily., Disp: 15 mL, Rfl: 11   colchicine  0.6 MG tablet, Take 1 tablet (0.6 mg total) by mouth every 6 (six) hours as needed (gout)., Disp: 60 tablet, Rfl: 5   colchicine  0.6 MG tablet, Take 1 tablet (0.6 mg total) by mouth every 6 (six) hours as needed for gout., Disp: 60 tablet, Rfl: 5   CONTOUR NEXT TEST test strip, USE TO TEST BLOOD SUGAR 3 TO 4 TIMES DAILY, Disp: 300 strip, Rfl: 0   finasteride  (PROSCAR ) 5 MG tablet, Take 1 tablet (5 mg total) by mouth daily., Disp: 90 tablet, Rfl: 3   fluorouracil (EFUDEX) 5 % cream, Apply topically. (Patient taking differently: Apply topically as needed.), Disp: , Rfl:    folic acid  (FOLVITE ) 1 MG tablet, Take 1 tablet (1 mg total) by mouth daily., Disp: 90 tablet, Rfl: 5   FOLIC ACID  PO, Take 10 mg by mouth daily. , Disp: , Rfl:    gabapentin  (NEURONTIN ) 300 MG capsule, TAKE 1 CAPSULE(300 MG) BY MOUTH TWICE DAILY, Disp: 180 capsule, Rfl: 3   glipiZIDE  (GLUCOTROL ) 10 MG tablet, Take 1 tablet (10 mg total) by mouth 2 (two) times daily before a meal., Disp: 180 tablet, Rfl:  3   HUMIRA  PEN 40 MG/0.4ML PNKT, SMARTSIG:40 Milligram(s) SUB-Q Every 2 Weeks, Disp: , Rfl:    lisinopril  (ZESTRIL ) 10 MG tablet, Take 1 tablet (10 mg total) by mouth daily., Disp: 90 tablet, Rfl: 3   methotrexate  (RHEUMATREX) 2.5 MG tablet, Take 4 tablets by mouth once a week., Disp: , Rfl:    methotrexate  (RHEUMATREX) 2.5 MG tablet, Take 4 tablets (10 mg total) by mouth once a week., Disp: 48 tablet, Rfl: 5   Microlet Lancets MISC, USE TO TEST DAILY, Disp: 100 each, Rfl: 1   Multiple Vitamin (MULTIVITAMIN) tablet, Take 1 tablet by mouth daily., Disp: , Rfl:    pioglitazone  (ACTOS ) 30 MG tablet, Take 1 tablet (30 mg total) by mouth daily., Disp: 90 tablet, Rfl: 3   pioglitazone  (ACTOS ) 30 MG tablet, Take 1.5 tablets (45 mg total) by mouth daily., Disp: 90 tablet, Rfl: 1   rosuvastatin  (CRESTOR ) 20 MG tablet, TAKE 1 TABLET BY MOUTH DAILY, Disp: 90 tablet, Rfl: 1   sitaGLIPtin   (JANUVIA ) 100 MG tablet, Take 1 tablet (100 mg total) by mouth daily., Disp: 30 tablet, Rfl: 5   timolol  (TIMOPTIC ) 0.5 % ophthalmic solution, 1 drop 2 (two) times daily., Disp: , Rfl:    timolol  (TIMOPTIC ) 0.5 % ophthalmic solution, Place 1 drop into both eyes daily., Disp: 15 mL, Rfl: 11   traMADol  (ULTRAM ) 50 MG tablet, Take 2 tablets (100 mg total) by mouth every 6 (six) hours as needed for severe pain (pain score 7-10)., Disp: 60 tablet, Rfl: 5   UNABLE TO FIND, C-pap using nightly, Disp: , Rfl:    zolpidem  (AMBIEN ) 10 MG tablet, TAKE 1 TABLET(10 MG) BY MOUTH AT BEDTIME (Patient taking differently: PER PATIENT TAKING 1/2 TABLET NIGHTLY), Disp: 90 tablet, Rfl: 1  Social History   Tobacco Use  Smoking Status Former   Current packs/day: 0.00   Average packs/day: 1 pack/day for 8.0 years (8.0 ttl pk-yrs)   Types: Cigarettes   Start date: 02/06/1962   Quit date: 02/06/1970   Years since quitting: 54.5  Smokeless Tobacco Never    Allergies  Allergen Reactions   Contrast Media [Iodinated Contrast Media] Hives and Itching    Hives and itching after IV contrast injection   Red Dye #40 (Allura Red)     hives   Fluorescein Other (See Comments)   Ioxaglate Itching and Hives    Hives and itching after IV contrast injection   Objective:  There were no vitals filed for this visit. There is no height or weight on file to calculate BMI. Constitutional Well developed. Well nourished.  Vascular Foot warm and well perfused. Capillary refill normal to all digits.   Neurologic Normal speech. Subjective description of burning, tingling to his digits Oriented to person, place, and time. Epicritic sensation to light touch grossly present bilaterally.  Dermatologic Skin with normal texture, turgor.  No open wounds or lesions.  Minimal erythema, edema.  No purulence.  Orthopedic: 5 out of 5 muscle strength.  Moderate pain to palpation left Achilles tendon 3 cm proximal to the insertion with mild  fusiform thickening.  Good muscle strength, no dell.   3 views of the left foot were taken today.  These do demonstrate a very small insertional Achilles heel spur.  No plantar heel spur.  No intratendinous calcifications.  Decreased joint space at the first metatarsophalangeal joint.  No other acute osseous findings such as fracture or dislocation.  Assessment:   1. Tendonitis, Achilles, left  Plan:  Patient was evaluated and treated and all questions answered. Doing well in post operative recovery.   Left midsubstance Achilles tendinitis - Discussed with the patient the diagnosis of Achilles tendinitis.  We did discuss different treatment options.  He did not experience any type of injury and has not had any difficulty ambulating. - Unfortunately the patient did not have any relief after utilizing the Medrol  Dosepak.  We did discuss different treatment options moving forward.  At this time, recommend MRI imaging to evaluate the health of his tendon and to ensure no pathology is being missed. -Depending on MRI results, I will likely have him see Dr. Silva, DPM due to his familiarity with PRP versus shockwave versus Tenex for noninsertional Achilles tendinitis. - Perform Achilles tendon strengthening and stretching and physical therapy that patient is already in. - Heel lifts dispensed today. Patient states it felt better to walk once he had them in.   RTC to see Dr. Silva after MRI is completed  Prentice Ovens, DPM

## 2024-08-14 ENCOUNTER — Other Ambulatory Visit: Payer: Self-pay | Admitting: Medical Genetics

## 2024-08-14 ENCOUNTER — Other Ambulatory Visit

## 2024-08-14 DIAGNOSIS — Z006 Encounter for examination for normal comparison and control in clinical research program: Secondary | ICD-10-CM

## 2024-08-15 ENCOUNTER — Ambulatory Visit: Payer: Self-pay

## 2024-08-15 NOTE — Telephone Encounter (Signed)
 Noted

## 2024-08-15 NOTE — Progress Notes (Signed)
 Cardiology Office Note:   Date:  08/16/2024  ID:  [name] GORMAN [name] Mickey., DOB Jul 07, 1946, MRN [id number] PCP: [name] [name] LABOR, MD  [facility] Providers Cardiologist:  [name] Schilling, MD {  History of Present Illness:   [name] [name] [name]. is a 78 y.o. male He has had palpitations with PVCs noted on monitor in 2022.  Echo has demonstrated minimal AI and MR.   At my first meeting with him recently he was noted to have atrial fib.  Monitor demonstrates that this is persistent.  He had an echo that demonstrated NL LV function with mildly elevated pulmonary pressure.    Since I saw him he has had no new symptoms.  He really does not notice his fibrillation.  Today his heart rates in the 40s but has had some documented low rates without symptoms.  On his monitor he had some bradycardia and even 3-second pauses but these were typically during the sleeping hours.  He has not had any presyncope or syncope.  He has been tolerating his blood thinner.  He has no chest pressure, neck or arm discomfort.    ROS: Positive for gout.  Otherwise as stated in the HPI negative for all other systems.  Studies Reviewed:    EKG:     NA  Risk Assessment/Calculations:    CHA2DS2-VASc Score = 3   This indicates a 3.2% annual risk of stroke. The patient's score is based upon: CHF History: 0 HTN History: 1 Diabetes History: 0 Stroke History: 0 Vascular Disease History: 0 Age Score: 2 Gender Score: 0    Physical Exam:   VS:  BP 104/64 (BP Location: Left Arm, Patient Position: Sitting, Cuff Size: Large)   Pulse (!) 42   Ht 6' 2 (1.88 m)   Wt 208 lb (94.3 kg)   SpO2 98%   BMI 26.71 kg/m    Wt Readings from Last 3 Encounters:  08/16/24 208 lb (94.3 kg)  08/16/24 211 lb 6.4 oz (95.9 kg)  08/05/24 210 lb (95.3 kg)     GEN: Well nourished, well developed in no acute distress NECK: No JVD; No carotid bruits CARDIAC: Irregular RR, no murmurs, rubs, gallops RESPIRATORY:  Clear to auscultation  without rales, wheezing or rhonchi  ABDOMEN: Soft, non-tender, non-distended EXTREMITIES: Mild leg edema; No deformity   ASSESSMENT AND PLAN:   PVCs:   He does not notice these or his PVCs.  No change in therapy.   HTN: Blood pressure is at target.  He will continue the meds as listed.    Dyslipidemia: LDL was 85 with an HDL of 56.  No change in therapy.    Elevated A1c:   A1C was 6.3.  Plan per primary provider.   Atrial fibrillation: He has fibrillation with a slow rate but he is got no symptoms related to this.  We had a long discussion about this.  I do not think there is an indication for cardioversion.  He is not having any symptoms.  He is not having any presyncope or syncope.  He is tolerating anticoagulation.  I am not considering ablation we had a long discussion about this.   The duration of this has been at least many months.  He would agree with the more conservative therapy.   Sleep apnea: He uses CPAP.  CKD: He has some mild chronic renal insufficiency and is followed by his primary provider.  Edema: He has a fair amount of edema although he is not aware  of this.  I gave him Lasix  20 mg as needed and instructions to wear compression stockings and keep his feet elevated.  He did have some elevated pulmonary pressures.  I will follow-up with echocardiography in the future and conservative measures for the edema.  Follow up with me in 6 months  Signed, [name] Schilling, MD

## 2024-08-15 NOTE — Telephone Encounter (Signed)
 FYI Only or Action Required?: FYI only for provider: appointment scheduled on tomorrow.  Patient was last seen in primary care on 08/05/2024 by [name] Garnette LABOR, MD.  Called Nurse Triage reporting Foot Swelling.  Symptoms began several weeks ago.  Interventions attempted: Rest, hydration, or home remedies.  Symptoms are: gradually worsening.  Triage Disposition: See Physician Within 24 Hours  Patient/caregiver understands and will follow disposition?: Yes, will follow disposition  Copied from CRM [id number]. Topic: Clinical - Red Word Triage >> Aug 15, 2024 11:26 AM [name] wrote: Red Word that prompted transfer to Nurse Triage: pt stated foot/ankle has been red and swollen saw another provider who suggested he be seen by pcp to have bloodwork done Reason for Disposition  [1] MODERATE leg swelling (e.g., swelling extends up to knees) AND [2] new-onset or getting worse  Answer Assessment - Initial Assessment Questions 1. ONSET: When did the swelling start? (e.g., minutes, hours, days)     About 5 weeks ago 2. LOCATION: What part of the leg is swollen?  Are both legs swollen or just one leg?     Foot and ankle 3. SEVERITY: How bad is the swelling? (e.g., localized; mild, moderate, severe) Moderate swelling 4. REDNESS: Is there redness or signs of infection?     + redness, denies fever 5. PAIN: Is the swelling painful to touch? If Yes, ask: How painful is it?   (Scale 1-10; mild, moderate or severe)     9 when walking 5 when standing 6. FEVER: Do you have a fever? If Yes, ask: What is it, how was it measured, and when did it start?      denies 7. CAUSE: What do you think is causing the leg swelling?     States that he wore a new pair of shoes and maybe bruised it 8. MEDICAL HISTORY: Do you have a history of blood clots (e.g., DVT), cancer, heart failure, kidney disease, or liver failure?     DM 9. RECURRENT SYMPTOM: Have you had leg swelling before? If Yes,  ask: When was the last time? What happened that time?     States not had this before, states did have surgery there months ago 10. OTHER SYMPTOMS: Do you have any other symptoms? (e.g., chest pain, difficulty breathing)       Denies  Has seen podiatry for this, they have referred him to PCP.  Protocols used: Leg Swelling and Edema-A-AH

## 2024-08-16 ENCOUNTER — Ambulatory Visit: Admitting: Family Medicine

## 2024-08-16 ENCOUNTER — Other Ambulatory Visit (HOSPITAL_COMMUNITY): Payer: Self-pay

## 2024-08-16 ENCOUNTER — Ambulatory Visit: Attending: Cardiology | Admitting: Cardiology

## 2024-08-16 ENCOUNTER — Ambulatory Visit: Payer: Self-pay | Admitting: Family Medicine

## 2024-08-16 ENCOUNTER — Encounter: Payer: Self-pay | Admitting: Family Medicine

## 2024-08-16 ENCOUNTER — Encounter: Payer: Self-pay | Admitting: Cardiology

## 2024-08-16 VITALS — BP 104/64 | HR 42 | Ht 74.0 in | Wt 208.0 lb

## 2024-08-16 VITALS — BP 100/50 | HR 41 | Temp 97.6°F | Ht 74.0 in | Wt 211.4 lb

## 2024-08-16 DIAGNOSIS — E785 Hyperlipidemia, unspecified: Secondary | ICD-10-CM | POA: Diagnosis not present

## 2024-08-16 DIAGNOSIS — I4819 Other persistent atrial fibrillation: Secondary | ICD-10-CM | POA: Diagnosis not present

## 2024-08-16 DIAGNOSIS — I10 Essential (primary) hypertension: Secondary | ICD-10-CM | POA: Diagnosis not present

## 2024-08-16 DIAGNOSIS — I493 Ventricular premature depolarization: Secondary | ICD-10-CM | POA: Diagnosis not present

## 2024-08-16 DIAGNOSIS — M1A9XX0 Chronic gout, unspecified, without tophus (tophi): Secondary | ICD-10-CM | POA: Diagnosis not present

## 2024-08-16 LAB — CBC WITH DIFFERENTIAL/PLATELET
Basophils Absolute: 0 K/uL (ref 0.0–0.1)
Basophils Relative: 0.6 % (ref 0.0–3.0)
Eosinophils Absolute: 0.2 K/uL (ref 0.0–0.7)
Eosinophils Relative: 2.8 % (ref 0.0–5.0)
HCT: 41.6 % (ref 39.0–52.0)
Hemoglobin: 13.8 g/dL (ref 13.0–17.0)
Lymphocytes Relative: 24 % (ref 12.0–46.0)
Lymphs Abs: 1.5 K/uL (ref 0.7–4.0)
MCHC: 33.2 g/dL (ref 30.0–36.0)
MCV: 102.1 fl — ABNORMAL HIGH (ref 78.0–100.0)
Monocytes Absolute: 0.8 K/uL (ref 0.1–1.0)
Monocytes Relative: 13.6 % — ABNORMAL HIGH (ref 3.0–12.0)
Neutro Abs: 3.7 K/uL (ref 1.4–7.7)
Neutrophils Relative %: 59 % (ref 43.0–77.0)
Platelets: 126 K/uL — ABNORMAL LOW (ref 150.0–400.0)
RBC: 4.08 Mil/uL — ABNORMAL LOW (ref 4.22–5.81)
RDW: 16.6 % — ABNORMAL HIGH (ref 11.5–15.5)
WBC: 6.2 K/uL (ref 4.0–10.5)

## 2024-08-16 LAB — URIC ACID: Uric Acid, Serum: 7.2 mg/dL (ref 4.0–7.8)

## 2024-08-16 MED ORDER — FUROSEMIDE 20 MG PO TABS
20.0000 mg | ORAL_TABLET | ORAL | 0 refills | Status: DC | PRN
  Filled 2024-08-16: qty 30, 30d supply, fill #0

## 2024-08-16 NOTE — Progress Notes (Signed)
   Subjective:    Patient ID: [name] [name]., male    DOB: 03-29-1946, 78 y.o.   MRN: [id number]  HPI Here to follow up on pain in the left foot and ankle that started several months ago. We saw him a few weeks ago, and the pain was focused on the posterior heel consistent with Achilles tendonitis. However now the pain extends around to the medial ankle area, and this area is swollen. He has been taking Colchicine  once daily for gout maintenance.    Review of Systems  Constitutional: Negative.   Respiratory: Negative.    Cardiovascular: Negative.   Musculoskeletal:  Positive for arthralgias and joint swelling.       Objective:   Physical Exam Constitutional:      Appearance: Normal appearance.  Cardiovascular:     Rate and Rhythm: Normal rate and regular rhythm.     Pulses: Normal pulses.     Heart sounds: Normal heart sounds.  Pulmonary:     Effort: Pulmonary effort is normal.     Breath sounds: Normal breath sounds.  Musculoskeletal:     Comments: The entire medial side of the left foot is swollen and quite tender. ROM is limited by pain. There is some light erythema but no warmth.   Neurological:     Mental Status: He is alert.           Assessment & Plan:  This is a gout flare, so he will increase the Colchicine  to QID until it resolves. Then he will go back to the maintenance dose. Check a CBC and uric acid level.  Garnette Olmsted, MD

## 2024-08-16 NOTE — Patient Instructions (Signed)
 Medication Instructions:  Start Lasix : take 20 mg daily as needed for leg swelling *If you need a refill on your cardiac medications before your next appointment, please call your pharmacy*  Lab Work: NONE If you have labs (blood work) drawn today and your tests are completely normal, you will receive your results only by: MyChart Message (if you have MyChart) OR A paper copy in the mail If you have any lab test that is abnormal or we need to change your treatment, we will call you to review the results.  Testing/Procedures: NONE  Follow-Up: At [facility], you and your health needs are our priority.  As part of our continuing mission to provide you with exceptional heart care, our providers are all part of one team.  This team includes your primary Cardiologist (physician) and Advanced Practice Providers or APPs (Physician Assistants and Nurse Practitioners) who all work together to provide you with the care you need, when you need it.  Your next appointment:   6 month(s)  Provider:   Lynwood Schilling, MD    We recommend signing up for the patient portal called MyChart.  Sign up information is provided on this After Visit Summary.  MyChart is used to connect with patients for Virtual Visits (Telemedicine).  Patients are able to view lab/test results, encounter notes, upcoming appointments, etc.  Non-urgent messages can be sent to your provider as well.   To learn more about what you can do with MyChart, go to [email or web address].

## 2024-08-19 ENCOUNTER — Encounter: Payer: Self-pay | Admitting: Family Medicine

## 2024-08-22 ENCOUNTER — Other Ambulatory Visit (HOSPITAL_COMMUNITY): Payer: Self-pay

## 2024-08-22 MED ORDER — METHYLPREDNISOLONE 4 MG PO TBPK
ORAL_TABLET | ORAL | 0 refills | Status: AC
  Filled 2024-08-22: qty 21, 6d supply, fill #0

## 2024-08-22 NOTE — Telephone Encounter (Signed)
 Stop the Colchicine . Instead I sent in a Medrol  dose pack for him to take

## 2024-08-24 ENCOUNTER — Other Ambulatory Visit (HOSPITAL_COMMUNITY): Payer: Self-pay

## 2024-08-24 LAB — GENECONNECT MOLECULAR SCREEN

## 2024-08-29 ENCOUNTER — Ambulatory Visit: Admitting: Family Medicine

## 2024-08-29 ENCOUNTER — Encounter: Payer: Self-pay | Admitting: Family Medicine

## 2024-08-29 VITALS — BP 108/60 | HR 43 | Temp 96.6°F | Wt 201.0 lb

## 2024-08-29 DIAGNOSIS — D696 Thrombocytopenia, unspecified: Secondary | ICD-10-CM | POA: Diagnosis not present

## 2024-08-29 NOTE — Progress Notes (Signed)
° °  Subjective:    Patient ID: [name] [name]., male    DOB: 1946-01-22, 78 y.o.   MRN: [id number]  HPI Here to review his labs from 08-16-24. His uric acid that day was normal. His CBC was remarkable for a low platelet count of 126. The WBC and Hgb were normal. Thr platelet counts started to be slightly low in March of this year. He has no bleeding issues, even though he takes Eliquis . He recently saw Dr. Lavona, who gave him Lasix  to use as needed for ankle edema. He has been seeing Prentice Ovens DPM for pain in the left Achilles tendon. This started about 2 months ago. He is applying ice with little relief. He is scheduled for an MRI on 09-11-24.    Review of Systems  Constitutional: Negative.   Respiratory: Negative.    Cardiovascular: Negative.   Musculoskeletal:  Positive for arthralgias.       Objective:   Physical Exam Constitutional:      Appearance: Normal appearance.  Cardiovascular:     Rate and Rhythm: Normal rate and regular rhythm.     Pulses: Normal pulses.     Heart sounds: Normal heart sounds.  Pulmonary:     Effort: Pulmonary effort is normal.     Breath sounds: Normal breath sounds.  Neurological:     Mental Status: He is alert.           Assessment & Plan:  He has mild thrombocytopenia, and we discussed the nature of this. We will refer him to Hematology to evaluate.  Garnette Olmsted, MD

## 2024-09-07 ENCOUNTER — Other Ambulatory Visit: Payer: Self-pay

## 2024-09-07 MED FILL — Pioglitazone HCl Tab 30 MG (Base Equiv): ORAL | 90 days supply | Qty: 90 | Fill #0 | Status: AC

## 2024-09-07 MED FILL — Lisinopril Tab 10 MG: ORAL | 90 days supply | Qty: 90 | Fill #0 | Status: AC

## 2024-09-08 ENCOUNTER — Other Ambulatory Visit (HOSPITAL_COMMUNITY): Payer: Self-pay

## 2024-09-09 ENCOUNTER — Other Ambulatory Visit: Payer: Self-pay

## 2024-09-09 ENCOUNTER — Other Ambulatory Visit (HOSPITAL_COMMUNITY): Payer: Self-pay

## 2024-09-09 ENCOUNTER — Encounter: Payer: Self-pay | Admitting: Family Medicine

## 2024-09-11 ENCOUNTER — Ambulatory Visit
Admission: RE | Admit: 2024-09-11 | Discharge: 2024-09-11 | Disposition: A | Discharge status: Home / Self Care | Source: Ambulatory Visit

## 2024-09-11 ENCOUNTER — Other Ambulatory Visit (HOSPITAL_COMMUNITY): Payer: Self-pay

## 2024-09-11 MED ORDER — GADOPICLENOL 0.5 MMOL/ML IV SOLN
9.0000 mL | Freq: Once | INTRAVENOUS | Status: AC | PRN
  Administered 2024-09-11: 9 mL via INTRAVENOUS

## 2024-09-13 ENCOUNTER — Other Ambulatory Visit: Payer: Self-pay

## 2024-09-16 ENCOUNTER — Other Ambulatory Visit: Payer: Self-pay | Admitting: Family Medicine

## 2024-09-16 ENCOUNTER — Other Ambulatory Visit (HOSPITAL_COMMUNITY): Payer: Self-pay

## 2024-09-17 ENCOUNTER — Other Ambulatory Visit (HOSPITAL_COMMUNITY): Payer: Self-pay

## 2024-09-19 ENCOUNTER — Other Ambulatory Visit (HOSPITAL_COMMUNITY): Payer: Self-pay

## 2024-09-20 ENCOUNTER — Ambulatory Visit (INDEPENDENT_AMBULATORY_CARE_PROVIDER_SITE_OTHER)

## 2024-09-20 DIAGNOSIS — S86012D Strain of left Achilles tendon, subsequent encounter: Secondary | ICD-10-CM

## 2024-09-20 DIAGNOSIS — M7662 Achilles tendinitis, left leg: Secondary | ICD-10-CM | POA: Diagnosis not present

## 2024-09-20 DIAGNOSIS — E119 Type 2 diabetes mellitus without complications: Secondary | ICD-10-CM

## 2024-09-20 NOTE — Progress Notes (Unsigned)
 Left heel boot with wedges- NWB x 2 weeks

## 2024-09-22 ENCOUNTER — Other Ambulatory Visit: Payer: Self-pay | Admitting: Cardiology

## 2024-09-22 MED FILL — Lisinopril Tab 10 MG: ORAL | 90 days supply | Qty: 90 | Fill #0 | Status: AC

## 2024-09-22 MED FILL — Pioglitazone HCl Tab 30 MG (Base Equiv): ORAL | 90 days supply | Qty: 90 | Fill #0 | Status: CN

## 2024-09-23 ENCOUNTER — Other Ambulatory Visit (HOSPITAL_COMMUNITY): Payer: Self-pay

## 2024-09-23 ENCOUNTER — Other Ambulatory Visit: Payer: Self-pay

## 2024-09-23 MED ORDER — FUROSEMIDE 20 MG PO TABS
20.0000 mg | ORAL_TABLET | ORAL | 1 refills | Status: AC | PRN
  Filled 2024-09-23: qty 90, 90d supply, fill #0
  Filled 2024-09-27: qty 90, 90d supply, fill #1

## 2024-09-24 ENCOUNTER — Other Ambulatory Visit: Payer: Self-pay

## 2024-09-24 ENCOUNTER — Other Ambulatory Visit (HOSPITAL_COMMUNITY): Payer: Self-pay

## 2024-09-24 ENCOUNTER — Encounter: Payer: Self-pay | Admitting: Pharmacist

## 2024-09-24 MED ORDER — TIMOLOL MALEATE 0.5 % OP SOLN
1.0000 [drp] | Freq: Every day | OPHTHALMIC | 11 refills | Status: AC
  Filled 2024-09-27: qty 5, 90d supply, fill #0

## 2024-09-24 MED ORDER — BRIMONIDINE TARTRATE 0.2 % OP SOLN: 1.0000 [drp] | Freq: Two times a day (BID) | OPHTHALMIC | 11 refills | Status: DC

## 2024-09-24 MED ORDER — BRIMONIDINE TARTRATE 0.2 % OP SOLN
1.0000 [drp] | Freq: Two times a day (BID) | OPHTHALMIC | 11 refills | Status: AC
  Filled 2024-09-27: qty 15, 75d supply, fill #0

## 2024-09-24 MED FILL — Pioglitazone HCl Tab 30 MG (Base Equiv): ORAL | 90 days supply | Qty: 90 | Fill #0 | Status: CN

## 2024-09-25 ENCOUNTER — Other Ambulatory Visit: Payer: Self-pay | Admitting: Family Medicine

## 2024-09-27 ENCOUNTER — Other Ambulatory Visit: Payer: Self-pay | Admitting: Family Medicine

## 2024-09-27 ENCOUNTER — Encounter (HOSPITAL_COMMUNITY): Payer: Self-pay

## 2024-09-27 ENCOUNTER — Other Ambulatory Visit: Payer: Self-pay

## 2024-09-27 ENCOUNTER — Other Ambulatory Visit (HOSPITAL_COMMUNITY): Payer: Self-pay

## 2024-09-27 MED FILL — Lancets: 90 days supply | Qty: 100 | Fill #0 | Status: AC

## 2024-09-27 MED FILL — Gabapentin Cap 300 MG: ORAL | 90 days supply | Qty: 180 | Fill #0 | Status: AC

## 2024-09-27 MED FILL — Allopurinol Tab 100 MG: ORAL | 90 days supply | Qty: 90 | Fill #0 | Status: AC

## 2024-09-27 MED FILL — Finasteride Tab 5 MG: ORAL | 90 days supply | Qty: 90 | Fill #0 | Status: AC

## 2024-09-27 MED FILL — Rosuvastatin Calcium Tab 20 MG: ORAL | 90 days supply | Qty: 90 | Fill #0 | Status: AC

## 2024-09-27 MED FILL — Pioglitazone HCl Tab 30 MG (Base Equiv): ORAL | 90 days supply | Qty: 90 | Fill #0 | Status: CN

## 2024-10-01 ENCOUNTER — Encounter: Payer: Self-pay | Admitting: Family Medicine

## 2024-10-02 ENCOUNTER — Other Ambulatory Visit (HOSPITAL_COMMUNITY): Payer: Self-pay

## 2024-10-02 ENCOUNTER — Telehealth: Payer: Self-pay

## 2024-10-02 MED ORDER — EMPAGLIFLOZIN 25 MG PO TABS
25.0000 mg | ORAL_TABLET | Freq: Every day | ORAL | 3 refills | Status: AC
  Filled 2024-10-02: qty 30, 30d supply, fill #0
  Filled 2024-10-02: qty 90, 90d supply, fill #0

## 2024-10-02 NOTE — Telephone Encounter (Signed)
 I sent in a RX for Jardiance  to take instead of Januvia 

## 2024-10-02 NOTE — Telephone Encounter (Signed)
 Pharmacy Patient Advocate Encounter   Received notification from [facility] [employer] that prior authorization for Tramadol  50 is required/requested.   Insurance verification completed.   The patient is insured through [employer].   Per test claim: PA required; PA submitted to above mentioned insurance via Latent Key/confirmation #/EOC [id number] Status is pending

## 2024-10-02 NOTE — Telephone Encounter (Signed)
 Follow up with pt's pharmacy and spoke to [facility]. She informed that pt's deduactable is meet and it costs pt $325 for 30 days supply with insurance. She continues might have asking for an alternative but no notes.   Contact pt to follow up to get more info. Left a voicemail to call us  back.

## 2024-10-03 ENCOUNTER — Other Ambulatory Visit (HOSPITAL_COMMUNITY): Payer: Self-pay

## 2024-10-04 ENCOUNTER — Other Ambulatory Visit: Payer: Self-pay | Admitting: Family Medicine

## 2024-10-04 ENCOUNTER — Encounter: Payer: Self-pay | Admitting: Family Medicine

## 2024-10-04 ENCOUNTER — Other Ambulatory Visit (HOSPITAL_COMMUNITY): Payer: Self-pay

## 2024-10-04 MED ORDER — PIOGLITAZONE HCL 30 MG PO TABS
ORAL_TABLET | ORAL | 3 refills | Status: AC
  Filled 2024-10-04: qty 135, 90d supply, fill #0

## 2024-10-04 MED FILL — Pioglitazone HCl Tab 30 MG (Base Equiv): ORAL | 90 days supply | Qty: 90 | Fill #0 | Status: CN

## 2024-10-04 NOTE — Telephone Encounter (Signed)
 Please advise the correct directions for pt Actos  Rx, pt chart states to take 1 tab 30 mg daily, pt states that he takes 1 tab 30 mg in the morning and 1/2 tab at night. Please advise

## 2024-10-04 NOTE — Telephone Encounter (Signed)
I sent in a corrected RX

## 2024-10-07 ENCOUNTER — Inpatient Hospital Stay

## 2024-10-07 ENCOUNTER — Inpatient Hospital Stay: Admitting: Oncology

## 2024-10-08 ENCOUNTER — Other Ambulatory Visit (HOSPITAL_COMMUNITY): Payer: Self-pay

## 2024-10-08 ENCOUNTER — Ambulatory Visit

## 2024-10-08 DIAGNOSIS — M7662 Achilles tendinitis, left leg: Secondary | ICD-10-CM

## 2024-10-08 DIAGNOSIS — S86012D Strain of left Achilles tendon, subsequent encounter: Secondary | ICD-10-CM | POA: Diagnosis not present

## 2024-10-08 DIAGNOSIS — E119 Type 2 diabetes mellitus without complications: Secondary | ICD-10-CM | POA: Diagnosis not present

## 2024-10-08 NOTE — Progress Notes (Signed)
 "   Subjective:  Patient ID: [name] [name], male    DOB: [birth year],  MRN: [id number]  Chief Complaint  Patient presents with   Foot Pain    Rm6 Achilles tendon rupture left foot patient says he has some improvement with cam walker.   Patient presents to clinic today for repeat evaluation of left Achilles tendon rupture.  He has been utilizing the cam walker with heel lifts for the last 2 weeks.  He has been trying to stay off of his foot is much as possible.  He does have a trip to [place] in 3 weeks.  He states that the tendon feels better.  Interval history 09/20/24: 79 year old male patient returns to clinic for review of MRI results.  I was previously treating him for Achilles tendinitis.  The MRI resulted a near full-thickness or full-thickness tear to his left Achilles.  He denies any loss of function, he states that the area is sore.  08/06/24: Patient returns to clinic today for repeat evaluation of his left Achilles tendon.  At last visit, I placed him on tapered oral steroid pack.  He states that this did not help the pain.  He is continuing to see physical therapy for gait normalization, they are helping him with his Achilles tendon as well.  Review of Systems: Negative except as noted in the HPI. Denies N/V/F/Ch.  Past Medical History:  Diagnosis Date   Arthritis    Cataract    Bil/ right eye surgery   Diabetes mellitus    type II   Diverticulitis of colon    ED (erectile dysfunction)    Gout    no meds   H/O echocardiogram 2013   normal    History of irregular heartbeat    Per pt, heart skips beats at times/had Echo   Hyperlipidemia    Hypertension    Iritis, recurrent     sees Dr. Edyth at [facility]    Normal cardiac stress test 09/27/2002   Palpitations    PVCs and PACs per event monitor   Uveitis    sees Dr Hughes at [facility] now    Current Outpatient Medications:    allopurinol  (ZYLOPRIM ) 100 MG tablet, Take 1 tablet (100 mg total) by mouth  daily., Disp: 90 tablet, Rfl: 0   apixaban  (ELIQUIS ) 5 MG TABS tablet, Take 1 tablet (5 mg total) by mouth 2 (two) times daily., Disp: 180 tablet, Rfl: 3   brimonidine  (ALPHAGAN ) 0.2 % ophthalmic solution, Place 1 drop into both eyes 2 (two) times daily., Disp: 15 mL, Rfl: 11   CONTOUR NEXT TEST test strip, USE TO TEST BLOOD SUGAR 3 TO 4 TIMES DAILY, Disp: 300 strip, Rfl: 0   empagliflozin  (JARDIANCE ) 25 MG TABS tablet, Take 1 tablet (25 mg total) by mouth daily., Disp: 90 tablet, Rfl: 3   finasteride  (PROSCAR ) 5 MG tablet, TAKE 1 TABLET(5 MG) BY MOUTH DAILY, Disp: 90 tablet, Rfl: 3   fluorouracil (EFUDEX) 5 % cream, Apply topically. (Patient taking differently: Apply topically as needed.), Disp: , Rfl:    FOLIC ACID  PO, Take 10 mg by mouth daily. , Disp: , Rfl:    furosemide  (LASIX ) 20 MG tablet, Take 1 tablet (20 mg total) by mouth as needed (Take as needed for leg swelling)., Disp: 90 tablet, Rfl: 1   gabapentin  (NEURONTIN ) 300 MG capsule, Take 1 capsule (300 mg total) by mouth 2 (two) times daily., Disp: 180 capsule, Rfl: 3   glipiZIDE  (GLUCOTROL )  10 MG tablet, Take 1 tablet (10 mg total) by mouth 2 (two) times daily before a meal., Disp: 180 tablet, Rfl: 3   HUMIRA  PEN 40 MG/0.4ML PNKT, SMARTSIG:40 Milligram(s) SUB-Q Every 2 Weeks, Disp: , Rfl:    lisinopril  (ZESTRIL ) 10 MG tablet, Take 1 tablet (10 mg total) by mouth daily., Disp: 90 tablet, Rfl: 3   meloxicam  (MOBIC ) 15 MG tablet, TAKE 1 TABLET(15 MG) BY MOUTH DAILY, Disp: 90 tablet, Rfl: 3   methotrexate  (RHEUMATREX) 2.5 MG tablet, Take 4 tablets by mouth once a week., Disp: , Rfl:    Microlet Lancets MISC, USE TO TEST DAILY, Disp: 100 each, Rfl: 1   Multiple Vitamin (MULTIVITAMIN) tablet, Take 1 tablet by mouth daily., Disp: , Rfl:    pioglitazone  (ACTOS ) 30 MG tablet, Take 1 tablet (30 mg total) by mouth in the morning AND 0.5 tablets (15 mg total) every evening., Disp: 135 tablet, Rfl: 3   rosuvastatin  (CRESTOR ) 20 MG tablet, Take 1  tablet (20 mg total) by mouth daily., Disp: 90 tablet, Rfl: 1   timolol  (TIMOPTIC ) 0.5 % ophthalmic solution, Place 1 drop into both eyes daily., Disp: 15 mL, Rfl: 11   timolol  (TIMOPTIC ) 0.5 % ophthalmic solution, Place 1 drop into both eyes daily., Disp: 15 mL, Rfl: 11   traMADol  (ULTRAM ) 50 MG tablet, Take 2 tablets (100 mg total) by mouth every 6 (six) hours as needed., Disp: 60 tablet, Rfl: 5   UNABLE TO FIND, C-pap using nightly, Disp: , Rfl:    zolpidem  (AMBIEN ) 10 MG tablet, TAKE 1 TABLET(10 MG) BY MOUTH AT BEDTIME (Patient taking differently: PER PATIENT TAKING 1/2 TABLET NIGHTLY), Disp: 90 tablet, Rfl: 1  Social History   Tobacco Use  Smoking Status Former   Current packs/day: 0.00   Average packs/day: 1 pack/day for 8.0 years (8.0 ttl pk-yrs)   Types: Cigarettes   Start date: 02/06/1962   Quit date: 02/06/1970   Years since quitting: 54.7  Smokeless Tobacco Never    Allergies  Allergen Reactions   Contrast Media [Iodinated Contrast Media] Hives and Itching    Hives and itching after IV contrast injection   Red Dye #40 (Allura Red)     hives   Fluorescein Other (See Comments)   Ioxaglate Itching and Hives    Hives and itching after IV contrast injection   Objective:  There were no vitals filed for this visit. There is no height or weight on file to calculate BMI. Constitutional Well developed. Well nourished.  Vascular Foot warm and well perfused. Capillary refill normal to all digits.   Neurologic Normal speech. Subjective description of burning, tingling to his digits Oriented to person, place, and time. Epicritic sensation to light touch grossly present bilaterally.  Dermatologic Skin with normal texture, turgor.  No open wounds or lesions.  Minimal erythema, edema.  No purulence.  Orthopedic: 5 out of 5 muscle strength.  Moderate pain to palpation left Achilles tendon 3 cm proximal to the insertion with mild fusiform thickening.  Good muscle strength.  Partial  thickness dell without complete loss of tendon continuity.  Ankle joint dorsiflexion normal.    MRI 09/10/24:  IMPRESSION 1. Near complete full-thickness tear of the distal Achilles tendon 6.8 cm proximal to the calcaneal insertion. There is an approximately 1 cm gap between the opposing torn and slightly retracted tendon stumps with complex fluid signal within the defect. The distal component of the torn Achilles tendon demonstrates diffuse thickening with heterogenous intratendinous signal. 2. Sprain  versus low-grade partial-thickness tear near the talar attachment of the anterior talofibular ligament. 3. Tibiotalar joint effusion. 4. Edema within the sinus tarsi. 5. Subcutaneous edema of the posterior, lateral, and medial ankle. More pronounced focal subcutaneous fluid/edema at the lateral ankle along the deep interface of the subcutaneous fat and underlying extensor retinaculum, hematoma cannot be excluded.  Assessment:   1. Rupture of left Achilles tendon, subsequent encounter   2. Type 2 diabetes mellitus without complication, without long-term current use of insulin  (HCC)   3. Tendonitis, Achilles, left       Plan:  Patient was evaluated and treated and all questions answered. Doing well in post operative recovery.   Left midsubstance Achilles near full thickness or full thickness tear - Patient I had extensive discussion about his left Achilles tendon rupture.  He still feels that symptomatology wise that it is more like a sprain than a rupture. -He states that he does not like using the cam walker but he is having decreased pain to his left Achilles tendon -We discussed that in 1 week he can begin removing 1 wedge out of the cam walker every week.  I discussed with him that if he will not use his cam walker while he is on vacation, he is to use heel lifts and standard supportive shoes.  I expressed the CAM walker is recommended. He will avoid using the cam walker while on  the airplane. - RTC 4 weeks. He should be down to approximately 1 heel wedge in CAM walker at that time.   Prentice Ovens, DPM  "

## 2024-10-18 ENCOUNTER — Encounter: Payer: Self-pay | Admitting: Family Medicine

## 2024-10-21 ENCOUNTER — Ambulatory Visit: Admitting: Family Medicine

## 2024-11-25 ENCOUNTER — Inpatient Hospital Stay

## 2024-11-25 ENCOUNTER — Inpatient Hospital Stay: Admitting: Oncology

## 2024-11-26 ENCOUNTER — Ambulatory Visit
